# Patient Record
Sex: Male | Born: 1965 | Race: Black or African American | Hispanic: No | Marital: Single | Smoking: Current every day smoker
Health system: Southern US, Community
[De-identification: ages and names within clinical notes are randomized; demographics above are authoritative.]

## PROBLEM LIST (undated history)

## (undated) DIAGNOSIS — I10 Essential (primary) hypertension: Secondary | ICD-10-CM

## (undated) DIAGNOSIS — E7800 Pure hypercholesterolemia, unspecified: Secondary | ICD-10-CM

## (undated) DIAGNOSIS — J020 Streptococcal pharyngitis: Secondary | ICD-10-CM

## (undated) DIAGNOSIS — E119 Type 2 diabetes mellitus without complications: Secondary | ICD-10-CM

## (undated) DIAGNOSIS — T884XXA Failed or difficult intubation, initial encounter: Secondary | ICD-10-CM

## (undated) DIAGNOSIS — H409 Unspecified glaucoma: Secondary | ICD-10-CM

## (undated) DIAGNOSIS — K219 Gastro-esophageal reflux disease without esophagitis: Secondary | ICD-10-CM

## (undated) HISTORY — DX: Streptococcal pharyngitis: J02.0

## (undated) HISTORY — DX: Essential (primary) hypertension: I10

## (undated) HISTORY — PX: HERNIA REPAIR: SHX51

## (undated) HISTORY — DX: Unspecified glaucoma: H40.9

---

## 2015-06-25 ENCOUNTER — Emergency Department
Admission: EM | Admit: 2015-06-25 | Discharge: 2015-06-25 | Discharge status: Against Medical Advice | Payer: Self-pay | Attending: Emergency Medicine | Admitting: Emergency Medicine

## 2015-06-25 ENCOUNTER — Emergency Department: Payer: Self-pay

## 2015-06-25 ENCOUNTER — Encounter: Payer: Self-pay | Admitting: Urgent Care

## 2015-06-25 ENCOUNTER — Other Ambulatory Visit: Payer: Self-pay

## 2015-06-25 DIAGNOSIS — E119 Type 2 diabetes mellitus without complications: Secondary | ICD-10-CM | POA: Insufficient documentation

## 2015-06-25 DIAGNOSIS — R0602 Shortness of breath: Secondary | ICD-10-CM | POA: Insufficient documentation

## 2015-06-25 HISTORY — DX: Pure hypercholesterolemia, unspecified: E78.00

## 2015-06-25 HISTORY — DX: Type 2 diabetes mellitus without complications: E11.9

## 2015-06-25 LAB — BASIC METABOLIC PANEL
ANION GAP: 5 (ref 5–15)
BUN: 15 mg/dL (ref 6–20)
CHLORIDE: 107 mmol/L (ref 101–111)
CO2: 25 mmol/L (ref 22–32)
Calcium: 9.1 mg/dL (ref 8.9–10.3)
Creatinine, Ser: 1 mg/dL (ref 0.61–1.24)
GFR calc Af Amer: >60 mL/min (ref >60)
Glucose, Bld: 266 mg/dL — ABNORMAL HIGH (ref 65–99)
POTASSIUM: 3.8 mmol/L (ref 3.5–5.1)
SODIUM: 137 mmol/L (ref 135–145)

## 2015-06-25 LAB — CBC
HEMATOCRIT: 41.6 % (ref 40.0–52.0)
HEMOGLOBIN: 13.3 g/dL (ref 13.0–18.0)
MCH: 23.9 pg — ABNORMAL LOW (ref 26.0–34.0)
MCHC: 32 g/dL (ref 32.0–36.0)
MCV: 74.5 fL — AB (ref 80.0–100.0)
Platelets: 217 10*3/uL (ref 150–440)
RBC: 5.59 MIL/uL (ref 4.40–5.90)
RDW: 15.5 % — AB (ref 11.5–14.5)
WBC: 8.8 10*3/uL (ref 3.8–10.6)

## 2015-06-25 LAB — TROPONIN I: Troponin I: <0.03 ng/mL (ref <0.031)

## 2015-06-25 NOTE — ED Notes (Addendum)
Patient presents with c/o worsening SOB over the last few days; worse tonight. States, I been having trouble getting it to go away." Patient with NAD noted in triage; no increased WOB.

## 2016-03-19 ENCOUNTER — Encounter: Payer: Self-pay | Admitting: Emergency Medicine

## 2016-03-19 DIAGNOSIS — H6092 Unspecified otitis externa, left ear: Secondary | ICD-10-CM | POA: Insufficient documentation

## 2016-03-19 DIAGNOSIS — E119 Type 2 diabetes mellitus without complications: Secondary | ICD-10-CM | POA: Insufficient documentation

## 2016-03-19 NOTE — ED Triage Notes (Signed)
Patient ambulatory to triage with steady gait, without difficulty or distress noted; pt reports left earache x 2 days

## 2016-03-20 ENCOUNTER — Emergency Department
Admission: EM | Admit: 2016-03-20 | Discharge: 2016-03-20 | Disposition: A | Discharge status: Home / Self Care | Payer: Self-pay | Attending: Emergency Medicine | Admitting: Emergency Medicine

## 2016-03-20 DIAGNOSIS — H9202 Otalgia, left ear: Secondary | ICD-10-CM

## 2016-03-20 DIAGNOSIS — H6092 Unspecified otitis externa, left ear: Secondary | ICD-10-CM

## 2016-03-20 MED ORDER — AZITHROMYCIN 250 MG PO TABS: ORAL_TABLET | ORAL | 0 refills | Status: DC

## 2016-03-20 MED ORDER — NEOMYCIN-COLIST-HC-THONZONIUM 3.3-3-10-0.5 MG/ML OT SUSP: 4.0000 [drp] | Freq: Once | OTIC | Status: DC

## 2016-03-20 MED ORDER — AZITHROMYCIN 500 MG PO TABS
500.0000 mg | ORAL_TABLET | Freq: Once | ORAL | Status: AC
  Administered 2016-03-20: 500 mg via ORAL
  Filled 2016-03-20: qty 1

## 2016-03-20 MED ORDER — NEOMYCIN-POLYMYXIN-HC 3.5-10000-1 OT SUSP
4.0000 [drp] | Freq: Once | OTIC | Status: AC
  Administered 2016-03-20: 4 [drp] via OTIC
  Filled 2016-03-20: qty 10

## 2016-03-20 MED ORDER — NEOMYCIN-POLYMYXIN-HC 1 % OT SOLN: 3.0000 [drp] | OTIC | 0 refills | Status: DC

## 2016-03-20 MED ORDER — IBUPROFEN 800 MG PO TABS
800.0000 mg | ORAL_TABLET | Freq: Once | ORAL | Status: AC
  Administered 2016-03-20: 800 mg via ORAL
  Filled 2016-03-20: qty 1

## 2016-03-20 NOTE — ED Provider Notes (Signed)
[facility] Emergency Department Provider Note        Time seen: ----------------------------------------- 2:14 AM on 03/20/2016 -----------------------------------------    I have reviewed the triage vital signs and the nursing notes.   HISTORY  Chief Complaint Otalgia    HPI Alexander Powers is a 50 y.o. male who presents to ER for left earache for the last 2 days. Patient states he got water in it and he was trying to clean out with a Q-tip. He states pain started after that. He denies recent illness or upper respiratory symptoms. He denies fevers or other complaints.   Past Medical History:  Diagnosis Date  . Diabetes mellitus without complication (HCC)   . Hypercholesteremia     There are no active problems to display for this patient.   History reviewed. No pertinent surgical history.  Allergies Review of patient's allergies indicates no known allergies.  Social History Social History  Substance Use Topics  . Smoking status: Never Smoker  . Smokeless tobacco: Never Used  . Alcohol use No    Review of Systems Constitutional: Negative for fever. Eyes: Negative for visual changes. ENT: Positive for left earache Skin: Negative for rash. Neurological: Negative for headaches, focal weakness or numbness. ____________________________________________   PHYSICAL EXAM:  VITAL SIGNS: ED Triage Vitals  Enc Vitals Group     BP 03/19/16 2354 (!) 111/55     Pulse Rate 03/19/16 2354 73     Resp 03/19/16 2354 18     Temp 03/19/16 2354 98.2 F (36.8 C)     Temp Source 03/19/16 2354 Oral     SpO2 03/19/16 2354 97 %     Weight 03/19/16 2353 220 lb (99.8 kg)     Height 03/19/16 2353 5\' 7"  (1.702 m)     Head Circumference --      Peak Flow --      Pain Score 03/19/16 2352 9     Pain Loc --      Pain Edu? --      Excl. in GC? --     Constitutional: Alert and oriented. Well appearing and in no distress. Eyes: Conjunctivae are normal.  PERRL. Normal extraocular movements. ENT   Head: Normocephalic and atraumatic.      Ears: Significant debris and inflammation of the left ear canal, left eardrum is retracted. Right eardrum and ear canal are normal.   Nose: No congestion/rhinnorhea.   Mouth/Throat: Mucous membranes are moist.   Neck: No stridor. Neurologic:  Normal speech and language. No gross focal neurologic deficits are appreciated.  Skin:  Skin is warm, dry and intact. No rash noted. ____________________________________________  ED COURSE:  Pertinent labs & imaging results that were available during my care of the patient were reviewed by me and considered in my medical decision making (see chart for details). Clinical Course  Patient is in no acute distress, clinically he appears to have otitis externa, possibly also otitis media. We will start Corticosporin drops here as well as oral Motrin.  Procedures ___________________________________________  FINAL ASSESSMENT AND PLAN  Otitis externa, possible otitis media  Plan: Patient with findings as dictated above. He's been started on Corticosporin, Z-Pak and Motrin. He is stable for outpatient follow-up.   Emily FilbertWilliams, Ragan Reale E, MD   Note: This dictation was prepared with Dragon dictation. Any transcriptional errors that result from this process are unintentional    Emily FilbertJonathan E Malu Pellegrini, MD 03/20/16 [phone]

## 2016-03-20 NOTE — ED Notes (Signed)
Pt discharged to home.  Family member driving.  Discharge instructions reviewed.  Verbalized understanding.  No questions or concerns at this time.  Teach back verified.  Pt in NAD.  No items left in ED.   

## 2016-03-23 ENCOUNTER — Emergency Department
Admission: EM | Admit: 2016-03-23 | Discharge: 2016-03-23 | Disposition: A | Discharge status: Home / Self Care | Payer: Self-pay | Attending: Student in an Organized Health Care Education/Training Program | Admitting: Student in an Organized Health Care Education/Training Program

## 2016-03-23 DIAGNOSIS — H66002 Acute suppurative otitis media without spontaneous rupture of ear drum, left ear: Secondary | ICD-10-CM | POA: Insufficient documentation

## 2016-03-23 DIAGNOSIS — E119 Type 2 diabetes mellitus without complications: Secondary | ICD-10-CM | POA: Insufficient documentation

## 2016-03-23 DIAGNOSIS — H6092 Unspecified otitis externa, left ear: Secondary | ICD-10-CM | POA: Insufficient documentation

## 2016-03-23 MED ORDER — CIPROFLOXACIN HCL 0.3 % OP SOLN: 2.0000 [drp] | OPHTHALMIC | 0 refills | Status: DC

## 2016-03-23 MED ORDER — AMOXICILLIN-POT CLAVULANATE 875-125 MG PO TABS: 1.0000 | ORAL_TABLET | Freq: Two times a day (BID) | ORAL | 0 refills | Status: DC

## 2016-03-23 MED ORDER — TRAMADOL HCL 50 MG PO TABS: 50.0000 mg | ORAL_TABLET | Freq: Four times a day (QID) | ORAL | 0 refills | Status: DC | PRN

## 2016-03-23 MED ORDER — CIPROFLOXACIN-DEXAMETHASONE 0.3-0.1 % OT SUSP
4.0000 [drp] | Freq: Once | OTIC | Status: AC
  Administered 2016-03-23: 4 [drp] via OTIC
  Filled 2016-03-23 (×2): qty 7.5

## 2016-03-23 NOTE — ED Provider Notes (Signed)
[facility] Emergency Department Provider Note ____________________________________________  Time seen: Approximately 8:27 PM  I have reviewed the triage vital signs and the nursing notes.   HISTORY  Chief Complaint Otalgia    HPI Su Hiltimothy Aller is a 50 y.o. male who presents to the emergency department for evaluation of left ear pain. He was evaluated hereon 03/20/2016. At that time was Powers azithromycin and antibiotic eardrops. He states that he has taken the medications as Powers, but the pain in the left ear has only worsened. He denies fever. He denies loss of hearing or tinnitus.  Past Medical History:  Diagnosis Date  . Diabetes mellitus without complication (HCC)   . Hypercholesteremia     There are no active problems to display for this patient.   No past surgical history on file.  Prior to Admission medications   Medication Sig Start Date End Date Taking? Authorizing Provider  amoxicillin-clavulanate (AUGMENTIN) 875-125 MG tablet Take 1 tablet by mouth 2 (two) times daily. 03/23/16   Chinita Pesterari B Vere Diantonio, FNP  ciprofloxacin (CILOXAN) 0.3 % ophthalmic solution Place 2 drops into the right ear every 4 (four) hours while awake. 03/23/16   Chinita Pesterari B Bryanah Sidell, FNP    Allergies Review of patient's allergies indicates no known allergies.  No family history on file.  Social History Social History  Substance Use Topics  . Smoking status: Never Smoker  . Smokeless tobacco: Never Used  . Alcohol use No    Review of Systems Constitutional: Negative  for fever/chills Eyes: No visual changes. ENT: Positive  for left  earache. Gastrointestinal: No abdominal pain.  No nausea, no vomiting.  No diarrhea.  No constipation. Musculoskeletal: Negative for pain. Skin: Negative for rash. Neurological: Negative for headaches, focal weakness or numbness. ____________________________________________   PHYSICAL EXAM:  VITAL SIGNS: ED Triage Vitals  [03/23/16 2010]  Enc Vitals Group     BP (!) 152/93     Pulse Rate 70     Resp 18     Temp 98 F (36.7 C)     Temp Source Oral     SpO2 98 %     Weight 212 lb (96.2 kg)     Height 5\' 8"  (1.727 m)     Head Circumference      Peak Flow      Pain Score 9     Pain Loc      Pain Edu?      Excl. in GC?     Constitutional: Alert and oriented. Well appearing and in no acute distress. Eyes: Conjunctivae are normal. PERRL. EOMI. Ears: Significant  pain with movement of left  auricle:; External canal erythematous;  Right TM normal; Left TM erythematous, dull, intact   Head: Atraumatic. Nose: No congestion/rhinnorhea. Mouth/Throat: Mucous membranes are moist.  Oropharynx non-erythematous. Hematological/Lymphatic/Immunilogical: No cervical lymphadenopathy. Cardiovascular: Normal capillary refill. Respiratory: Normal respiratory effort.  No retractions.  Neurologic:  Normal speech and language. No gross focal neurologic deficits are appreciated. Speech is normal. No gait instability. Skin:  Skin is warm, dry and intact. No rash noted. ____________________________________________   LABS (all labs ordered are listed, but only abnormal results are displayed)  Labs Reviewed - No data to display ____________________________________________   RADIOLOGY  Not indicated  ____________________________________________   PROCEDURES  Procedure(s) performed: None  ____________________________________________   INITIAL IMPRESSION / ASSESSMENT AND PLAN / ED COURSE  Pertinent labs & imaging results that were available during my care of the patient were reviewed by me and  considered in my medical decision making (see chart for details).  Prescriptions for Cipro drops and Augmentin as well as tramadol  will be given today.  The patient was advised to follow up with ENT  for symptoms that are not improving over the next 48 hours. He  was advised to return to the emergency department for  symptoms that change or worsen if unable to schedule an appointment.  ____________________________________________   FINAL CLINICAL IMPRESSION(S) / ED DIAGNOSES  Final diagnoses:  Otitis externa, left  Acute suppurative otitis media of left ear without spontaneous rupture of tympanic membrane, recurrence not specified    Note:  This document was prepared using Dragon voice recognition software and may include unintentional dictation errors.     Chinita PesterCari B Shyvonne Chastang, FNP 03/23/16 2049    Willy EddyPatrick Robinson, MD 03/23/16 [phone]

## 2016-03-23 NOTE — ED Triage Notes (Signed)
Patient ambulatory to triage with steady gait, without difficulty or distress noted; pt c/o left earache unrelieved by meds rx here on Friday; denies any accomp c/o

## 2016-03-23 NOTE — ED Notes (Signed)
Pt had to wait to get ear drops from pharmacy

## 2016-03-23 NOTE — ED Notes (Signed)
Discharge instructions reviewed with patient. Questions fielded by this RN. Patient verbalizes understanding of instructions. Patient discharged home in stable condition per Triplett NP. No acute distress noted at time of discharge.   

## 2016-07-31 ENCOUNTER — Ambulatory Visit
Admission: EM | Admit: 2016-07-31 | Discharge: 2016-07-31 | Disposition: A | Discharge status: Home / Self Care | Payer: 59 | Attending: Family Medicine | Admitting: Family Medicine

## 2016-07-31 ENCOUNTER — Encounter: Payer: Self-pay | Admitting: Gynecology

## 2016-07-31 DIAGNOSIS — K529 Noninfective gastroenteritis and colitis, unspecified: Secondary | ICD-10-CM | POA: Diagnosis not present

## 2016-07-31 LAB — COMPREHENSIVE METABOLIC PANEL
ALBUMIN: 3.9 g/dL (ref 3.5–5.0)
ALT: 22 U/L (ref 17–63)
ANION GAP: 6 (ref 5–15)
AST: 25 U/L (ref 15–41)
Alkaline Phosphatase: 50 U/L (ref 38–126)
BUN: 14 mg/dL (ref 6–20)
CALCIUM: 8.9 mg/dL (ref 8.9–10.3)
CHLORIDE: 107 mmol/L (ref 101–111)
CO2: 25 mmol/L (ref 22–32)
Creatinine, Ser: 0.92 mg/dL (ref 0.61–1.24)
GFR calc non Af Amer: >60 mL/min (ref >60)
GLUCOSE: 127 mg/dL — AB (ref 65–99)
POTASSIUM: 3.7 mmol/L (ref 3.5–5.1)
SODIUM: 138 mmol/L (ref 135–145)
Total Bilirubin: 0.7 mg/dL (ref 0.3–1.2)
Total Protein: 7.4 g/dL (ref 6.5–8.1)

## 2016-07-31 LAB — LIPASE, BLOOD: Lipase: 26 U/L (ref 11–51)

## 2016-07-31 MED ORDER — ONDANSETRON 8 MG PO TBDP: 8.0000 mg | ORAL_TABLET | Freq: Three times a day (TID) | ORAL | 0 refills | Status: DC | PRN

## 2016-07-31 NOTE — ED Provider Notes (Signed)
[facility]    CSN: [id number] Arrival date & time: 07/31/16  1922     History   Chief Complaint Chief Complaint  Patient presents with  . Emesis  . Diarrhea    HPI Su Hiltimothy Efferson is a 50 y.o. male.    Emesis  Associated symptoms: diarrhea and myalgias   Associated symptoms: no abdominal pain, no arthralgias, no chills, no fever, no headaches and no URI   Diarrhea  Quality:  Watery Severity:  Moderate Onset quality:  Sudden Duration:  3 days Timing:  Intermittent Progression:  Unchanged Relieved by:  None tried Associated symptoms: myalgias and vomiting   Associated symptoms: no abdominal pain, no arthralgias, no chills, no recent cough, no diaphoresis, no fever, no headaches and no URI   Risk factors: sick contacts   Risk factors: no recent antibiotic use, no suspicious food intake and no travel to endemic areas     Past Medical History:  Diagnosis Date  . Diabetes mellitus without complication (HCC)   . Hypercholesteremia     There are no active problems to display for this patient.   History reviewed. No pertinent surgical history.     Home Medications    Prior to Admission medications   Medication Sig Start Date End Date Taking? Authorizing Provider  lovastatin (MEVACOR) 20 MG tablet Take 20 mg by mouth at bedtime.   Yes Historical Provider, MD  metFORMIN (GLUCOPHAGE) 1000 MG tablet Take 1,000 mg by mouth 2 (two) times daily with a meal.   Yes Historical Provider, MD  amoxicillin-clavulanate (AUGMENTIN) 875-125 MG tablet Take 1 tablet by mouth 2 (two) times daily. 03/23/16   Chinita Pesterari B Triplett, FNP  ciprofloxacin (CILOXAN) 0.3 % ophthalmic solution Place 2 drops into the right ear every 4 (four) hours while awake. 03/23/16   Chinita Pesterari B Triplett, FNP  ondansetron (ZOFRAN ODT) 8 MG disintegrating tablet Take 1 tablet (8 mg total) by mouth every 8 (eight) hours as needed for nausea or vomiting. 07/31/16   Payton Mccallumrlando Monaca Wadas, MD  traMADol (ULTRAM) 50 MG  tablet Take 1 tablet (50 mg total) by mouth every 6 (six) hours as needed. 03/23/16   Chinita Pesterari B Triplett, FNP    Family History No family history on file.  Social History Social History  Substance Use Topics  . Smoking status: Never Smoker  . Smokeless tobacco: Never Used  . Alcohol use No     Allergies   Patient has no known allergies.   Review of Systems Review of Systems  Constitutional: Negative for chills, diaphoresis and fever.  Gastrointestinal: Positive for diarrhea and vomiting. Negative for abdominal pain.  Musculoskeletal: Positive for myalgias. Negative for arthralgias.  Neurological: Negative for headaches.     Physical Exam Triage Vital Signs ED Triage Vitals  Enc Vitals Group     BP 07/31/16 1936 (!) 146/67     Pulse Rate 07/31/16 1936 89     Resp 07/31/16 1936 16     Temp 07/31/16 1936 98.6 F (37 C)     Temp Source 07/31/16 1936 Oral     SpO2 07/31/16 1936 98 %     Weight 07/31/16 1939 213 lb (96.6 kg)     Height 07/31/16 1939 5\' 7"  (1.702 m)     Head Circumference --      Peak Flow --      Pain Score 07/31/16 1942 7     Pain Loc --      Pain Edu? --  Excl. in GC? --    No data found.   Updated Vital Signs BP (!) 146/67   Pulse 89   Temp 98.6 F (37 C) (Oral)   Resp 16   Ht 5\' 7"  (1.702 m)   Wt 213 lb (96.6 kg)   SpO2 98%   BMI 33.36 kg/m   Visual Acuity Right Eye Distance:   Left Eye Distance:   Bilateral Distance:    Right Eye Near:   Left Eye Near:    Bilateral Near:     Physical Exam  Constitutional: He is oriented to person, place, and time. He appears well-developed and well-nourished. No distress.  HENT:  Head: Normocephalic and atraumatic.  Cardiovascular: Normal rate, regular rhythm, normal heart sounds and intact distal pulses.   No murmur heard. Pulmonary/Chest: Effort normal and breath sounds normal. No respiratory distress. He has no wheezes. He has no rales.  Abdominal: Soft. Bowel sounds are normal. He  exhibits no distension and no mass. There is tenderness (mild, difffuse; no rebound or guarding). There is no rebound and no guarding.  Neurological: He is alert and oriented to person, place, and time.  Skin: No rash noted. He is not diaphoretic.  Nursing note and vitals reviewed.    UC Treatments / Results  Labs (all labs ordered are listed, but only abnormal results are displayed) Labs Reviewed  COMPREHENSIVE METABOLIC PANEL - Abnormal; Notable for the following:       Result Value   Glucose, Bld 127 (*)    All other components within normal limits  LIPASE, BLOOD    EKG  EKG Interpretation None       Radiology No results found.  Procedures Procedures (including critical care time)  Medications Ordered in UC Medications - No data to display   Initial Impression / Assessment and Plan / UC Course  I have reviewed the triage vital signs and the nursing notes.  Pertinent labs & imaging results that were available during my care of the patient were reviewed by me and considered in my medical decision making (see chart for details).  Clinical Course       Final Clinical Impressions(s) / UC Diagnoses   Final diagnoses:  Gastroenteritis, acute  (likely viral)  New Prescriptions Discharge Medication List as of 07/31/2016  8:17 PM     1. Labs results and diagnosis reviewed with patient 2. rx as per orders above; reviewed possible side effects, interactions, risks and benefits  3. Recommend supportive treatment with increased fluids/clear liquids, then advance slowly as tolerated; otc imodium AD 4. Follow-up prn if symptoms worsen or don't improve   Payton Mccallumrlando Erianna Jolly, MD 07/31/16 2221

## 2016-07-31 NOTE — Discharge Instructions (Signed)
Rest, fluids (pedialyte, gatorade, water), Imodium AD Follow up as needed if symptoms worsen or don't improve

## 2016-07-31 NOTE — ED Triage Notes (Signed)
Per patient x 3 days was put on metformin when his symptoms started. Per patient after taking his metformin the first day had 3 loose bowel movement.

## 2017-04-19 ENCOUNTER — Ambulatory Visit
Admission: EM | Admit: 2017-04-19 | Discharge: 2017-04-19 | Disposition: A | Discharge status: Home / Self Care | Payer: 59 | Attending: Family Medicine | Admitting: Family Medicine

## 2017-04-19 DIAGNOSIS — M7712 Lateral epicondylitis, left elbow: Secondary | ICD-10-CM

## 2017-04-19 DIAGNOSIS — S161XXA Strain of muscle, fascia and tendon at neck level, initial encounter: Secondary | ICD-10-CM

## 2017-04-19 MED ORDER — CYCLOBENZAPRINE HCL 10 MG PO TABS: 10.0000 mg | ORAL_TABLET | Freq: Every day | ORAL | 0 refills | Status: DC

## 2017-04-19 MED ORDER — IBUPROFEN 800 MG PO TABS: 800.0000 mg | ORAL_TABLET | Freq: Three times a day (TID) | ORAL | 0 refills | Status: DC

## 2017-04-19 MED ORDER — HYDROCODONE-ACETAMINOPHEN 5-325 MG PO TABS: ORAL_TABLET | ORAL | 0 refills | Status: DC

## 2017-04-19 NOTE — ED Triage Notes (Signed)
Patient states that he is having left arm around the elbow pain that started around 3 months ago but worsened recently. Pain with lifting certain ways.

## 2017-07-27 ENCOUNTER — Other Ambulatory Visit: Payer: Self-pay

## 2017-07-27 ENCOUNTER — Ambulatory Visit
Admission: EM | Admit: 2017-07-27 | Discharge: 2017-07-27 | Disposition: A | Discharge status: Home / Self Care | Payer: Self-pay | Attending: Family Medicine | Admitting: Family Medicine

## 2017-07-27 DIAGNOSIS — K429 Umbilical hernia without obstruction or gangrene: Secondary | ICD-10-CM

## 2017-07-27 NOTE — ED Triage Notes (Signed)
Patient complains of a knot located on his belly button that he has been noticing over the last few months. Patient occasionally notices a sharp pain in the area.

## 2017-07-27 NOTE — Discharge Instructions (Signed)
Follow-up with surgery

## 2017-08-27 NOTE — ED Provider Notes (Signed)
[facility]    CSN: [id number] Arrival date & time: 04/19/17  1935     History   Chief Complaint Chief Complaint  Patient presents with  . Arm Pain    HPI Alexander Powers is a 52 y.o. male.   52 yo male with a c/o left arm/elbow pain that started about 3 months ago. Denies any falls or injuries. Worse with gripping. Also c/o upper back, neck pain. Denies any falls or other traumatic injury.    The history is provided by the patient.  Arm Pain     Past Medical History:  Diagnosis Date  . Diabetes mellitus without complication (HCC)   . Hypercholesteremia     There are no active problems to display for this patient.   Past Surgical History:  Procedure Laterality Date  . NO PAST SURGERIES         Home Medications    Prior to Admission medications   Medication Sig Start Date End Date Taking? Authorizing Provider  lovastatin (MEVACOR) 20 MG tablet Take 20 mg by mouth at bedtime.   Yes [provider]  metFORMIN (GLUCOPHAGE) 1000 MG tablet Take 1,000 mg by mouth 2 (two) times daily with a meal.   Yes [provider]  amoxicillin-clavulanate (AUGMENTIN) 875-125 MG tablet Take 1 tablet by mouth 2 (two) times daily. 03/23/16   Triplett, Rulon Eisenmenger B, FNP  ciprofloxacin (CILOXAN) 0.3 % ophthalmic solution Place 2 drops into the right ear every 4 (four) hours while awake. 03/23/16   Triplett, Rulon Eisenmenger B, FNP  cyclobenzaprine (FLEXERIL) 10 MG tablet Take 1 tablet (10 mg total) by mouth at bedtime. 04/19/17   Payton Mccallum, MD  HYDROcodone-acetaminophen (NORCO/VICODIN) 5-325 MG tablet 1-2 tabs po bid prn 04/19/17   Payton Mccallum, MD  ibuprofen (ADVIL,MOTRIN) 800 MG tablet Take 1 tablet (800 mg total) by mouth 3 (three) times daily. 04/19/17   Payton Mccallum, MD  ondansetron (ZOFRAN ODT) 8 MG disintegrating tablet Take 1 tablet (8 mg total) by mouth every 8 (eight) hours as needed for nausea or vomiting. 07/31/16   Payton Mccallum, MD  traMADol (ULTRAM) 50  MG tablet Take 1 tablet (50 mg total) by mouth every 6 (six) hours as needed. 03/23/16   Chinita Pester, FNP    Family History Family History  Problem Relation Age of Onset  . Diabetes Father     Social History Social History   Tobacco Use  . Smoking status: Never Smoker  . Smokeless tobacco: Never Used  Substance Use Topics  . Alcohol use: Yes    Comment: occasionally  . Drug use: No     Allergies   Patient has no known allergies.   Review of Systems Review of Systems   Physical Exam Triage Vital Signs ED Triage Vitals [04/19/17 2015]  Enc Vitals Group     BP (!) 157/90     Pulse Rate 87     Resp 18     Temp 98.4 F (36.9 C)     Temp Source Oral     SpO2 98 %     Weight 215 lb (97.5 kg)     Height 5' 7.5" (1.715 m)     Head Circumference      Peak Flow      Pain Score 9     Pain Loc      Pain Edu?      Excl. in GC?    No data found.  Updated Vital Signs BP (!) 157/90 (  BP Location: Left Arm)   Pulse 87   Temp 98.4 F (36.9 C) (Oral)   Resp 18   Ht 5' 7.5" (1.715 m)   Wt 215 lb (97.5 kg)   SpO2 98%   BMI 33.18 kg/m   Visual Acuity Right Eye Distance:   Left Eye Distance:   Bilateral Distance:    Right Eye Near:   Left Eye Near:    Bilateral Near:     Physical Exam  Constitutional: He appears well-developed and well-nourished. No distress.  Musculoskeletal:       Left forearm: He exhibits tenderness. He exhibits no bony tenderness, no swelling, no edema, no deformity and no laceration.  Over the lateral epicondyle  Skin: He is not diaphoretic.  Nursing note and vitals reviewed.    UC Treatments / Results  Labs (all labs ordered are listed, but only abnormal results are displayed) Labs Reviewed - No data to display  EKG  EKG Interpretation None       Radiology No results found.  Procedures Procedures (including critical care time)  Medications Ordered in UC Medications - No data to display   Initial Impression /  Assessment and Plan / UC Course  I have reviewed the triage vital signs and the nursing notes.  Pertinent labs & imaging results that were available during my care of the patient were reviewed by me and considered in my medical decision making (see chart for details).       Final Clinical Impressions(s) / UC Diagnoses   Final diagnoses:  Lateral epicondylitis of left elbow  Strain of neck muscle, initial encounter    ED Discharge Orders        Ordered    ibuprofen (ADVIL,MOTRIN) 800 MG tablet  3 times daily     04/19/17 2102    cyclobenzaprine (FLEXERIL) 10 MG tablet  Daily at bedtime     04/19/17 2102    HYDROcodone-acetaminophen (NORCO/VICODIN) 5-325 MG tablet     04/19/17 2102     1.diagnosis reviewed with patient 2. rx as per orders above; reviewed possible side effects, interactions, risks and benefits  3. Recommend supportive treatment with rest, ice 4. Follow-up prn if symptoms worsen or don't improve   Controlled Substance Prescriptions [place] Controlled Substance Registry consulted? Not Applicable   Payton Mccallumonty, Makylie Rivere, MD 08/27/17 1015

## 2017-09-13 LAB — HM HEPATITIS C SCREENING LAB: HM Hepatitis Screen: NEGATIVE

## 2017-10-07 ENCOUNTER — Encounter: Payer: Self-pay | Admitting: Surgery

## 2017-10-07 ENCOUNTER — Ambulatory Visit (INDEPENDENT_AMBULATORY_CARE_PROVIDER_SITE_OTHER): Payer: Commercial Managed Care - PPO | Admitting: Surgery

## 2017-10-07 VITALS — BP 167/105 | HR 75 | Temp 98.3°F | Ht 70.0 in | Wt 204.0 lb

## 2017-10-07 DIAGNOSIS — K429 Umbilical hernia without obstruction or gangrene: Secondary | ICD-10-CM | POA: Diagnosis not present

## 2017-10-07 NOTE — Patient Instructions (Signed)
You have requested for your Umbilical Hernia be repaired. This has been scheduled on 10/22/2017 by Dr. Satira MccallumJason Davis at [facility].  Please see your (blue)pre-care sheet for information.  You will need to arrange to be off work for 1-2 weeks but will have to have a lifting restriction of no more than 15 lbs for 6 weeks following your surgery.   Umbilical Hernia, Adult A hernia is a bulge of tissue that pushes through an opening between muscles. An umbilical hernia happens in the abdomen, near the belly button (umbilicus). The hernia may contain tissues from the small intestine, large intestine, or fatty tissue covering the intestines (omentum). Umbilical hernias in adults tend to get worse over time, and they require surgical treatment. There are several types of umbilical hernias. You may have:  A hernia located just above or below the umbilicus (indirect hernia). This is the most common type of umbilical hernia in adults.  A hernia that forms through an opening formed by the umbilicus (direct hernia).  A hernia that comes and goes (reducible hernia). A reducible hernia may be visible only when you strain, lift something heavy, or cough. This type of hernia can be pushed back into the abdomen (reduced).  A hernia that traps abdominal tissue inside the hernia (incarcerated hernia). This type of hernia cannot be reduced.  A hernia that cuts off blood flow to the tissues inside the hernia (strangulated hernia). The tissues can start to die if this happens. This type of hernia requires emergency treatment.  What are the causes? An umbilical hernia happens when tissue inside the abdomen presses on a weak area of the abdominal muscles. What increases the risk? You may have a greater risk of this condition if you:  Are obese.  Have had several pregnancies.  Have a buildup of fluid inside your abdomen (ascites).  Have had surgery that weakens the abdominal muscles.  What are the signs  or symptoms? The main symptom of this condition is a painless bulge at or near the belly button. A reducible hernia may be visible only when you strain, lift something heavy, or cough. Other symptoms may include:  Dull pain.  A feeling of pressure.  Symptoms of a strangulated hernia may include:  Pain that gets increasingly worse.  Nausea and vomiting.  Pain when pressing on the hernia.  Skin over the hernia becoming red or purple.  Constipation.  Blood in the stool.  How is this diagnosed? This condition may be diagnosed based on:  A physical exam. You may be asked to cough or strain while standing. These actions increase the pressure inside your abdomen and force the hernia through the opening in your muscles. Your health care provider may try to reduce the hernia by pressing on it.  Your symptoms and medical history.  How is this treated? Surgery is the only treatment for an umbilical hernia. Surgery for a strangulated hernia is done as soon as possible. If you have a small hernia that is not incarcerated, you may need to lose weight before having surgery. Follow these instructions at home:  Lose weight, if told by your health care provider.  Do not try to push the hernia back in.  Watch your hernia for any changes in color or size. Tell your health care provider if any changes occur.  You may need to avoid activities that increase pressure on your hernia.  Do not lift anything that is heavier than 10 lb (4.5 kg) until your health  care provider says that this is safe.  Take over-the-counter and prescription medicines only as told by your health care provider.  Keep all follow-up visits as told by your health care provider. This is important. Contact a health care provider if:  Your hernia gets larger.  Your hernia becomes painful. Get help right away if:  You develop sudden, severe pain near the area of your hernia.  You have pain as well as nausea or  vomiting.  You have pain and the skin over your hernia changes color.  You develop a fever. This information is not intended to replace advice given to you by your health care provider. Make sure you discuss any questions you have with your health care provider. Document Released: 12/27/2015 Document Revised: 03/29/2016 Document Reviewed: 12/27/2015 Elsevier Interactive Patient Education  [employer].

## 2017-10-07 NOTE — H&P (View-Only) (Signed)
Surgical Clinic History and Physical  Referring provider:  No referring provider defined for this encounter.  HISTORY OF PRESENT ILLNESS (HPI):  52 y.o. male presents for evaluation of his umbilical hernia. Patient reports he first noticed it after feeling "a pop" while working out 6 months ago, since which time he has decreased exercising due to pain/discomfort associated with his umbilical hernia. He describes his umbilical hernia has always since reduced spontaneously when he lays down (such as in bed at night), but he says he would like to have it fixed, particularly so he can play with his grandchildren without pain or fear. Patient otherwise reports his blood glucose has been better controlled since his medications were changed, and he denies abdominal distention, constipation, diarrhea, N/V, straining with urination, frequent cough, CP, or SOB. Of note, patient also says he is able to walk more than 1 - 2 blocks and up/down a flight of steps without needing to stop or experiencing any CP or SOB.  PAST MEDICAL HISTORY (PMH):  Past Medical History:  Diagnosis Date  . Diabetes mellitus without complication (HCC)   . HTN (hypertension)   . Hypercholesteremia      PAST SURGICAL HISTORY (PSH):  Past Surgical History:  Procedure Laterality Date  . NO PAST SURGERIES       MEDICATIONS:  Prior to Admission medications   Medication Sig Start Date End Date Taking? Authorizing Provider  glipiZIDE (GLUCOTROL XL) 10 MG 24 hr tablet Take 10 mg by mouth daily with breakfast.   Yes [provider]  lovastatin (MEVACOR) 20 MG tablet Take 20 mg by mouth at bedtime.   Yes [provider]  metFORMIN (GLUCOPHAGE) 1000 MG tablet Take 1,000 mg by mouth 3 (three) times daily with meals.   Yes [provider]  metoprolol tartrate (LOPRESSOR) 25 MG tablet Take 25 mg by mouth 2 (two) times daily.   Yes [provider]     ALLERGIES:  No Known Allergies   SOCIAL  HISTORY:  Social History   Socioeconomic History  . Marital status: Divorced    Spouse name: Not on file  . Number of children: Not on file  . Years of education: Not on file  . Highest education level: Not on file  Social Needs  . Financial resource strain: Not on file  . Food insecurity - worry: Not on file  . Food insecurity - inability: Not on file  . Transportation needs - medical: Not on file  . Transportation needs - non-medical: Not on file  Occupational History  . Not on file  Tobacco Use  . Smoking status: Never Smoker  . Smokeless tobacco: Never Used  Substance and Sexual Activity  . Alcohol use: Yes    Comment: occasionally  . Drug use: No  . Sexual activity: Not on file  Other Topics Concern  . Not on file  Social History Narrative  . Not on file    The patient currently resides (home / rehab facility / nursing home): Home The patient normally is (ambulatory / bedbound): Ambulatory  FAMILY HISTORY:  Family History  Problem Relation Age of Onset  . Diabetes Father     Otherwise negative/non-contributory.  REVIEW OF SYSTEMS:  Constitutional: denies any other weight loss, fever, chills, or sweats  Eyes: denies any other vision changes, history of eye injury  ENT: denies sore throat, hearing problems  Respiratory: denies shortness of breath, wheezing  Cardiovascular: denies chest pain, palpitations  Gastrointestinal: abdominal pain, N/V, and bowel  function as per HPI Musculoskeletal: denies any other joint pains or cramps  Skin: Denies any other rashes or skin discolorations Neurological: denies any other headache, dizziness, weakness  Psychiatric: Denies any other depression, anxiety   All other review of systems were otherwise negative   VITAL SIGNS:  @VSRANGES @     Height: 5\' 10"  (177.8 cm) Weight: 204 lb (92.5 kg) BMI (Calculated): 29.27   PHYSICAL EXAM:  Constitutional:  -- Average body habitus  -- Awake, alert, and oriented x3  Eyes:  --  Pupils equally round and reactive to light  -- No scleral icterus  Ear, nose, throat:  -- No jugular venous distension -- No nasal drainage, bleeding Pulmonary:  -- No crackles  -- Equal breath sounds bilaterally -- Breathing non-labored at rest Cardiovascular:  -- S1, S2 present  -- No pericardial rubs  Gastrointestinal:  -- Easily reducible moderately tender umbilical hernia -- Abdomen soft, otherwise nontender, and non-distended without guarding/rebound tenderness -- No other abdominal masses appreciated, pulsatile or otherwise  Musculoskeletal and Integumentary:  -- Wounds or skin discoloration: None appreciated -- Extremities: B/L UE and LE FROM, hands and feet warm, no edema  Neurologic:  -- Motor function: Intact and symmetric -- Sensation: Intact and symmetric  Labs:  CBC:  Lab Results  Component Value Date   WBC 8.8 06/25/2015   RBC 5.59 06/25/2015   BMP:  Lab Results  Component Value Date   GLUCOSE 127 (H) 07/31/2016   CO2 25 07/31/2016   BUN 14 07/31/2016   CREATININE 0.92 07/31/2016   CALCIUM 8.9 07/31/2016     Imaging studies: No pertinent imaging studies available for review   Assessment/Plan:  52 y.o. male with a symptomatic reducible umbilical hernia, complicated by co-morbidities including DM, HTN, and hypercholesterolemia.   - reviewed strategies regarding manual self-reduction of umbilical hernia  - signs and symptoms of hernia incarceration and bowel obstruction discussed  - all risks, benefits, and alternatives to open repair of symptomatic reducible umbilical hernia with mesh were discussed with the patient and his wife, all of their questions were answered to their expressed satisfaction, patient expresses he wishes to proceed, and informed consent was obtained.  - will plan for open repair of supraumbilical hernia with mesh on Friday, March 15th per patient request  - anticipate return to clinic 2 weeks after above elective procedure  -  instructed to call if any questions or concerns  All of the above recommendations were discussed with the patient and patient's wife, and all of patient's and family's questions were answered to their expressed satisfaction.  Thank you for the opportunity to participate in this patient's care.  -- Scherrie Gerlach Earlene Plater, MD, RPVI : [facility] General [facility] - Partnering for exceptional care. Office: [phone]

## 2017-10-07 NOTE — Progress Notes (Signed)
Surgical Clinic History and Physical  Referring provider:  No referring provider defined for this encounter.  HISTORY OF PRESENT ILLNESS (HPI):  52 y.o. male presents for evaluation of his umbilical hernia. Patient reports he first noticed it after feeling "a pop" while working out 6 months ago, since which time he has decreased exercising due to pain/discomfort associated with his umbilical hernia. He describes his umbilical hernia has always since reduced spontaneously when he lays down (such as in bed at night), but he says he would like to have it fixed, particularly so he can play with his grandchildren without pain or fear. Patient otherwise reports his blood glucose has been better controlled since his medications were changed, and he denies abdominal distention, constipation, diarrhea, N/V, straining with urination, frequent cough, CP, or SOB. Of note, patient also says he is able to walk more than 1 - 2 blocks and up/down a flight of steps without needing to stop or experiencing any CP or SOB.  PAST MEDICAL HISTORY (PMH):  Past Medical History:  Diagnosis Date  . Diabetes mellitus without complication (HCC)   . HTN (hypertension)   . Hypercholesteremia      PAST SURGICAL HISTORY (PSH):  Past Surgical History:  Procedure Laterality Date  . NO PAST SURGERIES       MEDICATIONS:  Prior to Admission medications   Medication Sig Start Date End Date Taking? Authorizing Provider  glipiZIDE (GLUCOTROL XL) 10 MG 24 hr tablet Take 10 mg by mouth daily with breakfast.   Yes [provider]  lovastatin (MEVACOR) 20 MG tablet Take 20 mg by mouth at bedtime.   Yes [provider]  metFORMIN (GLUCOPHAGE) 1000 MG tablet Take 1,000 mg by mouth 3 (three) times daily with meals.   Yes [provider]  metoprolol tartrate (LOPRESSOR) 25 MG tablet Take 25 mg by mouth 2 (two) times daily.   Yes [provider]     ALLERGIES:  No Known Allergies   SOCIAL  HISTORY:  Social History   Socioeconomic History  . Marital status: Divorced    Spouse name: Not on file  . Number of children: Not on file  . Years of education: Not on file  . Highest education level: Not on file  Social Needs  . Financial resource strain: Not on file  . Food insecurity - worry: Not on file  . Food insecurity - inability: Not on file  . Transportation needs - medical: Not on file  . Transportation needs - non-medical: Not on file  Occupational History  . Not on file  Tobacco Use  . Smoking status: Never Smoker  . Smokeless tobacco: Never Used  Substance and Sexual Activity  . Alcohol use: Yes    Comment: occasionally  . Drug use: No  . Sexual activity: Not on file  Other Topics Concern  . Not on file  Social History Narrative  . Not on file    The patient currently resides (home / rehab facility / nursing home): Home The patient normally is (ambulatory / bedbound): Ambulatory  FAMILY HISTORY:  Family History  Problem Relation Age of Onset  . Diabetes Father     Otherwise negative/non-contributory.  REVIEW OF SYSTEMS:  Constitutional: denies any other weight loss, fever, chills, or sweats  Eyes: denies any other vision changes, history of eye injury  ENT: denies sore throat, hearing problems  Respiratory: denies shortness of breath, wheezing  Cardiovascular: denies chest pain, palpitations  Gastrointestinal: abdominal pain, N/V, and bowel  function as per HPI Musculoskeletal: denies any other joint pains or cramps  Skin: Denies any other rashes or skin discolorations Neurological: denies any other headache, dizziness, weakness  Psychiatric: Denies any other depression, anxiety   All other review of systems were otherwise negative   VITAL SIGNS:  @VSRANGES @     Height: 5\' 10"  (177.8 cm) Weight: 204 lb (92.5 kg) BMI (Calculated): 29.27   PHYSICAL EXAM:  Constitutional:  -- Average body habitus  -- Awake, alert, and oriented x3  Eyes:  --  Pupils equally round and reactive to light  -- No scleral icterus  Ear, nose, throat:  -- No jugular venous distension -- No nasal drainage, bleeding Pulmonary:  -- No crackles  -- Equal breath sounds bilaterally -- Breathing non-labored at rest Cardiovascular:  -- S1, S2 present  -- No pericardial rubs  Gastrointestinal:  -- Easily reducible moderately tender umbilical hernia -- Abdomen soft, otherwise nontender, and non-distended without guarding/rebound tenderness -- No other abdominal masses appreciated, pulsatile or otherwise  Musculoskeletal and Integumentary:  -- Wounds or skin discoloration: None appreciated -- Extremities: B/L UE and LE FROM, hands and feet warm, no edema  Neurologic:  -- Motor function: Intact and symmetric -- Sensation: Intact and symmetric  Labs:  CBC:  Lab Results  Component Value Date   WBC 8.8 06/25/2015   RBC 5.59 06/25/2015   BMP:  Lab Results  Component Value Date   GLUCOSE 127 (H) 07/31/2016   CO2 25 07/31/2016   BUN 14 07/31/2016   CREATININE 0.92 07/31/2016   CALCIUM 8.9 07/31/2016     Imaging studies: No pertinent imaging studies available for review   Assessment/Plan:  52 y.o. male with a symptomatic reducible umbilical hernia, complicated by co-morbidities including DM, HTN, and hypercholesterolemia.   - reviewed strategies regarding manual self-reduction of umbilical hernia  - signs and symptoms of hernia incarceration and bowel obstruction discussed  - all risks, benefits, and alternatives to open repair of symptomatic reducible umbilical hernia with mesh were discussed with the patient and his wife, all of their questions were answered to their expressed satisfaction, patient expresses he wishes to proceed, and informed consent was obtained.  - will plan for open repair of supraumbilical hernia with mesh on Friday, March 15th per patient request  - anticipate return to clinic 2 weeks after above elective procedure  -  instructed to call if any questions or concerns  All of the above recommendations were discussed with the patient and patient's wife, and all of patient's and family's questions were answered to their expressed satisfaction.  Thank you for the opportunity to participate in this patient's care.  -- Scherrie Gerlach Earlene Plater, MD, RPVI [place]: [facility] General Surgery - Partnering for exceptional care. Office: [phone]

## 2017-10-08 ENCOUNTER — Encounter: Payer: Self-pay | Admitting: Surgery

## 2017-10-12 ENCOUNTER — Telehealth: Payer: Self-pay | Admitting: Surgery

## 2017-10-12 NOTE — Telephone Encounter (Signed)
I have called patient and significant other to discuss surgery date. Both phone lines were busy. I was not able to leave a message.   pre op date/time and sx date. Sx: 10/22/17 with Dr Manfred Shirtsavis--open umbilical hernia repair with mesh.  Pre op: 10/15/17 between 9-1:00-phone interview.   Patient made aware to call [phone], between 1-3:00pm the day before surgery, to find out what time to arrive.

## 2017-10-14 ENCOUNTER — Ambulatory Visit
Admission: EM | Admit: 2017-10-14 | Discharge: 2017-10-14 | Disposition: A | Discharge status: Home / Self Care | Payer: Commercial Managed Care - PPO | Attending: Family Medicine | Admitting: Family Medicine

## 2017-10-14 DIAGNOSIS — K429 Umbilical hernia without obstruction or gangrene: Secondary | ICD-10-CM | POA: Diagnosis not present

## 2017-10-14 MED ORDER — ONDANSETRON 8 MG PO TBDP: 8.0000 mg | ORAL_TABLET | Freq: Two times a day (BID) | ORAL | 0 refills | Status: DC

## 2017-10-14 NOTE — ED Triage Notes (Signed)
Reports umbilical hernia for "6 months".  When eats makes him sick to stomach.  Family reports noticing it a "little more poked out".   Has surgical apppointment 3/15.

## 2017-10-14 NOTE — ED Provider Notes (Signed)
[facility]    CSN: [id number] Arrival date & time: 10/14/17  1825     History   Chief Complaint Chief Complaint  Patient presents with  . Abdominal Pain    HPI Alexander Powers is a 52 y.o. male.   HPI  52 year old male presents for local hernia that has had for 6 months.  Been evaluated and scheduled for surgery by Dr. Earlene Plateravis general surgeon for 10/22/2017.  Recently he states whenever he eats makes him sick to his stomach.  Family reports that his herniation appears to be poked out more.  Not specifically complain of pain.  No diarrhea or change in his bowel movements.  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  Had no fever or chills.  He is afebrile today.      Past Medical History:  Diagnosis Date  . Diabetes mellitus without complication (HCC)   . HTN (hypertension)   . Hypercholesteremia     There are no active problems to display for this patient.   Past Surgical History:  Procedure Laterality Date  . NO PAST SURGERIES         Home Medications    Prior to Admission medications   Medication Sig Start Date End Date Taking? Authorizing Provider  glipiZIDE (GLUCOTROL XL) 10 MG 24 hr tablet Take 10 mg by mouth daily with breakfast.   Yes [provider]  lovastatin (MEVACOR) 20 MG tablet Take 20 mg by mouth at bedtime.   Yes [provider]  metFORMIN (GLUCOPHAGE) 1000 MG tablet Take 1,000 mg by mouth 3 (three) times daily with meals.   Yes [provider]  ondansetron (ZOFRAN ODT) 8 MG disintegrating tablet Take 1 tablet (8 mg total) by mouth 2 (two) times daily. 10/14/17   Lutricia Feiloemer, Amellia Panik P, PA-C    Family History Family History  Problem Relation Age of Onset  . Diabetes Father     Social History Social History   Tobacco Use  . Smoking status: Never Smoker  . Smokeless tobacco: Never Used  Substance Use Topics  . Alcohol use: Yes    Comment: occasionally  . Drug use: No     Allergies   Patient has no known allergies.   Review of Systems Review of Systems  Constitutional: Positive for activity change. Negative for appetite change, chills, fatigue and fever.  Gastrointestinal: Positive for abdominal pain, nausea and vomiting. Negative for abdominal distention, constipation and diarrhea.  All other systems reviewed and are negative.    Physical Exam Triage Vital Signs ED Triage Vitals  Enc Vitals Group     BP 10/14/17 1857 (!) 150/89     Pulse Rate 10/14/17 1857 85     Resp 10/14/17 1857 16     Temp 10/14/17 1857 98.3 F (36.8 C)     Temp Source 10/14/17 1857 Oral     SpO2 10/14/17 1857 98 %     Weight --      Height --      Head Circumference --      Peak Flow --      Pain Score 10/14/17 1859 8     Pain Loc --      Pain Edu? --      Excl. in GC? --    No data found.  Updated Vital Signs BP (!) 150/89   Pulse 85   Temp 98.3 F (36.8 C) (  Oral)   Resp 16   SpO2 98%   Visual Acuity Right Eye Distance:   Left Eye Distance:   Bilateral Distance:    Right Eye Near:   Left Eye Near:    Bilateral Near:     Physical Exam  Constitutional: He is oriented to person, place, and time. He appears well-developed and well-nourished.  Non-toxic appearance. He does not appear ill. No distress.  HENT:  Head: Normocephalic.  Eyes: Pupils are equal, round, and reactive to light.  Abdominal: Soft. Normal appearance and bowel sounds are normal. There is tenderness in the periumbilical area. There is no rigidity, no rebound, no guarding and negative Murphy's sign. A hernia is present.  Neurological: He is alert and oriented to person, place, and time.  Skin: Skin is warm and dry.  Psychiatric: He has a normal mood and affect. His behavior is normal.  Nursing note and vitals reviewed.    UC Treatments / Results  Labs (all labs ordered are listed, but only abnormal results are displayed) Labs Reviewed  - No data to display  EKG  EKG Interpretation None       Radiology No results found.  Procedures Procedures (including critical care time)  Medications Ordered in UC Medications - No data to display   Initial Impression / Assessment and Plan / UC Course  I have reviewed the triage vital signs and the nursing notes.  Pertinent labs & imaging results that were available during my care of the patient were reviewed by me and considered in my medical decision making (see chart for details).     Plan: 1. Test/x-ray results and diagnosis reviewed with patient 2. rx as per orders; risks, benefits, potential side effects reviewed with patient 3. Recommend supportive treatment with Zofran for nausea as needed.  Notify your surgeon tomorrow of your symptoms.  Have continuing and unrelenting belly pain go emergency room immediately. 4. F/u prn if symptoms worsen or don't improve   Final Clinical Impressions(s) / UC Diagnoses   Final diagnoses:  Umbilical hernia without obstruction and without gangrene    ED Discharge Orders        Ordered    ondansetron (ZOFRAN ODT) 8 MG disintegrating tablet  2 times daily     10/14/17 1951       Controlled Substance Prescriptions [place] Controlled Substance Registry consulted? Not Applicable   Lutricia Feil, PA-C 10/14/17 2013

## 2017-10-14 NOTE — ED Notes (Signed)
Pt in treatment room.  In NAD.  Needs address.  Pt/Fam updated on POC.   

## 2017-10-14 NOTE — Discharge Instructions (Signed)
Contact your surgeon tomorrow notify him of your symptoms.

## 2017-10-15 ENCOUNTER — Encounter
Admission: RE | Admit: 2017-10-15 | Discharge: 2017-10-15 | Disposition: A | Discharge status: Home / Self Care | Payer: Commercial Managed Care - PPO | Source: Ambulatory Visit | Attending: Surgery | Admitting: Surgery

## 2017-10-15 ENCOUNTER — Other Ambulatory Visit: Payer: Self-pay

## 2017-10-15 HISTORY — DX: Gastro-esophageal reflux disease without esophagitis: K21.9

## 2017-10-15 NOTE — Patient Instructions (Signed)
Your procedure is scheduled on: 10-22-17 FRIDAY Report to Same Day Surgery 2nd floor medical mall [facility](Medical Mall Entrance-take elevator on left to 2nd floor.  Check in with surgery information desk.) To find out your arrival time please call [phone] between 1PM - 3PM on 10-21-17 THURSDAY  Remember: Instructions that are not followed completely may result in serious medical risk, up to and including death, or upon the discretion of your surgeon and anesthesiologist your surgery may need to be rescheduled.    _x___ 1. Do not eat food after midnight the night before your procedure. You may drink clear liquids up to 2 hours before you are scheduled to arrive at the hospital for your procedure.  Do not drink clear liquids within 2 hours of your scheduled arrival to the hospital.  Clear liquids include  --Water or Apple juice without pulp  --Clear carbohydrate beverage such as ClearFast or Gatorade  --Black Coffee or Clear Tea (No milk, no creamers, do not add anything to the coffee or Tea Type 1 and type 2 diabetics should only drink water.  No gum chewing or hard candies.     __x__ 2. No Alcohol for 24 hours before or after surgery.   __x__3. No Smoking or e-cigarettes for 24 prior to surgery.  Do not use any chewable tobacco products for at least 6 hour prior to surgery   ____  4. Bring all medications with you on the day of surgery if instructed.    __x__ 5. Notify your doctor if there is any change in your medical condition     (cold, fever, infections).    x___6. On the morning of surgery brush your teeth with toothpaste and water.  You may rinse your mouth with mouth wash if you wish.  Do not swallow any toothpaste or mouthwash.   Do not wear jewelry, make-up, hairpins, clips or nail polish.  Do not wear lotions, powders, or perfumes. You may wear deodorant.  Do not shave 48 hours prior to surgery. Men may shave face and neck.  Do not bring valuables to the hospital.    [facility] is not responsible for any belongings or valuables.               Contacts, dentures or bridgework may not be worn into surgery.  Leave your suitcase in the car. After surgery it may be brought to your room.  For patients admitted to the hospital, discharge time is determined by your treatment team.  _  Patients discharged the day of surgery will not be allowed to drive home.  You will need someone to drive you home and stay with you the night of your procedure.    Please read over the following fact sheets that you were given:   [facility] Preparing for [facility] and or MRSA Information   _x___ TAKE THE FOLLOWING MEDICATION THE MORNING OF SURGERY WITH A SMALL SIP OF WATER. These include:  1. NONE  2.  3.  4.  5.  6.  ____Fleets enema or Magnesium Citrate as directed.   _x___ Use CHG Soap or sage wipes as directed on instruction sheet   ____ Use inhalers on the day of surgery and bring to hospital day of surgery  _X___ Stop Metformin 2 days prior to surgery-LAST DOSE ON Tuesday, MARCH 12TH    ____ Take 1/2 of usual insulin dose the night before surgery and none on the morning surgery.   ____ Follow recommendations from Cardiologist,  Pulmonologist or PCP regarding  stopping Aspirin, Coumadin, Plavix ,Eliquis, Effient, or Pradaxa, and Pletal.  X____Stop Anti-inflammatories such as Advil, Aleve, Ibuprofen, Motrin, Naproxen, Naprosyn, Goodies powders or aspirin products NOW-OK to take Tylenol    ____ Stop supplements until after surgery   ____ Bring C-Pap to the hospital.

## 2017-10-15 NOTE — Telephone Encounter (Signed)
Patient has called back and all information has been given. Patient understands all information.   A new update phone number has entered and I have advised patient to call Pre Admission testing today.

## 2017-10-19 ENCOUNTER — Encounter
Admission: RE | Admit: 2017-10-19 | Discharge: 2017-10-19 | Disposition: A | Discharge status: Home / Self Care | Payer: Commercial Managed Care - PPO | Source: Ambulatory Visit | Attending: Surgery | Admitting: Surgery

## 2017-10-19 ENCOUNTER — Ambulatory Visit
Admission: RE | Admit: 2017-10-19 | Discharge: 2017-10-19 | Disposition: A | Discharge status: Home / Self Care | Payer: Commercial Managed Care - PPO | Source: Ambulatory Visit | Attending: Surgery | Admitting: Surgery

## 2017-10-19 DIAGNOSIS — Z01810 Encounter for preprocedural cardiovascular examination: Secondary | ICD-10-CM | POA: Insufficient documentation

## 2017-10-19 DIAGNOSIS — J9811 Atelectasis: Secondary | ICD-10-CM | POA: Diagnosis not present

## 2017-10-19 DIAGNOSIS — Q791 Other congenital malformations of diaphragm: Secondary | ICD-10-CM | POA: Insufficient documentation

## 2017-10-19 DIAGNOSIS — Z01811 Encounter for preprocedural respiratory examination: Secondary | ICD-10-CM

## 2017-10-19 DIAGNOSIS — Z01812 Encounter for preprocedural laboratory examination: Secondary | ICD-10-CM | POA: Insufficient documentation

## 2017-10-19 DIAGNOSIS — K429 Umbilical hernia without obstruction or gangrene: Secondary | ICD-10-CM | POA: Diagnosis not present

## 2017-10-19 DIAGNOSIS — Z01818 Encounter for other preprocedural examination: Secondary | ICD-10-CM | POA: Diagnosis not present

## 2017-10-19 LAB — CBC WITH DIFFERENTIAL/PLATELET
Basophils Absolute: 0 10*3/uL (ref 0–0.1)
Basophils Relative: 0 %
EOS PCT: 2 %
Eosinophils Absolute: 0.1 10*3/uL (ref 0–0.7)
HCT: 43.2 % (ref 40.0–52.0)
HEMOGLOBIN: 14 g/dL (ref 13.0–18.0)
LYMPHS PCT: 24 %
Lymphs Abs: 1.7 10*3/uL (ref 1.0–3.6)
MCH: 24.8 pg — AB (ref 26.0–34.0)
MCHC: 32.4 g/dL (ref 32.0–36.0)
MCV: 76.6 fL — AB (ref 80.0–100.0)
MONOS PCT: 6 %
Monocytes Absolute: 0.4 10*3/uL (ref 0.2–1.0)
Neutro Abs: 4.9 10*3/uL (ref 1.4–6.5)
Neutrophils Relative %: 68 %
PLATELETS: 273 10*3/uL (ref 150–440)
RBC: 5.63 MIL/uL (ref 4.40–5.90)
RDW: 14.5 % (ref 11.5–14.5)
WBC: 7.2 10*3/uL (ref 3.8–10.6)

## 2017-10-19 LAB — BASIC METABOLIC PANEL
Anion gap: 6 (ref 5–15)
BUN: 11 mg/dL (ref 6–20)
CHLORIDE: 104 mmol/L (ref 101–111)
CO2: 28 mmol/L (ref 22–32)
CREATININE: 0.86 mg/dL (ref 0.61–1.24)
Calcium: 9.1 mg/dL (ref 8.9–10.3)
GFR calc Af Amer: >60 mL/min (ref >60)
GFR calc non Af Amer: >60 mL/min (ref >60)
Glucose, Bld: 142 mg/dL — ABNORMAL HIGH (ref 65–99)
POTASSIUM: 3.6 mmol/L (ref 3.5–5.1)
SODIUM: 138 mmol/L (ref 135–145)

## 2017-10-19 LAB — HEMOGLOBIN A1C
Hgb A1c MFr Bld: 11.8 % — ABNORMAL HIGH (ref 4.8–5.6)
Mean Plasma Glucose: 291.96 mg/dL

## 2017-10-21 MED ORDER — CEFAZOLIN SODIUM-DEXTROSE 2-4 GM/100ML-% IV SOLN: 2.0000 g | INTRAVENOUS | Status: DC

## 2017-10-21 NOTE — Pre-Procedure Instructions (Signed)
CALLED DR Priscella MannPENWARDEN REGARDING CXR THAT SHOWED LEFT HEMIDIAGHRAGM ELEVATION WITH LEFT MILD BASILAR ATELECTASIS-OK TO PROCEED PER DR Priscella MannPENWARDEN

## 2017-10-22 ENCOUNTER — Encounter: Payer: Self-pay | Admitting: *Deleted

## 2017-10-22 ENCOUNTER — Ambulatory Visit: Payer: Commercial Managed Care - PPO | Admitting: Anesthesiology

## 2017-10-22 ENCOUNTER — Other Ambulatory Visit: Payer: Self-pay

## 2017-10-22 ENCOUNTER — Encounter
Admission: RE | Disposition: A | Discharge status: Home / Self Care | Payer: Self-pay | Source: Ambulatory Visit | Attending: Surgery

## 2017-10-22 ENCOUNTER — Ambulatory Visit
Admission: RE | Admit: 2017-10-22 | Discharge: 2017-10-22 | Disposition: A | Discharge status: Home / Self Care | Payer: Commercial Managed Care - PPO | Source: Ambulatory Visit | Attending: Surgery | Admitting: Surgery

## 2017-10-22 DIAGNOSIS — Z7984 Long term (current) use of oral hypoglycemic drugs: Secondary | ICD-10-CM | POA: Insufficient documentation

## 2017-10-22 DIAGNOSIS — E7800 Pure hypercholesterolemia, unspecified: Secondary | ICD-10-CM | POA: Diagnosis not present

## 2017-10-22 DIAGNOSIS — Z87891 Personal history of nicotine dependence: Secondary | ICD-10-CM | POA: Diagnosis not present

## 2017-10-22 DIAGNOSIS — Z6831 Body mass index (BMI) 31.0-31.9, adult: Secondary | ICD-10-CM | POA: Insufficient documentation

## 2017-10-22 DIAGNOSIS — I10 Essential (primary) hypertension: Secondary | ICD-10-CM | POA: Diagnosis not present

## 2017-10-22 DIAGNOSIS — E119 Type 2 diabetes mellitus without complications: Secondary | ICD-10-CM | POA: Diagnosis not present

## 2017-10-22 DIAGNOSIS — E669 Obesity, unspecified: Secondary | ICD-10-CM | POA: Diagnosis not present

## 2017-10-22 DIAGNOSIS — K219 Gastro-esophageal reflux disease without esophagitis: Secondary | ICD-10-CM | POA: Insufficient documentation

## 2017-10-22 DIAGNOSIS — Z79899 Other long term (current) drug therapy: Secondary | ICD-10-CM | POA: Diagnosis not present

## 2017-10-22 DIAGNOSIS — K429 Umbilical hernia without obstruction or gangrene: Secondary | ICD-10-CM

## 2017-10-22 HISTORY — PX: INSERTION OF MESH: SHX5868

## 2017-10-22 HISTORY — PX: UMBILICAL HERNIA REPAIR: SHX196

## 2017-10-22 HISTORY — DX: Failed or difficult intubation, initial encounter: T88.4XXA

## 2017-10-22 LAB — GLUCOSE, CAPILLARY
Glucose-Capillary: 161 mg/dL — ABNORMAL HIGH (ref 65–99)
Glucose-Capillary: 205 mg/dL — ABNORMAL HIGH (ref 65–99)

## 2017-10-22 SURGERY — REPAIR, HERNIA, UMBILICAL, ADULT
Anesthesia: General | Site: Abdomen | Wound class: Clean

## 2017-10-22 MED ORDER — CHLORHEXIDINE GLUCONATE CLOTH 2 % EX PADS: 6.0000 | MEDICATED_PAD | Freq: Once | CUTANEOUS | Status: DC

## 2017-10-22 MED ORDER — ROCURONIUM BROMIDE 100 MG/10ML IV SOLN
INTRAVENOUS | Status: DC | PRN
  Administered 2017-10-22: 20 mg via INTRAVENOUS
  Administered 2017-10-22: 50 mg via INTRAVENOUS

## 2017-10-22 MED ORDER — OXYCODONE HCL 5 MG PO TABS
5.0000 mg | ORAL_TABLET | Freq: Once | ORAL | Status: AC | PRN
  Administered 2017-10-22: 5 mg via ORAL

## 2017-10-22 MED ORDER — CEFAZOLIN SODIUM-DEXTROSE 2-4 GM/100ML-% IV SOLN
INTRAVENOUS | Status: AC
  Filled 2017-10-22: qty 100

## 2017-10-22 MED ORDER — ROCURONIUM BROMIDE 50 MG/5ML IV SOLN
INTRAVENOUS | Status: AC
  Filled 2017-10-22: qty 1

## 2017-10-22 MED ORDER — ACETAMINOPHEN 500 MG PO TABS
ORAL_TABLET | ORAL | Status: AC
  Administered 2017-10-22: 1000 mg via ORAL
  Filled 2017-10-22: qty 2

## 2017-10-22 MED ORDER — FAMOTIDINE 20 MG PO TABS
20.0000 mg | ORAL_TABLET | Freq: Once | ORAL | Status: AC
  Administered 2017-10-22: 20 mg via ORAL

## 2017-10-22 MED ORDER — GABAPENTIN 300 MG PO CAPS
ORAL_CAPSULE | ORAL | Status: AC
Start: 2017-10-22 — End: 2017-10-22
  Administered 2017-10-22: 300 mg via ORAL
  Filled 2017-10-22: qty 1

## 2017-10-22 MED ORDER — FENTANYL CITRATE (PF) 100 MCG/2ML IJ SOLN
INTRAMUSCULAR | Status: AC
  Filled 2017-10-22: qty 2

## 2017-10-22 MED ORDER — MIDAZOLAM HCL 2 MG/2ML IJ SOLN
INTRAMUSCULAR | Status: AC
  Filled 2017-10-22: qty 2

## 2017-10-22 MED ORDER — GABAPENTIN 300 MG PO CAPS
300.0000 mg | ORAL_CAPSULE | ORAL | Status: AC
  Administered 2017-10-22: 300 mg via ORAL

## 2017-10-22 MED ORDER — SUGAMMADEX SODIUM 200 MG/2ML IV SOLN
INTRAVENOUS | Status: AC
  Filled 2017-10-22: qty 2

## 2017-10-22 MED ORDER — BUPIVACAINE HCL (PF) 0.5 % IJ SOLN
INTRAMUSCULAR | Status: AC
  Filled 2017-10-22: qty 30

## 2017-10-22 MED ORDER — PROPOFOL 10 MG/ML IV BOLUS
INTRAVENOUS | Status: AC
  Filled 2017-10-22: qty 20

## 2017-10-22 MED ORDER — PHENYLEPHRINE HCL 10 MG/ML IJ SOLN
INTRAMUSCULAR | Status: AC
  Filled 2017-10-22: qty 1

## 2017-10-22 MED ORDER — SUGAMMADEX SODIUM 200 MG/2ML IV SOLN
INTRAVENOUS | Status: DC | PRN
  Administered 2017-10-22: 200 mg via INTRAVENOUS

## 2017-10-22 MED ORDER — ONDANSETRON HCL 4 MG/2ML IJ SOLN
INTRAMUSCULAR | Status: DC | PRN
  Administered 2017-10-22: 4 mg via INTRAVENOUS

## 2017-10-22 MED ORDER — ACETAMINOPHEN 500 MG PO TABS
1000.0000 mg | ORAL_TABLET | ORAL | Status: AC
  Administered 2017-10-22: 1000 mg via ORAL

## 2017-10-22 MED ORDER — LIDOCAINE HCL 1 % IJ SOLN
INTRAMUSCULAR | Status: DC | PRN
  Administered 2017-10-22: 10 mL

## 2017-10-22 MED ORDER — FENTANYL CITRATE (PF) 100 MCG/2ML IJ SOLN
25.0000 ug | INTRAMUSCULAR | Status: DC | PRN
  Administered 2017-10-22 (×3): 25 ug via INTRAVENOUS

## 2017-10-22 MED ORDER — EPHEDRINE SULFATE 50 MG/ML IJ SOLN
INTRAMUSCULAR | Status: DC | PRN
  Administered 2017-10-22: 10 mg via INTRAVENOUS

## 2017-10-22 MED ORDER — PROPOFOL 10 MG/ML IV BOLUS
INTRAVENOUS | Status: DC | PRN
  Administered 2017-10-22: 200 mg via INTRAVENOUS

## 2017-10-22 MED ORDER — OXYCODONE HCL 5 MG/5ML PO SOLN: 5.0000 mg | Freq: Once | ORAL | Status: AC | PRN

## 2017-10-22 MED ORDER — PHENYLEPHRINE HCL 10 MG/ML IJ SOLN
INTRAMUSCULAR | Status: DC | PRN
  Administered 2017-10-22: 100 ug via INTRAVENOUS

## 2017-10-22 MED ORDER — OXYCODONE HCL 5 MG PO TABS
ORAL_TABLET | ORAL | Status: AC
  Filled 2017-10-22: qty 1

## 2017-10-22 MED ORDER — FENTANYL CITRATE (PF) 100 MCG/2ML IJ SOLN
INTRAMUSCULAR | Status: DC | PRN
  Administered 2017-10-22: 25 ug via INTRAVENOUS
  Administered 2017-10-22: 100 ug via INTRAVENOUS
  Administered 2017-10-22: 50 ug via INTRAVENOUS
  Administered 2017-10-22: 25 ug via INTRAVENOUS
  Administered 2017-10-22: 50 ug via INTRAVENOUS

## 2017-10-22 MED ORDER — LIDOCAINE HCL (CARDIAC) 20 MG/ML IV SOLN
INTRAVENOUS | Status: DC | PRN
  Administered 2017-10-22: 40 mg via INTRAVENOUS

## 2017-10-22 MED ORDER — FENTANYL CITRATE (PF) 100 MCG/2ML IJ SOLN
INTRAMUSCULAR | Status: AC
  Administered 2017-10-22: 25 ug via INTRAVENOUS
  Filled 2017-10-22: qty 2

## 2017-10-22 MED ORDER — LIDOCAINE HCL (PF) 1 % IJ SOLN
INTRAMUSCULAR | Status: AC
  Filled 2017-10-22: qty 30

## 2017-10-22 MED ORDER — SODIUM CHLORIDE 0.9 % IV SOLN: INTRAVENOUS | Status: DC

## 2017-10-22 MED ORDER — MIDAZOLAM HCL 2 MG/2ML IJ SOLN
INTRAMUSCULAR | Status: DC | PRN
  Administered 2017-10-22: 2 mg via INTRAVENOUS

## 2017-10-22 MED ORDER — FAMOTIDINE 20 MG PO TABS
ORAL_TABLET | ORAL | Status: AC
  Administered 2017-10-22: 20 mg via ORAL
  Filled 2017-10-22: qty 1

## 2017-10-22 MED ORDER — OXYCODONE-ACETAMINOPHEN 5-325 MG PO TABS: 1.0000 | ORAL_TABLET | ORAL | 0 refills | Status: DC | PRN

## 2017-10-22 MED ORDER — LACTATED RINGERS IV SOLN
INTRAVENOUS | Status: DC | PRN
  Administered 2017-10-22: 09:00:00 via INTRAVENOUS

## 2017-10-22 SURGICAL SUPPLY — 29 items
BLADE SURG 15 STRL LF DISP TIS (BLADE) ×1 IMPLANT
BLADE SURG 15 STRL SS (BLADE) ×1
CHLORAPREP W/TINT 26ML (MISCELLANEOUS) ×2 IMPLANT
DECANTER SPIKE VIAL GLASS SM (MISCELLANEOUS) ×4 IMPLANT
DERMABOND ADVANCED (GAUZE/BANDAGES/DRESSINGS) ×1
DERMABOND ADVANCED .7 DNX12 (GAUZE/BANDAGES/DRESSINGS) ×1 IMPLANT
DRAPE LAPAROTOMY 77X122 PED (DRAPES) ×2 IMPLANT
ELECT REM PT RETURN 9FT ADLT (ELECTROSURGICAL) ×2
ELECTRODE REM PT RTRN 9FT ADLT (ELECTROSURGICAL) ×1 IMPLANT
GLOVE BIO SURGEON STRL SZ7 (GLOVE) ×2 IMPLANT
GLOVE BIOGEL PI IND STRL 7.5 (GLOVE) ×1 IMPLANT
GLOVE BIOGEL PI INDICATOR 7.5 (GLOVE) ×1
KIT TURNOVER KIT A (KITS) ×2 IMPLANT
MESH VENTRALEX ST 2.5 CRC MED (Mesh General) ×2 IMPLANT
NEEDLE HYPO 25X1 1.5 SAFETY (NEEDLE) ×2 IMPLANT
NS IRRIG 1000ML POUR BTL (IV SOLUTION) ×2 IMPLANT
PACK BASIN MINOR ARMC (MISCELLANEOUS) ×2 IMPLANT
SUT ETHIBOND CT1 BRD 2-0 30IN (SUTURE) IMPLANT
SUT ETHIBOND NAB MO 7 #0 18IN (SUTURE) ×2 IMPLANT
SUT MNCRL AB 4-0 PS2 18 (SUTURE) ×2 IMPLANT
SUT VIC AB 2-0 CT2 27 (SUTURE) IMPLANT
SUT VIC AB 2-0 SH 27 (SUTURE) ×1
SUT VIC AB 2-0 SH 27XBRD (SUTURE) ×1 IMPLANT
SUT VIC AB 3-0 54X BRD REEL (SUTURE) IMPLANT
SUT VIC AB 3-0 BRD 54 (SUTURE)
SUT VIC AB 3-0 SH 27 (SUTURE)
SUT VIC AB 3-0 SH 27X BRD (SUTURE) IMPLANT
SYR 10ML LL (SYRINGE) ×2 IMPLANT
SYR BULB IRRIG 60ML STRL (SYRINGE) IMPLANT

## 2017-10-22 NOTE — Interval H&P Note (Signed)
History and Physical Interval Note:  10/22/2017 8:21 AM  Alexander Powers  has presented today for surgery, with the diagnosis of UMBILICAL HERNIA  The various methods of treatment have been discussed with the patient and family. After consideration of risks, benefits and other options for treatment, the patient has consented to  Procedure(s): HERNIA REPAIR UMBILICAL ADULT WITH MESH (N/A) INSERTION OF MESH (N/A) as a surgical intervention .  The patient's history has been reviewed, patient examined, no change in status, stable for surgery.  I have reviewed the patient's chart and labs.  Questions were answered to the patient's satisfaction.     Ancil LinseyJason Evan Ludy Messamore

## 2017-10-22 NOTE — Anesthesia Preprocedure Evaluation (Signed)
Anesthesia Evaluation  Patient identified by MRN, date of birth, ID band Patient awake    Reviewed: Allergy & Precautions, H&P , NPO status , Patient's Chart, lab work & pertinent test results  History of Anesthesia Complications Negative for: history of anesthetic complications  Airway Mallampati: I  TM Distance: >3 FB Neck ROM: full    Dental  (+) Chipped, Poor Dentition   Pulmonary neg shortness of breath, former smoker,           Cardiovascular Exercise Tolerance: Good (-) angina(-) Past MI and (-) DOE negative cardio ROS       Neuro/Psych negative neurological ROS  negative psych ROS   GI/Hepatic Neg liver ROS, GERD  Medicated and Controlled,  Endo/Other  diabetes, Type 2  Renal/GU      Musculoskeletal   Abdominal   Peds  Hematology negative hematology ROS (+)   Anesthesia Other Findings Past Medical History: No date: Diabetes mellitus without complication (HCC) No date: GERD (gastroesophageal reflux disease)     Comment:  OCC- TUMS No date: Hypercholesteremia  Past Surgical History: No date: NO PAST SURGERIES  BMI    Body Mass Index:  31.32 kg/m      Reproductive/Obstetrics negative OB ROS                             Anesthesia Physical Anesthesia Plan  ASA: III  Anesthesia Plan: General ETT   Post-op Pain Management:    Induction: Intravenous  PONV Risk Score and Plan: Ondansetron, Dexamethasone and Midazolam  Airway Management Planned: Oral ETT  Additional Equipment:   Intra-op Plan:   Post-operative Plan: Extubation in OR  Informed Consent: I have reviewed the patients History and Physical, chart, labs and discussed the procedure including the risks, benefits and alternatives for the proposed anesthesia with the patient or authorized representative who has indicated his/her understanding and acceptance.   Dental Advisory Given  Plan Discussed with:  Anesthesiologist, CRNA and Surgeon  Anesthesia Plan Comments: (Patient consented for risks of anesthesia including but not limited to:  - adverse reactions to medications - damage to teeth, lips or other oral mucosa - sore throat or hoarseness - Damage to heart, brain, lungs or loss of life  Patient voiced understanding.)        Anesthesia Quick Evaluation

## 2017-10-22 NOTE — Anesthesia Procedure Notes (Signed)
Performed by: Khalia Gong, CRNA       

## 2017-10-22 NOTE — Anesthesia Procedure Notes (Signed)
Procedure Name: Intubation Date/Time: 10/22/2017 9:15 AM Performed by: Allean Found, CRNA Pre-anesthesia Checklist: Patient identified, Emergency Drugs available, Suction available, Patient being monitored and Timeout performed Patient Re-evaluated:Patient Re-evaluated prior to induction Oxygen Delivery Method: Circle system utilized Preoxygenation: Pre-oxygenation with 100% oxygen Induction Type: IV induction Ventilation: Mask ventilation with difficulty Laryngoscope Size: Mac, McGraph and 4 Grade View: Grade II Tube type: Oral Tube size: 7.5 mm Number of attempts: 2 Airway Equipment and Method: Stylet Placement Confirmation: ETT inserted through vocal cords under direct vision,  positive ETCO2 and breath sounds checked- equal and bilateral Secured at: 24 cm Tube secured with: Tape Dental Injury: Teeth and Oropharynx as per pre-operative assessment  Difficulty Due To: Difficulty was anticipated Comments: Difficult mask due to facial hair.  Caused mask leak, although I could ventilate.  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

## 2017-10-22 NOTE — Transfer of Care (Signed)
Immediate Anesthesia Transfer of Care Note  Patient: Alexander Powers  Procedure(s) Performed: HERNIA REPAIR UMBILICAL ADULT WITH MESH (N/A Abdomen) INSERTION OF MESH (N/A Abdomen)  Patient Location: PACU  Anesthesia Type:General  Level of Consciousness: awake  Airway & Oxygen Therapy: Patient Spontanous Breathing and Patient connected to face mask oxygen  Post-op Assessment: Report given to RN and Post -op Vital signs reviewed and stable  Post vital signs: Reviewed and stable  Last Vitals:  Vitals:   10/22/17 0811 10/22/17 0821  BP: (!) 159/104 (!) 156/50  Pulse: 73 76  Resp: 14   Temp: 36.7 C   SpO2: 100%     Last Pain:  Vitals:   10/22/17 0811  TempSrc: Oral  PainSc: 0-No pain         Complications: No apparent anesthesia complications

## 2017-10-22 NOTE — Op Note (Addendum)
SURGICAL OPERATIVE REPORT  DATE OF PROCEDURE: 10/22/2017  ATTENDING Surgeon(s): Ancil Linseyavis, Diamond Jentz Evan, MD  ASSISTANT: Ross Ludwigestiny Walters, PA-S  ANESTHESIA: GETA  PRE-OPERATIVE DIAGNOSIS: Symptomatic (painful) reducible umbilical hernia (icd-10: K42.9)  POST-OPERATIVE DIAGNOSIS: Symptomatic (painful) reducible umbilical hernia (icd-10: K42.9)  PROCEDURE(S):  1.) Open repair of symptomatic (painful) umbilical hernia with mesh (cpt: [id number])  INTRAOPERATIVE FINDINGS: 2 cm umbilical hernia with adherent omentum and hernia sac  INTRAVENOUS FLUIDS: 850 mL crystalloid   ESTIMATED BLOOD LOSS: Minimal (<20 mL)   URINE OUTPUT: No Foley catheter  SPECIMENS: None  IMPLANTS: 6.4 cm Bard Ventralex ST ventral hernia repair mesh  DRAINS: None  COMPLICATIONS: None apparent  CONDITION AT END OF PROCEDURE: Hemodynamically stable and extubated  DISPOSITION OF PATIENT: PACU  INDICATIONS FOR PROCEDURE:  Patient is a 52 y.o. obese only recently controlled diabetic male who presented upon referral from his PMD for evaluation of patient's umbilical hernia. He denies abdominal distention, change in bowel function, or N/V and likewise deniesconstipation, straining with urination, or coughing and expressed that he'd like to have it repaired. All risks, benefits, and alternatives to above elective procedure were discussed with the patient, all of patient's questions were answered to his expressed satisfaction, and informed consent was obtained and documented accordingly.  DETAILS OF PROCEDURE: Patient was brought to the operating suite and appropriately identified. General anesthesia was administered along with appropriate pre-operative antibiotics, and endotracheal intubation was performed by anesthetist. In supine position, operative site was prepped and draped in the usual sterile fashion, and following a brief time out, local anesthetic was injected inferior to the umbilicus, and a 3 cm transverse  curvilinear incision was made using a #15 blade scalpel and extended deep through subcutaneous tissues using blunt dissection and electrocautery. The hernia sac was then dissected from the undersurface of the umbilical skin and stalk, and the fascial defect was cleared of all omental adhesions. A 6.4 cm Bard Ventralex ST mesh patch was selected, inserted, confirmed to approximate well to fascial edges without intervening viscera or omentum, and patch was secured without tension using Ethibond braided non-absorbable suture. The umbilicus was then invaginated using 2-0 Vicryl suture from the dermis to fascia, and the wound was irrigated using sterile saline. Overlying tissues were re-approximated in layers using buried interrupted 3-0 Vicryl suture for dermis and running subcuticular 4-0 Monocryl suture to re-approximate skin, which was cleaned and dried and sterile Dermabond skin glue was applied and allowed to dry.  Patient was then safely able to be extubated, awakened, and transferred to PACU for post-operative monitoring and care.  I was present for all aspects of the above procedure, and there were no complications apparent.

## 2017-10-22 NOTE — Anesthesia Postprocedure Evaluation (Signed)
Anesthesia Post Note  Patient: Alexander Powers  Procedure(s) Performed: HERNIA REPAIR UMBILICAL ADULT WITH MESH (N/A Abdomen) INSERTION OF MESH (N/A Abdomen)  Patient location during evaluation: PACU Anesthesia Type: General Level of consciousness: awake and alert Pain management: pain level controlled Vital Signs Assessment: post-procedure vital signs reviewed and stable Respiratory status: spontaneous breathing, nonlabored ventilation, respiratory function stable and patient connected to nasal cannula oxygen Cardiovascular status: blood pressure returned to baseline and stable Postop Assessment: no apparent nausea or vomiting Anesthetic complications: no     Last Vitals:  Vitals:   10/22/17 1215 10/22/17 1230  BP: 134/78   Pulse: 74 (P) 73  Resp: 12 (P) 16  Temp:  (P) 36.6 C  SpO2: 92% (P) 98%    Last Pain:  Vitals:   10/22/17 1230  TempSrc: (P) Oral  PainSc:                  Cleda MccreedyJoseph K Bernita Beckstrom

## 2017-10-22 NOTE — Discharge Instructions (Addendum)
In addition to included general post-operative instructions for Open Repair of Umbilical Hernia Repair with Mesh,  Diet: Resume home heart healthy diet.   Activity: No heavy lifting >15 pounds (children, pets, laundry, garbage) or strenuous activity until follow-up (no heavy lifting >40 lbs for total of 4 - 6 weeks), but light activity and walking are encouraged. Do not drive or drink alcohol if taking narcotic pain medications.  Wound care: 2 days after surgery (Sunday, 3/17), may shower/get incision wet with soapy water and pat dry (do not rub incisions), but no baths or submerging incision underwater until follow-up.   Medications: Resume all home medications. For mild to moderate pain: acetaminophen (Tylenol) or ibuprofen/naproxen (if no kidney disease). Combining Tylenol with alcohol can substantially increase your risk of causing liver disease. Narcotic pain medications, if prescribed, can be used for severe pain, though may cause nausea, constipation, and drowsiness. Do not combine Tylenol and Percocet (or similar) within a 6 hour period as Percocet (and similar) contain(s) Tylenol. If you do not need the narcotic pain medication, you do not need to fill the prescription.  AMBULATORY SURGERY  DISCHARGE INSTRUCTIONS   1) The drugs that you were given will stay in your system until tomorrow so for the next 24 hours you should not:  A) Drive an automobile B) Make any legal decisions C) Drink any alcoholic beverage   2) You may resume regular meals tomorrow.  Today it is better to start with liquids and gradually work up to solid foods.  You may eat anything you prefer, but it is better to start with liquids, then soup and crackers, and gradually work up to solid foods.   3) Please notify your doctor immediately if you have any unusual bleeding, trouble breathing, redness and pain at the surgery site, drainage, fever, or pain not relieved by medication.    4) Additional  Instructions:  Please contact your physician with any problems or Same Day Surgery at [phone], Monday through Friday 6 am to 4 pm, or [place] at [facility] Main number at [phone].  Call office [phone]) at any time if any questions, worsening pain, fevers/chills, bleeding, drainage from incision site, or other concerns.

## 2017-10-22 NOTE — Progress Notes (Signed)
Dr. Randa NgoPiscitello notified of pt CBG: 205. Dr. Randa NgoPiscitello stated not to give insulin as pt is not insulin dependent at home and that pt stated will be following up with endocrinology. Riaz Onorato E 11:39 AM 10/22/2017

## 2017-10-22 NOTE — Anesthesia Post-op Follow-up Note (Signed)
Anesthesia QCDR form completed.        

## 2017-10-31 DIAGNOSIS — K429 Umbilical hernia without obstruction or gangrene: Secondary | ICD-10-CM

## 2017-11-02 DIAGNOSIS — E1165 Type 2 diabetes mellitus with hyperglycemia: Secondary | ICD-10-CM | POA: Diagnosis not present

## 2017-11-04 ENCOUNTER — Encounter: Payer: Self-pay | Admitting: Surgery

## 2017-11-04 ENCOUNTER — Ambulatory Visit (INDEPENDENT_AMBULATORY_CARE_PROVIDER_SITE_OTHER): Payer: Commercial Managed Care - PPO | Admitting: Surgery

## 2017-11-04 VITALS — BP 142/83 | HR 92 | Temp 98.3°F

## 2017-11-04 DIAGNOSIS — K4090 Unilateral inguinal hernia, without obstruction or gangrene, not specified as recurrent: Secondary | ICD-10-CM | POA: Diagnosis not present

## 2017-11-04 NOTE — Patient Instructions (Signed)

## 2017-11-04 NOTE — Progress Notes (Signed)
Outpatient Surgical Follow Up  11/04/2017  Alexander Powers is an 52 y.o. male.   Chief Complaint  Patient presents with  . Routine Post Op    HERNIA REPAIR UMBILICAL ADULT WITH MESH N/A General    HPI: s/p uh repair by Dr. Earlene Plateravis 3/15 . Doing well from that perspective, taking po. Comes for a new right inguinal pain and a right bulge.  Pain is worsening with Valsalva and its mild to moderate in intensity and intermittent in nature.  No evidence of obstruction.  Past Medical History:  Diagnosis Date  . Diabetes mellitus without complication (HCC)   . Difficult intubation   . GERD (gastroesophageal reflux disease)    OCC- TUMS  . Hypercholesteremia     Past Surgical History:  Procedure Laterality Date  . INSERTION OF MESH N/A 10/22/2017   Procedure: INSERTION OF MESH;  Surgeon: Ancil Linseyavis, Jason Evan, MD;  Location: [facility] ORS;  Service: General;  Laterality: N/A;  . NO PAST SURGERIES    . UMBILICAL HERNIA REPAIR N/A 10/22/2017   Procedure: HERNIA REPAIR UMBILICAL ADULT WITH MESH;  Surgeon: Ancil Linseyavis, Jason Evan, MD;  Location: [facility] ORS;  Service: General;  Laterality: N/A;    Family History  Problem Relation Age of Onset  . Diabetes Father     Social History:  reports that he quit smoking about 15 years ago. His smoking use included cigarettes. He has a 13.50 pack-year smoking history. He has never used smokeless tobacco. He reports that he drinks alcohol. He reports that he does not use drugs.  Allergies: No Known Allergies  Medications reviewed.    ROS Full ROS performed and is otherwise negative other than what is stated in HPI   BP (!) 142/83   Pulse 92   Temp 98.3 F (36.8 C) (Oral)   Physical Exam  Constitutional: He is oriented to person, place, and time and well-developed, well-nourished, and in no distress. No distress.  Neck: Normal range of motion. No JVD present. No tracheal deviation present. No thyromegaly present.  Pulmonary/Chest: Effort normal. No  respiratory distress.  Abdominal: Soft. He exhibits no distension. There is no tenderness. There is no rebound and no guarding.  Umbilical incision healing well, no infection. He does have a Right IH, no incarceration, tender to palpation reducible  Musculoskeletal: Normal range of motion.  Neurological: He is alert and oriented to person, place, and time. Gait normal. GCS score is 15.  Skin: Skin is warm and dry. He is not diaphoretic.  Psychiatric: Memory, affect and judgment normal.  Nursing note and vitals reviewed.   Assessment/Plan: Umbilical hernia status post repair doing very well now he does have a new diagnosis of a right inguinal hernia that is symptomatic.  Discussed with the patient in detail and I do recommend repair at some point in time.  Patient wishes to wait at least a couple months before embarking into another surgery.  Discussed with the patient in detail about his new diagnosis and the possible risk of incarceration or strangulation.  He understands.  [name], MD [facility] General Surgeon

## 2017-11-05 ENCOUNTER — Encounter: Payer: Self-pay | Admitting: Surgery

## 2017-12-07 DIAGNOSIS — E119 Type 2 diabetes mellitus without complications: Secondary | ICD-10-CM | POA: Diagnosis not present

## 2018-01-10 ENCOUNTER — Ambulatory Visit (INDEPENDENT_AMBULATORY_CARE_PROVIDER_SITE_OTHER): Payer: Commercial Managed Care - PPO | Admitting: Surgery

## 2018-01-10 ENCOUNTER — Encounter: Payer: Self-pay | Admitting: Surgery

## 2018-01-10 VITALS — BP 144/90 | HR 80 | Temp 80.0°F | Wt 204.0 lb

## 2018-01-10 DIAGNOSIS — Z09 Encounter for follow-up examination after completed treatment for conditions other than malignant neoplasm: Secondary | ICD-10-CM

## 2018-01-10 NOTE — Patient Instructions (Signed)
Please give us a call in case you have any questions or concerns.  

## 2018-01-10 NOTE — Progress Notes (Signed)
S/p UH repair, newly dx RIH He is doing well No complaints at this time  PE NAD Abd: soft, umbilical scar, no evidence of recurrence. Small reducible RIH  A/P D/W the pt about newly dx RIH, he does not wish any surgical intervention at this time HE knows about the potential complications of incarceration  HE will call us back if this becomes symptomatic RTC prn

## 2018-01-21 ENCOUNTER — Ambulatory Visit
Admission: EM | Admit: 2018-01-21 | Discharge: 2018-01-21 | Disposition: A | Discharge status: Home / Self Care | Payer: Commercial Managed Care - PPO | Attending: Registered Nurse | Admitting: Registered Nurse

## 2018-01-21 ENCOUNTER — Other Ambulatory Visit: Payer: Self-pay

## 2018-01-21 ENCOUNTER — Encounter: Payer: Self-pay | Admitting: Emergency Medicine

## 2018-01-21 DIAGNOSIS — N5089 Other specified disorders of the male genital organs: Secondary | ICD-10-CM | POA: Diagnosis not present

## 2018-01-21 NOTE — ED Provider Notes (Signed)
[facility]    CSN: [id number] Arrival date & time: 01/21/18  0808     History   Chief Complaint Chief Complaint  Patient presents with  . Testicle Pain    left    HPI Alexander Powers is a 52 y.o. male.   52y/o african [place] male with heavy lifting at job accompanied by significant other male in exam room.  Here for evaluation of intermittent swelling of left testicle.  Has applied ice prn noted swelling prior to ice and it resolves afterwards.  Denied pain/rash/dysuria/penile discharge/history of trauma or previous testicular infections/trauma.  Last blood sugar check one week ago 120s.  Mar 2019 had umbilical hernia repair noted RIH.  Patient denied bulge any longer stated it went away after icing but wanted to have it checked out.  Wears compression thigh length briefs for underwear.       Past Medical History:  Diagnosis Date  . Diabetes mellitus without complication (HCC)   . Difficult intubation   . GERD (gastroesophageal reflux disease)    OCC- TUMS  . Hypercholesteremia     Patient Active Problem List   Diagnosis Date Noted  . Umbilical hernia without obstruction or gangrene     Past Surgical History:  Procedure Laterality Date  . HERNIA REPAIR    . INSERTION OF MESH N/A 10/22/2017   Procedure: INSERTION OF MESH;  Surgeon: Ancil Linsey, MD;  Location: [facility] OR[facility];  Service: General;  Laterality: N/A;  . NO PAST SURGERIES    . UMBILICAL HERNIA REPAIR N/A 10/22/2017   Procedure: HERNIA REPAIR UMBILICAL ADULT WITH MESH;  Surgeon: Ancil Linsey, MD;  Location: [facility] OR[facility];  Service: General;  Laterality: N/A;       Home Medications    Prior to Admission medications   Medication Sig Start Date End Date Taking? Authorizing Provider  atorvastatin (LIPITOR) 20 MG tablet Take 20 mg by mouth 1 day or 1 dose.   Yes [provider]  glipiZIDE (GLUCOTROL XL) 10 MG 24 hr tablet Take 10 mg by mouth daily with breakfast.   Yes [provider]  lovastatin (MEVACOR) 20 MG tablet Take 20 mg by mouth at bedtime.   Yes [provider]  metFORMIN (GLUCOPHAGE) 1000 MG tablet Take 1,000 mg by mouth 3 (three) times daily with meals.   Yes [provider]  Semaglutide 0.25 or 0.5 MG/DOSE SOPN Inject 0.25 mg into the skin every 7 (seven) days. 11/03/17  Yes [provider]    Family History Family History  Problem Relation Age of Onset  . Diabetes Father     Social History Social History   Tobacco Use  . Smoking status: Former Smoker    Packs/day: 0.50    Years: 27.00    Pack years: 13.50    Types: Cigarettes    Last attempt to quit: 10/16/2002    Years since quitting: 15.2  . Smokeless tobacco: Never Used  Substance Use Topics  . Alcohol use: Yes    Comment: occasionally  . Drug use: No     Allergies   Patient has no known allergies.   Review of Systems Review of Systems  Constitutional: Negative for chills, diaphoresis, fatigue and fever.  HENT: Negative for trouble swallowing and voice change.   Eyes: Negative for photophobia and visual disturbance.  Respiratory: Negative for cough, shortness of breath, wheezing and stridor.   Cardiovascular: Negative for chest pain and leg swelling.  Gastrointestinal: Negative for abdominal pain, blood  in stool, constipation, diarrhea, nausea and vomiting.  Endocrine: Negative for cold intolerance and heat intolerance.  Genitourinary: Positive for scrotal swelling. Negative for decreased urine volume, difficulty urinating, discharge, dysuria, enuresis, flank pain, frequency, genital sores, hematuria, penile pain, penile swelling, testicular pain and urgency.  Musculoskeletal: Negative for arthralgias, back pain, gait problem, myalgias, neck pain and neck stiffness.  Skin: Negative for color change, rash and wound.  Allergic/Immunologic: Positive for immunocompromised state. Negative for food allergies.  Neurological: Negative for dizziness,  tremors, syncope, weakness, light-headedness, numbness and headaches.  Hematological: Negative for adenopathy. Does not bruise/bleed easily.  Psychiatric/Behavioral: Negative for agitation, confusion and sleep disturbance. The patient is not nervous/anxious.      Physical Exam Triage Vital Signs ED Triage Vitals  Enc Vitals Group     BP 01/21/18 0819 138/72     Pulse Rate 01/21/18 0819 74     Resp 01/21/18 0819 16     Temp 01/21/18 0819 98.1 F (36.7 C)     Temp Source 01/21/18 0819 Oral     SpO2 01/21/18 0819 98 %     Weight 01/21/18 0816 204 lb (92.5 kg)     Height 01/21/18 0816 5' 7.5" (1.715 m)     Head Circumference --      Peak Flow --      Pain Score 01/21/18 0816 0     Pain Loc --      Pain Edu? --      Excl. in GC? --    No data found.  Updated Vital Signs BP 138/72 (BP Location: Left Arm)   Pulse 74   Temp 98.1 F (36.7 C) (Oral)   Resp 16   Ht 5' 7.5" (1.715 m)   Wt 204 lb (92.5 kg)   SpO2 98%   BMI 31.48 kg/m   Visual Acuity Right Eye Distance:   Left Eye Distance:   Bilateral Distance:    Right Eye Near:   Left Eye Near:    Bilateral Near:     Physical Exam  Constitutional: He is oriented to person, place, and time. Vital signs are normal. He appears well-developed and well-nourished. He is active and cooperative.  Non-toxic appearance. He does not have a sickly appearance. He does not appear ill. No distress.  HENT:  Head: Normocephalic and atraumatic.  Right Ear: Hearing and external ear normal.  Left Ear: Hearing and external ear normal.  Nose: Nose normal.  Mouth/Throat: Uvula is midline, oropharynx is clear and moist and mucous membranes are normal. No oropharyngeal exudate.  Eyes: Pupils are equal, round, and reactive to light. Conjunctivae, EOM and lids are normal. Right eye exhibits no discharge. Left eye exhibits no discharge. No scleral icterus.  Neck: Trachea normal, normal range of motion and phonation normal. Neck supple. No tracheal  deviation present.  Cardiovascular: Normal rate, regular rhythm, normal heart sounds and intact distal pulses.  Pulmonary/Chest: Effort normal and breath sounds normal. No stridor. No respiratory distress. He has no decreased breath sounds. He has no wheezes. He has no rhonchi. He has no rales.  No cough observed in exam room; spoke full sentences without difficulty  Abdominal: Soft. Normal appearance and bowel sounds are normal. He exhibits no distension, no fluid wave, no ascites and no mass. There is no tenderness. There is no rigidity and no guarding. No hernia. Hernia confirmed negative in the right inguinal area and confirmed negative in the left inguinal area.  Genitourinary: Testes normal and penis normal. Cremasteric reflex  is present. Right testis shows no mass, no swelling and no tenderness. Right testis is descended. Cremasteric reflex is not absent on the right side. Left testis shows no mass, no swelling and no tenderness. Left testis is descended. Cremasteric reflex is not absent on the left side. Circumcised. No phimosis, paraphimosis, hypospadias, penile erythema or penile tenderness. No discharge found.     Genitourinary Comments: Patient could not feel swelling sitting when standing he points he can feel fluid lateral left scrotum; bilateral scrotum equal size/shape on exam chaperoned by  RN Creig Hines  Musculoskeletal: Normal range of motion. He exhibits no edema, tenderness or deformity.       Right shoulder: Normal.       Left shoulder: Normal.       Right elbow: Normal.      Left elbow: Normal.       Right hip: Normal.       Left hip: Normal.       Right knee: Normal.       Left knee: Normal.       Right ankle: Normal.       Left ankle: Normal.       Cervical back: Normal.       Thoracic back: Normal.       Lumbar back: Normal.       Right hand: Normal.       Left hand: Normal.  Lymphadenopathy:    He has no cervical adenopathy.       Right cervical: No superficial  cervical adenopathy present.      Left cervical: No superficial cervical adenopathy present. No inguinal adenopathy noted on the right or left side.       Right: No inguinal adenopathy present.       Left: No inguinal adenopathy present.  Neurological: He is alert and oriented to person, place, and time. He has normal strength. He is not disoriented. He displays no atrophy and no tremor. No cranial nerve deficit or sensory deficit. He exhibits normal muscle tone. He displays no seizure activity. Coordination and gait normal. GCS eye subscore is 4. GCS verbal subscore is 5. GCS motor subscore is 6.  In/out of chair; on/off exam table without difficulty; gait sure and steady in hallway  Skin: Skin is warm, dry and intact. Capillary refill takes less than 2 seconds. No rash noted. He is not diaphoretic. No erythema. No pallor.  Psychiatric: He has a normal mood and affect. His speech is normal and behavior is normal. Judgment and thought content normal. Cognition and memory are normal.  Nursing note and vitals reviewed.    UC Treatments / Results  Labs (all labs ordered are listed, but only abnormal results are displayed) Labs Reviewed - No data to display  EKG None  Radiology No results found.  Procedures Procedures (including critical care time)  Medications Ordered in UC Medications - No data to display  Initial Impression / Assessment and Plan / UC Course  I have reviewed the triage vital signs and the nursing notes.  Pertinent labs & imaging results that were available during my care of the patient were reviewed by me and considered in my medical decision making (see chart for details).   Case discussed with Dr Domenick Gong no acute pain or appreciable swelling testicles bilaterally symmetrical.  [place] not available at [facility] today.  Daily Compression underwear/jock strap, ice 15 minutes QID prn swelling; keep watch for signs of infection e.g. Rash/hot to touch/redness; foul odor  to urine or penile discharge, worsening swelling or pian.  Spouse with patient in room.  RIH resolved spontaneously per patient when he went for check up with surgeon.  Needs work note for today given 24 hour excuse.  Discussed signs/symptoms hydrocele/varicocele/inguinal hernia/epididymitis/testicular torsion/infection with patient. Exitcare handout on inguinal hernia/hydrocele and varicocele printed and given to patient. Follow up if any of these symptoms occur/worsen for re-evaluation consider scrotal US.  Patient and spouse verbalized understanding information/instructions, agreed with plan of care and had no further questions at this time.  Final Clinical Impressions(s) / UC Diagnoses   Final diagnoses:  Scrotal swelling   Discharge Instructions   None    ED Prescriptions    None     Controlled Substance Prescriptions [place] Controlled Substance Registry consulted? Not Applicable   Barbaraann Barthel, NP 01/21/18 [phone]

## 2018-01-21 NOTE — ED Triage Notes (Signed)
Patient c/o swelling pain in his left testicle that started on Monday.

## 2018-01-24 ENCOUNTER — Ambulatory Visit
Admission: EM | Admit: 2018-01-24 | Discharge: 2018-01-24 | Disposition: A | Discharge status: Home / Self Care | Payer: Commercial Managed Care - PPO | Attending: Family Medicine | Admitting: Family Medicine

## 2018-01-24 ENCOUNTER — Other Ambulatory Visit: Payer: Self-pay

## 2018-01-24 DIAGNOSIS — J020 Streptococcal pharyngitis: Secondary | ICD-10-CM

## 2018-01-24 DIAGNOSIS — R509 Fever, unspecified: Secondary | ICD-10-CM | POA: Diagnosis not present

## 2018-01-24 DIAGNOSIS — J029 Acute pharyngitis, unspecified: Secondary | ICD-10-CM

## 2018-01-24 LAB — RAPID STREP SCREEN (MED CTR MEBANE ONLY): STREPTOCOCCUS, GROUP A SCREEN (DIRECT): POSITIVE — AB

## 2018-01-24 MED ORDER — PENICILLIN G BENZATHINE 1200000 UNIT/2ML IM SUSP
1.2000 10*6.[IU] | Freq: Once | INTRAMUSCULAR | Status: AC
  Administered 2018-01-24: 1.2 10*6.[IU] via INTRAMUSCULAR

## 2018-01-24 MED ORDER — PREDNISONE 50 MG PO TABS: ORAL_TABLET | ORAL | 0 refills | Status: DC

## 2018-01-24 NOTE — ED Triage Notes (Signed)
Patient complaining of severe sore throat that started 2 days. Hurts to swallow and talk.

## 2018-01-24 NOTE — ED Provider Notes (Signed)
[facility]  CSN: [id number] Arrival date & time: 01/24/18  1619  History   Chief Complaint Chief Complaint  Patient presents with  . Sore Throat   HPI  52 year old male presents with sore throat.  Patient reports a 2-day history of severe sore throat.  Worsening.  Difficulty swallowing.  Associated ear pain.  He has been experiencing fever as well.  Exacerbated by swallowing.  No relieving factors.  Patient reports that he has been spitting up secretions as well.  No other reported symptoms.  No other complaints.  Past Medical History:  Diagnosis Date  . Diabetes mellitus without complication (HCC)   . Difficult intubation   . GERD (gastroesophageal reflux disease)    OCC- TUMS  . Hypercholesteremia     Patient Active Problem List   Diagnosis Date Noted  . Umbilical hernia without obstruction or gangrene     Past Surgical History:  Procedure Laterality Date  . HERNIA REPAIR    . INSERTION OF MESH N/A 10/22/2017   Procedure: INSERTION OF MESH;  Surgeon: Ancil Linseyavis, Jason Evan, MD;  Location: [facility];  Service: General;  Laterality: N/A;  . NO PAST SURGERIES    . UMBILICAL HERNIA REPAIR N/A 10/22/2017   Procedure: HERNIA REPAIR UMBILICAL ADULT WITH MESH;  Surgeon: Ancil Linseyavis, Jason Evan, MD;  Location: [facility] OR[facility];  Service: General;  Laterality: N/A;       Home Medications    Prior to Admission medications   Medication Sig Start Date End Date Taking? Authorizing Provider  atorvastatin (LIPITOR) 20 MG tablet Take 20 mg by mouth 1 day or 1 dose.   Yes [provider]  glipiZIDE (GLUCOTROL XL) 10 MG 24 hr tablet Take 10 mg by mouth daily with breakfast.   Yes [provider]  lovastatin (MEVACOR) 20 MG tablet Take 20 mg by mouth at bedtime.   Yes [provider]  metFORMIN (GLUCOPHAGE) 1000 MG tablet Take 1,000 mg by mouth 3 (three) times daily with meals.   Yes [provider]  Semaglutide 0.25 or 0.5 MG/DOSE SOPN Inject 0.25 mg into  the skin every 7 (seven) days. 11/03/17  Yes [provider]  predniSONE (DELTASONE) 50 MG tablet 1 tablet daily x 5 days. 01/24/18   Tommie Samsook, Mahamadou Weltz G, DO    Family History Family History  Problem Relation Age of Onset  . Diabetes Father     Social History Social History   Tobacco Use  . Smoking status: Former Smoker    Packs/day: 0.50    Years: 27.00    Pack years: 13.50    Types: Cigarettes    Last attempt to quit: 10/16/2002    Years since quitting: 15.2  . Smokeless tobacco: Never Used  Substance Use Topics  . Alcohol use: Yes    Comment: occasionally  . Drug use: No     Allergies   Patient has no known allergies.   Review of Systems Review of Systems  Constitutional: Positive for fever.  HENT: Positive for sore throat.    Physical Exam Triage Vital Signs ED Triage Vitals  Enc Vitals Group     BP 01/24/18 1658 (!) 146/88     Pulse Rate 01/24/18 1658 (!) 116     Resp 01/24/18 1658 18     Temp 01/24/18 1658 (!) 100.4 F (38 C)     Temp Source 01/24/18 1658 Oral     SpO2 01/24/18 1658 98 %     Weight 01/24/18 1657 204 lb (92.5  kg)     Height 01/24/18 1657 5' 7.5" (1.715 m)     Head Circumference --      Peak Flow --      Pain Score 01/24/18 1657 10     Pain Loc --      Pain Edu? --      Excl. in GC? --    Updated Vital Signs BP (!) 146/88 (BP Location: Left Arm)   Pulse (!) 116   Temp (!) 100.4 F (38 C) (Oral)   Resp 18   Ht 5' 7.5" (1.715 m)   Wt 204 lb (92.5 kg)   SpO2 98%   BMI 31.48 kg/m     Physical Exam  Constitutional: He is oriented to person, place, and time. He appears well-developed. No distress.  HENT:  Head: Normocephalic and atraumatic.  Mouth/Throat: Uvula swelling present. Posterior oropharyngeal edema and posterior oropharyngeal erythema present. Tonsils are 2+ on the right. Tonsils are 2+ on the left. Tonsillar exudate.  No apparent peritonsillar abscess.  Patient does have a slight hot potato voice.  Cardiovascular:  Normal rate and regular rhythm.  Pulmonary/Chest: Effort normal and breath sounds normal. He has no wheezes. He has no rales.  Neurological: He is alert and oriented to person, place, and time.  Psychiatric: He has a normal mood and affect. His behavior is normal.  Nursing note and vitals reviewed.  UC Treatments / Results  Labs (all labs ordered are listed, but only abnormal results are displayed) Labs Reviewed  RAPID STREP SCREEN ([facility] ONLY) - Abnormal; Notable for the following components:      Result Value   Streptococcus, Group A Screen (Direct) POSITIVE (*)    All other components within normal limits    EKG None  Radiology No results found.  Procedures Procedures (including critical care time)  Medications Ordered in UC Medications  penicillin g benzathine (BICILLIN LA) 1200000 UNIT/2ML injection 1.2 Million Units (1.2 Million Units Intramuscular Given 01/24/18 1733)    Initial Impression / Assessment and Plan / UC Course  I have reviewed the triage vital signs and the nursing notes.  Pertinent labs & imaging results that were available during my care of the patient were reviewed by me and considered in my medical decision making (see chart for details).    52 year old male presents with severe sore throat.  Strep was positive.  Penicillin given.  Placing on a brief burst of steroids given severity of his pharyngitis.  Final Clinical Impressions(s) / UC Diagnoses   Final diagnoses:  Strep pharyngitis     Discharge Instructions     Medication as prescribed.  If you fail to improve or worsen, go to the hospital.  Take care  Dr. Adriana Simas    ED Prescriptions    Medication Sig Dispense Auth. Provider   predniSONE (DELTASONE) 50 MG tablet 1 tablet daily x 5 days. 5 tablet Tommie Sams, DO     Controlled Substance Prescriptions [place] Controlled Substance Registry consulted? Not Applicable   Tommie Sams, DO 01/24/18 2100

## 2018-01-24 NOTE — Discharge Instructions (Addendum)
Medication as prescribed.  If you fail to improve or worsen, go to the hospital.  Take care  Dr. Kiyla Ringler  

## 2018-03-02 DIAGNOSIS — H401131 Primary open-angle glaucoma, bilateral, mild stage: Secondary | ICD-10-CM | POA: Diagnosis not present

## 2018-04-13 DIAGNOSIS — H401131 Primary open-angle glaucoma, bilateral, mild stage: Secondary | ICD-10-CM | POA: Diagnosis not present

## 2018-04-21 ENCOUNTER — Encounter: Payer: Self-pay | Admitting: Internal Medicine

## 2018-04-21 ENCOUNTER — Ambulatory Visit (INDEPENDENT_AMBULATORY_CARE_PROVIDER_SITE_OTHER): Payer: Commercial Managed Care - PPO | Admitting: Internal Medicine

## 2018-04-21 VITALS — BP 166/100 | HR 70 | Temp 98.6°F | Ht 67.5 in | Wt 207.6 lb

## 2018-04-21 DIAGNOSIS — I10 Essential (primary) hypertension: Secondary | ICD-10-CM

## 2018-04-21 DIAGNOSIS — E785 Hyperlipidemia, unspecified: Secondary | ICD-10-CM

## 2018-04-21 DIAGNOSIS — E119 Type 2 diabetes mellitus without complications: Secondary | ICD-10-CM | POA: Insufficient documentation

## 2018-04-21 DIAGNOSIS — E1165 Type 2 diabetes mellitus with hyperglycemia: Secondary | ICD-10-CM | POA: Diagnosis not present

## 2018-04-21 DIAGNOSIS — H409 Unspecified glaucoma: Secondary | ICD-10-CM | POA: Diagnosis not present

## 2018-04-21 MED ORDER — LOSARTAN POTASSIUM-HCTZ 50-12.5 MG PO TABS: 1.0000 | ORAL_TABLET | Freq: Every day | ORAL | 0 refills | Status: DC

## 2018-04-21 NOTE — Progress Notes (Signed)
Pre visit review using our clinic review tool, if applicable. No additional management support is needed unless otherwise documented below in the visit note. 

## 2018-04-21 NOTE — Patient Instructions (Addendum)
Take Losartan-HCTZ 1/2 pill x 3 days then 1 pill in the am always  F/u in 2 weeks    Diabetes Mellitus and Nutrition When you have diabetes (diabetes mellitus), it is very important to have healthy eating habits because your blood sugar (glucose) levels are greatly affected by what you eat and drink. Eating healthy foods in the appropriate amounts, at about the same times every day, can help you:  Control your blood glucose.  Lower your risk of heart disease.  Improve your blood pressure.  Reach or maintain a healthy weight.  Every person with diabetes is different, and each person has different needs for a meal plan. Your health care provider may recommend that you work with a diet and nutrition specialist (dietitian) to make a meal plan that is best for you. Your meal plan may vary depending on factors such as:  The calories you need.  The medicines you take.  Your weight.  Your blood glucose, blood pressure, and cholesterol levels.  Your activity level.  Other health conditions you have, such as heart or kidney disease.  How do carbohydrates affect me? Carbohydrates affect your blood glucose level more than any other type of food. Eating carbohydrates naturally increases the amount of glucose in your blood. Carbohydrate counting is a method for keeping track of how many carbohydrates you eat. Counting carbohydrates is important to keep your blood glucose at a healthy level, especially if you use insulin or take certain oral diabetes medicines. It is important to know how many carbohydrates you can safely have in each meal. This is different for every person. Your dietitian can help you calculate how many carbohydrates you should have at each meal and for snack. Foods that contain carbohydrates include:  Bread, cereal, rice, pasta, and crackers.  Potatoes and corn.  Peas, beans, and lentils.  Milk and yogurt.  Fruit and juice.  Desserts, such as cakes, cookies, ice  cream, and candy.  How does alcohol affect me? Alcohol can cause a sudden decrease in blood glucose (hypoglycemia), especially if you use insulin or take certain oral diabetes medicines. Hypoglycemia can be a life-threatening condition. Symptoms of hypoglycemia (sleepiness, dizziness, and confusion) are similar to symptoms of having too much alcohol. If your health care provider says that alcohol is safe for you, follow these guidelines:  Limit alcohol intake to no more than 1 drink per day for nonpregnant women and 2 drinks per day for men. One drink equals 12 oz of beer, 5 oz of wine, or 1 oz of hard liquor.  Do not drink on an empty stomach.  Keep yourself hydrated with water, diet soda, or unsweetened iced tea.  Keep in mind that regular soda, juice, and other mixers may contain a lot of sugar and must be counted as carbohydrates.  What are tips for following this plan? Reading food labels  Start by checking the serving size on the label. The amount of calories, carbohydrates, fats, and other nutrients listed on the label are based on one serving of the food. Many foods contain more than one serving per package.  Check the total grams (g) of carbohydrates in one serving. You can calculate the number of servings of carbohydrates in one serving by dividing the total carbohydrates by 15. For example, if a food has 30 g of total carbohydrates, it would be equal to 2 servings of carbohydrates.  Check the number of grams (g) of saturated and trans fats in one serving. Choose foods that  have low or no amount of these fats.  Check the number of milligrams (mg) of sodium in one serving. Most people should limit total sodium intake to less than 2,300 mg per day.  Always check the nutrition information of foods labeled as "low-fat" or "nonfat". These foods may be higher in added sugar or refined carbohydrates and should be avoided.  Talk to your dietitian to identify your daily goals for  nutrients listed on the label. Shopping  Avoid buying canned, premade, or processed foods. These foods tend to be high in fat, sodium, and added sugar.  Shop around the outside edge of the grocery store. This includes fresh fruits and vegetables, bulk grains, fresh meats, and fresh dairy. Cooking  Use low-heat cooking methods, such as baking, instead of high-heat cooking methods like deep frying.  Cook using healthy oils, such as olive, canola, or sunflower oil.  Avoid cooking with butter, cream, or high-fat meats. Meal planning  Eat meals and snacks regularly, preferably at the same times every day. Avoid going long periods of time without eating.  Eat foods high in fiber, such as fresh fruits, vegetables, beans, and whole grains. Talk to your dietitian about how many servings of carbohydrates you can eat at each meal.  Eat 4-6 ounces of lean protein each day, such as lean meat, chicken, fish, eggs, or tofu. 1 ounce is equal to 1 ounce of meat, chicken, or fish, 1 egg, or 1/4 cup of tofu.  Eat some foods each day that contain healthy fats, such as avocado, nuts, seeds, and fish. Lifestyle   Check your blood glucose regularly.  Exercise at least 30 minutes 5 or more days each week, or as told by your health care provider.  Take medicines as told by your health care provider.  Do not use any products that contain nicotine or tobacco, such as cigarettes and e-cigarettes. If you need help quitting, ask your health care provider.  Work with a Veterinary surgeon or diabetes educator to identify strategies to manage stress and any emotional and social challenges. What are some questions to ask my health care provider?  Do I need to meet with a diabetes educator?  Do I need to meet with a dietitian?  What number can I call if I have questions?  When are the best times to check my blood glucose? Where to find more information:  American Diabetes Association:  diabetes.org/food-and-fitness/food  Academy of Nutrition and Dietetics: https://www.vargas.com/  [employer] of Diabetes and Digestive and Kidney Diseases (NIH): FindJewelers.cz Summary  A healthy meal plan will help you control your blood glucose and maintain a healthy lifestyle.  Working with a diet and nutrition specialist (dietitian) can help you make a meal plan that is best for you.  Keep in mind that carbohydrates and alcohol have immediate effects on your blood glucose levels. It is important to count carbohydrates and to use alcohol carefully. This information is not intended to replace advice given to you by your health care provider. Make sure you discuss any questions you have with your health care provider. Document Released: 04/23/2005 Document Revised: 08/31/2016 Document Reviewed: 08/31/2016 Elsevier Interactive Patient Education  2018 [employer].  DASH Eating Plan DASH stands for "Dietary Approaches to Stop Hypertension." The DASH eating plan is a healthy eating plan that has been shown to reduce high blood pressure (hypertension). It may also reduce your risk for type 2 diabetes, heart disease, and stroke. The DASH eating plan may also help with weight loss.  What are tips for following this plan? General guidelines  Avoid eating more than 2,300 mg (milligrams) of salt (sodium) a day. If you have hypertension, you may need to reduce your sodium intake to 1,500 mg a day.  Limit alcohol intake to no more than 1 drink a day for nonpregnant women and 2 drinks a day for men. One drink equals 12 oz of beer, 5 oz of wine, or 1 oz of hard liquor.  Work with your health care provider to maintain a healthy body weight or to lose weight. Ask what an ideal weight is for you.  Get at least 30 minutes of exercise that causes your heart to beat faster (aerobic  exercise) most days of the week. Activities may include walking, swimming, or biking.  Work with your health care provider or diet and nutrition specialist (dietitian) to adjust your eating plan to your individual calorie needs. Reading food labels  Check food labels for the amount of sodium per serving. Choose foods with less than 5 percent of the Daily Value of sodium. Generally, foods with less than 300 mg of sodium per serving fit into this eating plan.  To find whole grains, look for the word "whole" as the first word in the ingredient list. Shopping  Buy products labeled as "low-sodium" or "no salt added."  Buy fresh foods. Avoid canned foods and premade or frozen meals. Cooking  Avoid adding salt when cooking. Use salt-free seasonings or herbs instead of table salt or sea salt. Check with your health care provider or pharmacist before using salt substitutes.  Do not fry foods. Cook foods using healthy methods such as baking, boiling, grilling, and broiling instead.  Cook with heart-healthy oils, such as olive, canola, soybean, or sunflower oil. Meal planning   Eat a balanced diet that includes: ? 5 or more servings of fruits and vegetables each day. At each meal, try to fill half of your plate with fruits and vegetables. ? Up to 6-8 servings of whole grains each day. ? Less than 6 oz of lean meat, poultry, or fish each day. A 3-oz serving of meat is about the same size as a deck of cards. One egg equals 1 oz. ? 2 servings of low-fat dairy each day. ? A serving of nuts, seeds, or beans 5 times each week. ? Heart-healthy fats. Healthy fats called Omega-3 fatty acids are found in foods such as flaxseeds and coldwater fish, like sardines, salmon, and mackerel.  Limit how much you eat of the following: ? Canned or prepackaged foods. ? Food that is high in trans fat, such as fried foods. ? Food that is high in saturated fat, such as fatty meat. ? Sweets, desserts, sugary drinks,  and other foods with added sugar. ? Full-fat dairy products.  Do not salt foods before eating.  Try to eat at least 2 vegetarian meals each week.  Eat more home-cooked food and less restaurant, buffet, and fast food.  When eating at a restaurant, ask that your food be prepared with less salt or no salt, if possible. What foods are recommended? The items listed may not be a complete list. Talk with your dietitian about what dietary choices are best for you. Grains Whole-grain or whole-wheat bread. Whole-grain or whole-wheat pasta. Brown rice. Orpah Cobbatmeal. Quinoa. Bulgur. Whole-grain and low-sodium cereals. Pita bread. Low-fat, low-sodium crackers. Whole-wheat flour tortillas. Vegetables Fresh or frozen vegetables (raw, steamed, roasted, or grilled). Low-sodium or reduced-sodium tomato and vegetable juice. Low-sodium or reduced-sodium tomato  sauce and tomato paste. Low-sodium or reduced-sodium canned vegetables. Fruits All fresh, dried, or frozen fruit. Canned fruit in natural juice (without added sugar). Meat and other protein foods Skinless chicken or [place]. Ground chicken or [place]. Pork with fat trimmed off. Fish and seafood. Egg whites. Dried beans, peas, or lentils. Unsalted nuts, nut butters, and seeds. Unsalted canned beans. Lean cuts of beef with fat trimmed off. Low-sodium, lean deli meat. Dairy Low-fat (1%) or fat-free (skim) milk. Fat-free, low-fat, or reduced-fat cheeses. Nonfat, low-sodium ricotta or cottage cheese. Low-fat or nonfat yogurt. Low-fat, low-sodium cheese. Fats and oils Soft margarine without trans fats. Vegetable oil. Low-fat, reduced-fat, or light mayonnaise and salad dressings (reduced-sodium). Canola, safflower, olive, soybean, and sunflower oils. Avocado. Seasoning and other foods Herbs. Spices. Seasoning mixes without salt. Unsalted popcorn and pretzels. Fat-free sweets. What foods are not recommended? The items listed may not be a complete list. Talk with your  dietitian about what dietary choices are best for you. Grains Baked goods made with fat, such as croissants, muffins, or some breads. Dry pasta or rice meal packs. Vegetables Creamed or fried vegetables. Vegetables in a cheese sauce. Regular canned vegetables (not low-sodium or reduced-sodium). Regular canned tomato sauce and paste (not low-sodium or reduced-sodium). Regular tomato and vegetable juice (not low-sodium or reduced-sodium). Rosita Fire. Olives. Fruits Canned fruit in a light or heavy syrup. Fried fruit. Fruit in cream or butter sauce. Meat and other protein foods Fatty cuts of meat. Ribs. Fried meat. Tomasa Blase. Sausage. Bologna and other processed lunch meats. Salami. Fatback. Hotdogs. Bratwurst. Salted nuts and seeds. Canned beans with added salt. Canned or smoked fish. Whole eggs or egg yolks. Chicken or [place] with skin. Dairy Whole or 2% milk, cream, and half-and-half. Whole or full-fat cream cheese. Whole-fat or sweetened yogurt. Full-fat cheese. Nondairy creamers. Whipped toppings. Processed cheese and cheese spreads. Fats and oils Butter. Stick margarine. Lard. Shortening. Ghee. Bacon fat. Tropical oils, such as coconut, palm kernel, or palm oil. Seasoning and other foods Salted popcorn and pretzels. Onion salt, garlic salt, seasoned salt, table salt, and sea salt. Worcestershire sauce. Tartar sauce. Barbecue sauce. Teriyaki sauce. Soy sauce, including reduced-sodium. Steak sauce. Canned and packaged gravies. Fish sauce. Oyster sauce. Cocktail sauce. Horseradish that you find on the shelf. Ketchup. Mustard. Meat flavorings and tenderizers. Bouillon cubes. Hot sauce and Tabasco sauce. Premade or packaged marinades. Premade or packaged taco seasonings. Relishes. Regular salad dressings. Where to find more information:  National Heart, Lung, and Blood Institute: PopSteam.is  American Heart Association: www.heart.org Summary  The DASH eating plan is a healthy eating plan that has  been shown to reduce high blood pressure (hypertension). It may also reduce your risk for type 2 diabetes, heart disease, and stroke.  With the DASH eating plan, you should limit salt (sodium) intake to 2,300 mg a day. If you have hypertension, you may need to reduce your sodium intake to 1,500 mg a day.  When on the DASH eating plan, aim to eat more fresh fruits and vegetables, whole grains, lean proteins, low-fat dairy, and heart-healthy fats.  Work with your health care provider or diet and nutrition specialist (dietitian) to adjust your eating plan to your individual calorie needs. This information is not intended to replace advice given to you by your health care provider. Make sure you discuss any questions you have with your health care provider. Document Released: 07/16/2011 Document Revised: 07/20/2016 Document Reviewed: 07/20/2016 Elsevier Interactive Patient Education  2018 [employer].  Hypertension Hypertension, commonly called  high blood pressure, is when the force of blood pumping through the arteries is too strong. The arteries are the blood vessels that carry blood from the heart throughout the body. Hypertension forces the heart to work harder to pump blood and may cause arteries to become narrow or stiff. Having untreated or uncontrolled hypertension can cause heart attacks, strokes, kidney disease, and other problems. A blood pressure reading consists of a higher number over a lower number. Ideally, your blood pressure should be below 120/80. The first ("top") number is called the systolic pressure. It is a measure of the pressure in your arteries as your heart beats. The second ("bottom") number is called the diastolic pressure. It is a measure of the pressure in your arteries as the heart relaxes. What are the causes? The cause of this condition is not known. What increases the risk? Some risk factors for high blood pressure are under your control. Others are not. Factors  you can change  Smoking.  Having type 2 diabetes mellitus, high cholesterol, or both.  Not getting enough exercise or physical activity.  Being overweight.  Having too much fat, sugar, calories, or salt (sodium) in your diet.  Drinking too much alcohol. Factors that are difficult or impossible to change  Having chronic kidney disease.  Having a family history of high blood pressure.  Age. Risk increases with age.  Race. You may be at higher risk if you are African-American.  Gender. Men are at higher risk than women before age 76. After age 68, women are at higher risk than men.  Having obstructive sleep apnea.  Stress. What are the signs or symptoms? Extremely high blood pressure (hypertensive crisis) may cause:  Headache.  Anxiety.  Shortness of breath.  Nosebleed.  Nausea and vomiting.  Severe chest pain.  Jerky movements you cannot control (seizures).  How is this diagnosed? This condition is diagnosed by measuring your blood pressure while you are seated, with your arm resting on a surface. The cuff of the blood pressure monitor will be placed directly against the skin of your upper arm at the level of your heart. It should be measured at least twice using the same arm. Certain conditions can cause a difference in blood pressure between your right and left arms. Certain factors can cause blood pressure readings to be lower or higher than normal (elevated) for a short period of time:  When your blood pressure is higher when you are in a health care provider's office than when you are at home, this is called white coat hypertension. Most people with this condition do not need medicines.  When your blood pressure is higher at home than when you are in a health care provider's office, this is called masked hypertension. Most people with this condition may need medicines to control blood pressure.  If you have a high blood pressure reading during one visit or you  have normal blood pressure with other risk factors:  You may be asked to return on a different day to have your blood pressure checked again.  You may be asked to monitor your blood pressure at home for 1 week or longer.  If you are diagnosed with hypertension, you may have other blood or imaging tests to help your health care provider understand your overall risk for other conditions. How is this treated? This condition is treated by making healthy lifestyle changes, such as eating healthy foods, exercising more, and reducing your alcohol intake. Your health care provider  may prescribe medicine if lifestyle changes are not enough to get your blood pressure under control, and if:  Your systolic blood pressure is above 130.  Your diastolic blood pressure is above 80.  Your personal target blood pressure may vary depending on your medical conditions, your age, and other factors. Follow these instructions at home: Eating and drinking  Eat a diet that is high in fiber and potassium, and low in sodium, added sugar, and fat. An example eating plan is called the DASH (Dietary Approaches to Stop Hypertension) diet. To eat this way: ? Eat plenty of fresh fruits and vegetables. Try to fill half of your plate at each meal with fruits and vegetables. ? Eat whole grains, such as whole wheat pasta, brown rice, or whole grain bread. Fill about one quarter of your plate with whole grains. ? Eat or drink low-fat dairy products, such as skim milk or low-fat yogurt. ? Avoid fatty cuts of meat, processed or cured meats, and poultry with skin. Fill about one quarter of your plate with lean proteins, such as fish, chicken without skin, beans, eggs, and tofu. ? Avoid premade and processed foods. These tend to be higher in sodium, added sugar, and fat.  Reduce your daily sodium intake. Most people with hypertension should eat less than 1,500 mg of sodium a day.  Limit alcohol intake to no more than 1 drink a day  for nonpregnant women and 2 drinks a day for men. One drink equals 12 oz of beer, 5 oz of wine, or 1 oz of hard liquor. Lifestyle  Work with your health care provider to maintain a healthy body weight or to lose weight. Ask what an ideal weight is for you.  Get at least 30 minutes of exercise that causes your heart to beat faster (aerobic exercise) most days of the week. Activities may include walking, swimming, or biking.  Include exercise to strengthen your muscles (resistance exercise), such as pilates or lifting weights, as part of your weekly exercise routine. Try to do these types of exercises for 30 minutes at least 3 days a week.  Do not use any products that contain nicotine or tobacco, such as cigarettes and e-cigarettes. If you need help quitting, ask your health care provider.  Monitor your blood pressure at home as told by your health care provider.  Keep all follow-up visits as told by your health care provider. This is important. Medicines  Take over-the-counter and prescription medicines only as told by your health care provider. Follow directions carefully. Blood pressure medicines must be taken as prescribed.  Do not skip doses of blood pressure medicine. Doing this puts you at risk for problems and can make the medicine less effective.  Ask your health care provider about side effects or reactions to medicines that you should watch for. Contact a health care provider if:  You think you are having a reaction to a medicine you are taking.  You have headaches that keep coming back (recurring).  You feel dizzy.  You have swelling in your ankles.  You have trouble with your vision. Get help right away if:  You develop a severe headache or confusion.  You have unusual weakness or numbness.  You feel faint.  You have severe pain in your chest or abdomen.  You vomit repeatedly.  You have trouble breathing. Summary  Hypertension is when the force of blood  pumping through your arteries is too strong. If this condition is not controlled, it may  put you at risk for serious complications.  Your personal target blood pressure may vary depending on your medical conditions, your age, and other factors. For most people, a normal blood pressure is less than 120/80.  Hypertension is treated with lifestyle changes, medicines, or a combination of both. Lifestyle changes include weight loss, eating a healthy, low-sodium diet, exercising more, and limiting alcohol. This information is not intended to replace advice given to you by your health care provider. Make sure you discuss any questions you have with your health care provider. Document Released: 07/27/2005 Document Revised: 06/24/2016 Document Reviewed: 06/24/2016 Elsevier Interactive Patient Education  [employer].

## 2018-04-21 NOTE — Progress Notes (Signed)
Chief Complaint  Patient presents with  . Establish Care   NP with fiance Bonita today  1. HTN uncontrolled w/o sx's repeat BP 166/100 BP elevated since 2016 not on any medications  2. DM 2 A1C down to 6.6 per pt was >15 and 10.9 11/02/17 need to get copy of labs former PCP on ozempic  0.25 weekly, Metformin 850 bid and tid, Glipizide ER 10 mg qd he stopped taking oral medications but is taking lipitor 40 mg -f/u endocrine [facility] 06/28/18   3. Strep throat had 01/24/18 sore throat resolved.  4. glajcoma noted f/u [facility] 06/28/18 using eye drops at night   5. Alcohol heavy use on weekends with friends he reports he at one time was in [place] ? reason  Review of Systems  Constitutional: Negative for weight loss.  HENT: Negative for hearing loss and sore throat.   Eyes: Negative for blurred vision.  Respiratory: Negative for shortness of breath.   Cardiovascular: Negative for chest pain.  Gastrointestinal: Negative for abdominal pain.  Skin: Negative for rash.  Neurological: Negative for headaches.  Psychiatric/Behavioral: Negative for depression.   Past Medical History:  Diagnosis Date  . Diabetes mellitus without complication ([place])   . Difficult intubation   . GERD (gastroesophageal reflux disease)    OCC- TUMS  . Glaucoma   . Hypercholesteremia   . Hypertension   . Strep throat    01/24/18    Past Surgical History:  Procedure Laterality Date  . HERNIA REPAIR    . INSERTION OF MESH N/A 10/22/2017   Procedure: INSERTION OF MESH;  Surgeon: Vickie Epley, MD;  Location: [facility] ORS;  Service: General;  Laterality: N/A;  . UMBILICAL HERNIA REPAIR N/A 10/22/2017   Procedure: HERNIA REPAIR UMBILICAL ADULT WITH MESH;  Surgeon: Vickie Epley, MD;  Location: [facility] ORS;  Service: General;  Laterality: N/A;   Family History  Problem Relation Age of Onset  . Diabetes Father   . Hyperlipidemia Father   . Hypertension Father   . Kidney disease Father   . Diabetes Paternal Uncle     Social History   Socioeconomic History  . Marital status: Divorced    Spouse name: Not on file  . Number of children: Not on file  . Years of education: Not on file  . Highest education level: Not on file  Occupational History  . Not on file  Social Needs  . Financial resource strain: Not on file  . Food insecurity:    Worry: Not on file    Inability: Not on file  . Transportation needs:    Medical: Not on file    Non-medical: Not on file  Tobacco Use  . Smoking status: Former Smoker    Packs/day: 0.50    Years: 27.00    Pack years: 13.50    Types: Cigarettes    Last attempt to quit: 10/16/2002    Years since quitting: 15.5  . Smokeless tobacco: Never Used  . Tobacco comment: age [age] 1/2 ppd quit age 6   Substance and Sexual Activity  . Alcohol use: Yes    Comment: occasionally  . Drug use: No  . Sexual activity: Not on file  Lifestyle  . Physical activity:    Days per week: Not on file    Minutes per session: Not on file  . Stress: Not on file  Relationships  . Social connections:    Talks on phone: Not on file    Gets together:  Not on file    Attends religious service: Not on file    Active member of club or organization: Not on file    Attends meetings of clubs or organizations: Not on file    Relationship status: Not on file  . Intimate partner violence:    Fear of current or ex partner: Not on file    Emotionally abused: Not on file    Physically abused: Not on file    Forced sexual activity: Not on file  Other Topics Concern  . 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 atorvastatin (LIPITOR) 40 MG tablet Take 40 mg by mouth daily at 6 PM.  . latanoprost (XALATAN) 0.005 % ophthalmic solution INSTILL 1 DROP INTO EACH EYE NIGHTLY  . Semaglutide 0.25 or 0.5 MG/DOSE SOPN Inject 0.25 mg into the skin every 7 (seven)  days.   No Known Allergies Recent Results (from the past 2160 hour(s))  Rapid Strep Screen ([facility] ONLY)     Status: Abnormal   Collection Time: 01/24/18  5:00 PM  Result Value Ref Range   Streptococcus, Group A Screen (Direct) POSITIVE (A) NEGATIVE    Comment: Performed at [facility], [address], [place], [place] [address]   Objective  Body mass index is 32.03 kg/m. Wt Readings from Last 3 Encounters:  04/21/18 207 lb 9.6 oz (94.2 kg)  01/24/18 204 lb (92.5 kg)  01/21/18 204 lb (92.5 kg)   Temp Readings from Last 3 Encounters:  04/21/18 98.6 F (37 C) (Oral)  01/24/18 (!) 100.4 F (38 C) (Oral)  01/21/18 98.1 F (36.7 C) (Oral)   BP Readings from Last 3 Encounters:  04/21/18 (!) 166/100  01/24/18 (!) 146/88  01/21/18 138/72   Pulse Readings from Last 3 Encounters:  04/21/18 70  01/24/18 (!) 116  01/21/18 74    Physical Exam  Constitutional: He is oriented to person, place, and time. Vital signs are normal. He appears well-developed and well-nourished. He is cooperative.  HENT:  Head: Normocephalic and atraumatic.  Mouth/Throat: Oropharynx is clear and moist and mucous membranes are normal.  Eyes: Pupils are equal, round, and reactive to light. Conjunctivae are normal.  Cardiovascular: Normal rate, regular rhythm and normal heart sounds.  Pulmonary/Chest: Effort normal and breath sounds normal.  Neurological: He is alert and oriented to person, place, and time. Gait normal.  Skin: Skin is warm, dry and intact.  Psychiatric: He has a normal mood and affect. His speech is normal and behavior is normal. Judgment and thought content normal. Cognition and memory are normal.  Nursing note and vitals reviewed.   Assessment   1. DM 2  2. HTN/HLD 3. Glaucoma  4. C/w alcohol overuse  5. HM Plan   1. Stopped metformin and glipizide on his own  On ozempic 0.25 mg weekly  Get labs per pt had done this week former PCP  F/[facility] eye [facility] 06/28/18  Do  foot exam in future  Consider pna 23 vaccine in future  On statin  Added ARB today see below   2. Add losartan-hctz 50-12.5 take 1/2 pill x 3 days then increase to 1 pill qam  F/u in 2 weeks  3. Cont drops encouraged compliance  4. rec reduce etoh intake  5.  Declines flu shot Tdap per pt had at [place] and get records  Consider shingrix vaccine in future  Consider check MMR, hep B/C in future if has not been done   Check records for PSA will need DRE in future  Never had colonoscopy disc'ed today will try to get pt to consider in the future  Former smoker  1/2 ppd quit age 53 smoked age [age]   Provider: Dr. Olivia Mackie McLean-Scocuzza-Internal Medicine

## 2018-04-22 ENCOUNTER — Telehealth: Payer: Self-pay | Admitting: Internal Medicine

## 2018-04-22 MED ORDER — LOVASTATIN 20 MG PO TABS: 20.0000 mg | ORAL_TABLET | Freq: Every day | ORAL | Status: DC

## 2018-04-22 MED ORDER — LOSARTAN POTASSIUM-HCTZ 100-25 MG PO TABS: 0.5000 | ORAL_TABLET | Freq: Every day | ORAL | 0 refills | Status: DC

## 2018-04-22 NOTE — Addendum Note (Signed)
Addended by: Quentin OreMCLEAN-SCOCUZZA, Masiel Gentzler on: 04/22/2018 08:11 AM   Modules accepted: Orders

## 2018-04-22 NOTE — Telephone Encounter (Signed)
Left message for patient to return call back. [facility] may give information.  

## 2018-04-22 NOTE — Telephone Encounter (Signed)
Call pt do not have losartan hctz 50-12.5 to cut in 1/3 x 3 days then take 1 pill  Sent 100 mg-25 cut in 1/2 and take daily they do not have lower dose available at pharmacy  TMS

## 2018-04-26 ENCOUNTER — Telehealth: Payer: Self-pay | Admitting: Internal Medicine

## 2018-04-26 NOTE — Telephone Encounter (Signed)
[facility] forwarded 22 pages to Dr Mclean-Scocuzza French Anaracy

## 2018-05-03 ENCOUNTER — Ambulatory Visit (INDEPENDENT_AMBULATORY_CARE_PROVIDER_SITE_OTHER): Payer: Commercial Managed Care - PPO | Admitting: Internal Medicine

## 2018-05-03 ENCOUNTER — Encounter: Payer: Self-pay | Admitting: Internal Medicine

## 2018-05-03 VITALS — BP 142/86 | HR 75 | Temp 98.5°F | Ht 67.5 in | Wt 206.0 lb

## 2018-05-03 DIAGNOSIS — Z125 Encounter for screening for malignant neoplasm of prostate: Secondary | ICD-10-CM

## 2018-05-03 DIAGNOSIS — I10 Essential (primary) hypertension: Secondary | ICD-10-CM

## 2018-05-03 DIAGNOSIS — Z0184 Encounter for antibody response examination: Secondary | ICD-10-CM

## 2018-05-03 DIAGNOSIS — Z113 Encounter for screening for infections with a predominantly sexual mode of transmission: Secondary | ICD-10-CM

## 2018-05-03 DIAGNOSIS — E119 Type 2 diabetes mellitus without complications: Secondary | ICD-10-CM

## 2018-05-03 DIAGNOSIS — E559 Vitamin D deficiency, unspecified: Secondary | ICD-10-CM

## 2018-05-03 DIAGNOSIS — Z1329 Encounter for screening for other suspected endocrine disorder: Secondary | ICD-10-CM | POA: Diagnosis not present

## 2018-05-03 DIAGNOSIS — Z1159 Encounter for screening for other viral diseases: Secondary | ICD-10-CM

## 2018-05-03 MED ORDER — LOSARTAN POTASSIUM-HCTZ 100-25 MG PO TABS: 1.0000 | ORAL_TABLET | Freq: Every day | ORAL | 3 refills | Status: DC

## 2018-05-03 NOTE — Progress Notes (Signed)
Pre visit review using our clinic review tool, if applicable. No additional management support is needed unless otherwise documented below in the visit note. 

## 2018-05-03 NOTE — Progress Notes (Addendum)
Chief Complaint  Patient presents with  . Follow-up   F/u  1. HTN improved on losartan 100-htcz 25 he does not eat pork or beef  2. DM 2 improved on ozempic weekly f/u endocrine and eye [facility] 06/28/18  3. H/o +RPR will check furhter labs to w/u   Review of Systems  Constitutional: Negative for weight loss.  HENT: Negative for hearing loss.   Eyes: Negative for blurred vision.  Respiratory: Negative for shortness of breath.   Cardiovascular: Negative for chest pain.  Gastrointestinal: Negative for abdominal pain.  Skin: Negative for rash.  Neurological: Negative for headaches.  Psychiatric/Behavioral: Negative for depression.   Past Medical History:  Diagnosis Date  . Diabetes mellitus without complication ([place])   . Difficult intubation   . GERD (gastroesophageal reflux disease)    OCC- TUMS  . Glaucoma   . Hypercholesteremia   . Hypertension   . Strep throat    01/24/18    Past Surgical History:  Procedure Laterality Date  . HERNIA REPAIR    . INSERTION OF MESH N/A 10/22/2017   Procedure: INSERTION OF MESH;  Surgeon: Vickie Epley, MD;  Location: [facility] ORS;  Service: General;  Laterality: N/A;  . UMBILICAL HERNIA REPAIR N/A 10/22/2017   Procedure: HERNIA REPAIR UMBILICAL ADULT WITH MESH;  Surgeon: Vickie Epley, MD;  Location: [facility] ORS;  Service: General;  Laterality: N/A;   Family History  Problem Relation Age of Onset  . Diabetes Father   . Hyperlipidemia Father   . Hypertension Father   . Kidney disease Father   . Diabetes Paternal Uncle    Social History   Socioeconomic History  . Marital status: Divorced    Spouse name: Not on file  . Number of children: Not on file  . Years of education: Not on file  . Highest education level: Not on file  Occupational History  . Not on file  Social Needs  . Financial resource strain: Not on file  . Food insecurity:    Worry: Not on file    Inability: Not on file  . Transportation needs:    Medical: Not on file     Non-medical: Not on file  Tobacco Use  . Smoking status: Former Smoker    Packs/day: 0.50    Years: 27.00    Pack years: 13.50    Types: Cigarettes    Last attempt to quit: 10/16/2002    Years since quitting: 15.5  . Smokeless tobacco: Never Used  . Tobacco comment: age [age] 1/2 ppd quit age 59   Substance and Sexual Activity  . Alcohol use: Yes    Comment: occasionally  . Drug use: No  . Sexual activity: Not on file  Lifestyle  . Physical activity:    Days per week: Not on file    Minutes per session: Not on file  . Stress: Not on file  Relationships  . Social connections:    Talks on phone: Not on file    Gets together: Not on file    Attends religious service: Not on file    Active member of club or organization: Not on file    Attends meetings of clubs or organizations: Not on file    Relationship status: Not on file  . Intimate partner violence:    Fear of current or ex partner: Not on file    Emotionally abused: Not on file    Physically abused: Not on file    Forced sexual activity:  Not on file  Other Topics Concern  . 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 atorvastatin (LIPITOR) 40 MG tablet Take 40 mg by mouth daily at 6 PM.  . latanoprost (XALATAN) 0.005 % ophthalmic solution INSTILL 1 DROP INTO EACH EYE NIGHTLY  . losartan-hydrochlorothiazide (HYZAAR) 100-25 MG tablet Take 0.5 tablets by mouth daily.  Marland Kitchen lovastatin (MEVACOR) 20 MG tablet Take 1 tablet (20 mg total) by mouth at bedtime.  . Semaglutide 0.25 or 0.5 MG/DOSE SOPN Inject 0.25 mg into the skin every 7 (seven) days.   No Known Allergies No results found for this or any previous visit (from the past 2160 hour(s)). Objective  Body mass index is 31.79 kg/m. Wt Readings from Last 3 Encounters:  05/03/18 206 lb (93.4 kg)  04/21/18 207 lb 9.6 oz (94.2 kg)   01/24/18 204 lb (92.5 kg)   Temp Readings from Last 3 Encounters:  05/03/18 98.5 F (36.9 C) (Oral)  04/21/18 98.6 F (37 C) (Oral)  01/24/18 (!) 100.4 F (38 C) (Oral)   BP Readings from Last 3 Encounters:  05/03/18 (!) 142/86  04/21/18 (!) 166/100  01/24/18 (!) 146/88   Pulse Readings from Last 3 Encounters:  05/03/18 75  04/21/18 70  01/24/18 (!) 116    Physical Exam  Constitutional: He is oriented to person, place, and time. Vital signs are normal. He appears well-developed and well-nourished. He is cooperative.  HENT:  Head: Normocephalic and atraumatic.  Mouth/Throat: Oropharynx is clear and moist and mucous membranes are normal.  Eyes: Pupils are equal, round, and reactive to light. Conjunctivae are normal.  Cardiovascular: Normal rate, regular rhythm and normal heart sounds.  Pulmonary/Chest: Effort normal and breath sounds normal.  Neurological: He is alert and oriented to person, place, and time. Gait normal.  Skin: Skin is warm, dry and intact.  Psychiatric: He has a normal mood and affect. His speech is normal and behavior is normal. Judgment and thought content normal. Cognition and memory are normal.  Nursing note and vitals reviewed.   Assessment   1. HTN  2. DM 2  3. +RPR 4. HM Plan   1. Cont meds  2. [facility]  sch fasting labs  On statin  On ozempic weekly  3. Check RPR and HIV  4.  Declines flu shot Tdap per pt had at [place] and get records  Consider shingrix vaccine in future Consider pna 23 in future   Consider check MMR, hep B/C in future if has not been done   Check records for PSA will need DRE in future  Never had colonoscopy disc'ed today will try to get pt to consider in the future  Former smoker  1/2 ppd quit age 71 smoked age [age]  hep C neg 09/13/17   Reviewed records [place]  fobt 09/13/17 negative fecal BMET 09/13/17 Cr 0.92 lipid TC 269, TGs 104, HDL 56, LDL 192  Hep A Ab  igm Neg, HBsAg neg, hep B coreAb IgM neg  HCV neg 09/13/17  urien protein had 09/13/17 negative  CT/G/T negative  A1C 09/13/17 15.1  RPR T pallidum Ab + immunoblot indeterminate  -went to HD 12/20/17 further w/u done per note   A1C 7.7 02/22/17   Provider: Dr.  Olivia Mackie McLean-Scocuzza-Internal Medicine

## 2018-05-03 NOTE — Patient Instructions (Addendum)
Blood pressure is better Dr. French Anaracy is happy keep taking the medications   Hypertension Hypertension, commonly called high blood pressure, is when the force of blood pumping through the arteries is too strong. The arteries are the blood vessels that carry blood from the heart throughout the body. Hypertension forces the heart to work harder to pump blood and may cause arteries to become narrow or stiff. Having untreated or uncontrolled hypertension can cause heart attacks, strokes, kidney disease, and other problems. A blood pressure reading consists of a higher number over a lower number. Ideally, your blood pressure should be below 120/80. The first ("top") number is called the systolic pressure. It is a measure of the pressure in your arteries as your heart beats. The second ("bottom") number is called the diastolic pressure. It is a measure of the pressure in your arteries as the heart relaxes. What are the causes? The cause of this condition is not known. What increases the risk? Some risk factors for high blood pressure are under your control. Others are not. Factors you can change  Smoking.  Having type 2 diabetes mellitus, high cholesterol, or both.  Not getting enough exercise or physical activity.  Being overweight.  Having too much fat, sugar, calories, or salt (sodium) in your diet.  Drinking too much alcohol. Factors that are difficult or impossible to change  Having chronic kidney disease.  Having a family history of high blood pressure.  Age. Risk increases with age.  Race. You may be at higher risk if you are African-American.  Gender. Men are at higher risk than women before age 54. After age 54, women are at higher risk than men.  Having obstructive sleep apnea.  Stress. What are the signs or symptoms? Extremely high blood pressure (hypertensive crisis) may cause:  Headache.  Anxiety.  Shortness of breath.  Nosebleed.  Nausea and vomiting.  Severe  chest pain.  Jerky movements you cannot control (seizures).  How is this diagnosed? This condition is diagnosed by measuring your blood pressure while you are seated, with your arm resting on a surface. The cuff of the blood pressure monitor will be placed directly against the skin of your upper arm at the level of your heart. It should be measured at least twice using the same arm. Certain conditions can cause a difference in blood pressure between your right and left arms. Certain factors can cause blood pressure readings to be lower or higher than normal (elevated) for a short period of time:  When your blood pressure is higher when you are in a health care provider's office than when you are at home, this is called white coat hypertension. Most people with this condition do not need medicines.  When your blood pressure is higher at home than when you are in a health care provider's office, this is called masked hypertension. Most people with this condition may need medicines to control blood pressure.  If you have a high blood pressure reading during one visit or you have normal blood pressure with other risk factors:  You may be asked to return on a different day to have your blood pressure checked again.  You may be asked to monitor your blood pressure at home for 1 week or longer.  If you are diagnosed with hypertension, you may have other blood or imaging tests to help your health care provider understand your overall risk for other conditions. How is this treated? This condition is treated by making healthy lifestyle  changes, such as eating healthy foods, exercising more, and reducing your alcohol intake. Your health care provider may prescribe medicine if lifestyle changes are not enough to get your blood pressure under control, and if:  Your systolic blood pressure is above 130.  Your diastolic blood pressure is above 80.  Your personal target blood pressure may vary depending on  your medical conditions, your age, and other factors. Follow these instructions at home: Eating and drinking  Eat a diet that is high in fiber and potassium, and low in sodium, added sugar, and fat. An example eating plan is called the DASH (Dietary Approaches to Stop Hypertension) diet. To eat this way: ? Eat plenty of fresh fruits and vegetables. Try to fill half of your plate at each meal with fruits and vegetables. ? Eat whole grains, such as whole wheat pasta, brown rice, or whole grain bread. Fill about one quarter of your plate with whole grains. ? Eat or drink low-fat dairy products, such as skim milk or low-fat yogurt. ? Avoid fatty cuts of meat, processed or cured meats, and poultry with skin. Fill about one quarter of your plate with lean proteins, such as fish, chicken without skin, beans, eggs, and tofu. ? Avoid premade and processed foods. These tend to be higher in sodium, added sugar, and fat.  Reduce your daily sodium intake. Most people with hypertension should eat less than 1,500 mg of sodium a day.  Limit alcohol intake to no more than 1 drink a day for nonpregnant women and 2 drinks a day for men. One drink equals 12 oz of beer, 5 oz of wine, or 1 oz of hard liquor. Lifestyle  Work with your health care provider to maintain a healthy body weight or to lose weight. Ask what an ideal weight is for you.  Get at least 30 minutes of exercise that causes your heart to beat faster (aerobic exercise) most days of the week. Activities may include walking, swimming, or biking.  Include exercise to strengthen your muscles (resistance exercise), such as pilates or lifting weights, as part of your weekly exercise routine. Try to do these types of exercises for 30 minutes at least 3 days a week.  Do not use any products that contain nicotine or tobacco, such as cigarettes and e-cigarettes. If you need help quitting, ask your health care provider.  Monitor your blood pressure at home  as told by your health care provider.  Keep all follow-up visits as told by your health care provider. This is important. Medicines  Take over-the-counter and prescription medicines only as told by your health care provider. Follow directions carefully. Blood pressure medicines must be taken as prescribed.  Do not skip doses of blood pressure medicine. Doing this puts you at risk for problems and can make the medicine less effective.  Ask your health care provider about side effects or reactions to medicines that you should watch for. Contact a health care provider if:  You think you are having a reaction to a medicine you are taking.  You have headaches that keep coming back (recurring).  You feel dizzy.  You have swelling in your ankles.  You have trouble with your vision. Get help right away if:  You develop a severe headache or confusion.  You have unusual weakness or numbness.  You feel faint.  You have severe pain in your chest or abdomen.  You vomit repeatedly.  You have trouble breathing. Summary  Hypertension is when the force  of blood pumping through your arteries is too strong. If this condition is not controlled, it may put you at risk for serious complications.  Your personal target blood pressure may vary depending on your medical conditions, your age, and other factors. For most people, a normal blood pressure is less than 120/80.  Hypertension is treated with lifestyle changes, medicines, or a combination of both. Lifestyle changes include weight loss, eating a healthy, low-sodium diet, exercising more, and limiting alcohol. This information is not intended to replace advice given to you by your health care provider. Make sure you discuss any questions you have with your health care provider. Document Released: 07/27/2005 Document Revised: 06/24/2016 Document Reviewed: 06/24/2016 Elsevier Interactive Patient Education  [employer].

## 2018-05-04 ENCOUNTER — Other Ambulatory Visit (INDEPENDENT_AMBULATORY_CARE_PROVIDER_SITE_OTHER): Payer: Commercial Managed Care - PPO

## 2018-05-04 DIAGNOSIS — Z0184 Encounter for antibody response examination: Secondary | ICD-10-CM

## 2018-05-04 DIAGNOSIS — Z1329 Encounter for screening for other suspected endocrine disorder: Secondary | ICD-10-CM

## 2018-05-04 DIAGNOSIS — Z125 Encounter for screening for malignant neoplasm of prostate: Secondary | ICD-10-CM

## 2018-05-04 DIAGNOSIS — Z1159 Encounter for screening for other viral diseases: Secondary | ICD-10-CM

## 2018-05-04 DIAGNOSIS — E559 Vitamin D deficiency, unspecified: Secondary | ICD-10-CM

## 2018-05-04 DIAGNOSIS — I10 Essential (primary) hypertension: Secondary | ICD-10-CM

## 2018-05-04 DIAGNOSIS — E119 Type 2 diabetes mellitus without complications: Secondary | ICD-10-CM

## 2018-05-04 DIAGNOSIS — Z113 Encounter for screening for infections with a predominantly sexual mode of transmission: Secondary | ICD-10-CM

## 2018-05-04 NOTE — Addendum Note (Signed)
Addended by: WIGGINS, Darnelle Derrick N on: 05/04/2018 03:19 PM   Modules accepted: Orders  

## 2018-05-04 NOTE — Addendum Note (Signed)
Addended by: Penne Lash on: 05/04/2018 03:19 PM   Modules accepted: Orders

## 2018-05-05 LAB — URINALYSIS, ROUTINE W REFLEX MICROSCOPIC
BILIRUBIN UA: NEGATIVE
Glucose, UA: NEGATIVE
Ketones, UA: NEGATIVE
Leukocytes, UA: NEGATIVE
Nitrite, UA: NEGATIVE
Protein, UA: NEGATIVE
RBC UA: NEGATIVE
Specific Gravity, UA: 1.018 (ref 1.005–1.030)
UUROB: 0.2 mg/dL (ref 0.2–1.0)
pH, UA: 5 (ref 5.0–7.5)

## 2018-05-05 LAB — PROTEIN / CREATININE RATIO, URINE
Creatinine, Urine: 154 mg/dL
PROTEIN UR: 10.1 mg/dL
Protein/Creat Ratio: 66 mg/g creat (ref 0–200)

## 2018-05-06 ENCOUNTER — Other Ambulatory Visit: Payer: Self-pay | Admitting: Internal Medicine

## 2018-05-06 DIAGNOSIS — E785 Hyperlipidemia, unspecified: Secondary | ICD-10-CM

## 2018-05-06 MED ORDER — ATORVASTATIN CALCIUM 40 MG PO TABS: 40.0000 mg | ORAL_TABLET | Freq: Every day | ORAL | 3 refills | Status: DC

## 2018-05-07 LAB — CBC WITH DIFFERENTIAL/PLATELET
Basophils Absolute: 0 10*3/uL (ref 0.0–0.2)
Basos: 0 %
EOS (ABSOLUTE): 0.1 10*3/uL (ref 0.0–0.4)
Eos: 1 %
Hematocrit: 47.6 % (ref 37.5–51.0)
Hemoglobin: 15.8 g/dL (ref 13.0–17.7)
Immature Grans (Abs): 0 10*3/uL (ref 0.0–0.1)
Immature Granulocytes: 0 %
Lymphocytes Absolute: 2.5 10*3/uL (ref 0.7–3.1)
Lymphs: 33 %
MCH: 25.5 pg — ABNORMAL LOW (ref 26.6–33.0)
MCHC: 33.2 g/dL (ref 31.5–35.7)
MCV: 77 fL — ABNORMAL LOW (ref 79–97)
Monocytes Absolute: 0.5 10*3/uL (ref 0.1–0.9)
Monocytes: 6 %
Neutrophils Absolute: 4.6 10*3/uL (ref 1.4–7.0)
Neutrophils: 60 %
Platelets: 280 10*3/uL (ref 150–450)
RBC: 6.19 x10E6/uL — ABNORMAL HIGH (ref 4.14–5.80)
RDW: 15.4 % (ref 12.3–15.4)
WBC: 7.7 10*3/uL (ref 3.4–10.8)

## 2018-05-07 LAB — LIPID PANEL
Chol/HDL Ratio: 4.2 ratio (ref 0.0–5.0)
Cholesterol, Total: 257 mg/dL — ABNORMAL HIGH (ref 100–199)
HDL: 61 mg/dL
LDL Calculated: 159 mg/dL — ABNORMAL HIGH (ref 0–99)
Triglycerides: 183 mg/dL — ABNORMAL HIGH (ref 0–149)
VLDL Cholesterol Cal: 37 mg/dL (ref 5–40)

## 2018-05-07 LAB — COMPREHENSIVE METABOLIC PANEL WITH GFR
ALT: 29 [IU]/L (ref 0–44)
AST: 24 [IU]/L (ref 0–40)
Albumin/Globulin Ratio: 1.4 (ref 1.2–2.2)
Albumin: 4.3 g/dL (ref 3.5–5.5)
Alkaline Phosphatase: 60 [IU]/L (ref 39–117)
BUN/Creatinine Ratio: 14 (ref 9–20)
BUN: 15 mg/dL (ref 6–24)
Bilirubin Total: 0.4 mg/dL (ref 0.0–1.2)
CO2: 22 mmol/L (ref 20–29)
Calcium: 10.5 mg/dL — ABNORMAL HIGH (ref 8.7–10.2)
Chloride: 101 mmol/L (ref 96–106)
Creatinine, Ser: 1.09 mg/dL (ref 0.76–1.27)
GFR calc Af Amer: 88 mL/min/{1.73_m2}
GFR calc non Af Amer: 77 mL/min/{1.73_m2}
Globulin, Total: 3.1 g/dL (ref 1.5–4.5)
Glucose: 109 mg/dL — ABNORMAL HIGH (ref 65–99)
Potassium: 4.1 mmol/L (ref 3.5–5.2)
Sodium: 141 mmol/L (ref 134–144)
Total Protein: 7.4 g/dL (ref 6.0–8.5)

## 2018-05-07 LAB — HEPATITIS B SURFACE ANTIBODY, QUANTITATIVE: Hepatitis B Surf Ab Quant: 3.1 m[IU]/mL — ABNORMAL LOW (ref >9.9)

## 2018-05-07 LAB — HIV ANTIBODY (ROUTINE TESTING W REFLEX): HIV Screen 4th Generation wRfx: NONREACTIVE

## 2018-05-07 LAB — MEASLES/MUMPS/RUBELLA IMMUNITY
MUMPS ABS, IGG: 118 AU/mL (ref >10.9)
Rubella Antibodies, IGG: 4.47 index (ref >0.99)

## 2018-05-07 LAB — HEMOGLOBIN A1C
Est. average glucose Bld gHb Est-mCnc: 146 mg/dL
Hgb A1c MFr Bld: 6.7 % — ABNORMAL HIGH (ref 4.8–5.6)

## 2018-05-07 LAB — TSH: TSH: 1.97 u[IU]/mL (ref 0.450–4.500)

## 2018-05-07 LAB — PSA: Prostate Specific Ag, Serum: 0.6 ng/mL (ref 0.0–4.0)

## 2018-05-07 LAB — VITAMIN D 25 HYDROXY (VIT D DEFICIENCY, FRACTURES): Vit D, 25-Hydroxy: 24 ng/mL — ABNORMAL LOW (ref 30.0–100.0)

## 2018-05-07 LAB — RPR: RPR: NONREACTIVE

## 2018-06-28 DIAGNOSIS — E119 Type 2 diabetes mellitus without complications: Secondary | ICD-10-CM | POA: Diagnosis not present

## 2018-06-28 DIAGNOSIS — H401131 Primary open-angle glaucoma, bilateral, mild stage: Secondary | ICD-10-CM | POA: Diagnosis not present

## 2018-06-29 ENCOUNTER — Other Ambulatory Visit: Payer: Self-pay | Admitting: Internal Medicine

## 2018-06-29 ENCOUNTER — Telehealth: Payer: Self-pay | Admitting: Internal Medicine

## 2018-06-29 DIAGNOSIS — I10 Essential (primary) hypertension: Secondary | ICD-10-CM

## 2018-06-29 MED ORDER — HYDROCHLOROTHIAZIDE 25 MG PO TABS: 25.0000 mg | ORAL_TABLET | Freq: Every day | ORAL | 3 refills | Status: DC

## 2018-06-29 MED ORDER — LOSARTAN POTASSIUM 100 MG PO TABS: 100.0000 mg | ORAL_TABLET | Freq: Every day | ORAL | 3 refills | Status: DC

## 2018-06-29 NOTE — Telephone Encounter (Signed)
Ok to call or send in separately?

## 2018-06-29 NOTE — Telephone Encounter (Signed)
Copied from CRM [id number]. Topic: Quick Communication - See Telephone Encounter >> Jun 29, 2018  2:19 PM Herby AbrahamJohnson, Shiquita C wrote: CRM for notification. See Telephone encounter for: 06/29/18.  Pharmacy called in to be advised. Pt's losartan-hydrochlorothiazide (HYZAAR) 100-25 MG tablet combination  Rx is on back order, they would like to know if it is okay to fill separately?   Please advise.

## 2018-07-06 DIAGNOSIS — H401131 Primary open-angle glaucoma, bilateral, mild stage: Secondary | ICD-10-CM | POA: Diagnosis not present

## 2018-08-02 ENCOUNTER — Ambulatory Visit: Payer: Commercial Managed Care - PPO | Admitting: Internal Medicine

## 2018-08-02 DIAGNOSIS — Z0289 Encounter for other administrative examinations: Secondary | ICD-10-CM

## 2018-08-31 ENCOUNTER — Ambulatory Visit: Payer: Commercial Managed Care - PPO | Admitting: Internal Medicine

## 2018-11-29 ENCOUNTER — Ambulatory Visit (INDEPENDENT_AMBULATORY_CARE_PROVIDER_SITE_OTHER): Payer: Commercial Managed Care - PPO

## 2018-11-29 ENCOUNTER — Ambulatory Visit
Admission: EM | Admit: 2018-11-29 | Discharge: 2018-11-29 | Disposition: A | Discharge status: Home / Self Care | Payer: Commercial Managed Care - PPO | Attending: Family Medicine | Admitting: Family Medicine

## 2018-11-29 ENCOUNTER — Other Ambulatory Visit: Payer: Self-pay

## 2018-11-29 DIAGNOSIS — M19022 Primary osteoarthritis, left elbow: Secondary | ICD-10-CM | POA: Diagnosis not present

## 2018-11-29 DIAGNOSIS — M25522 Pain in left elbow: Secondary | ICD-10-CM

## 2018-11-29 MED ORDER — MELOXICAM 15 MG PO TABS: 15.0000 mg | ORAL_TABLET | Freq: Every day | ORAL | 1 refills | Status: DC | PRN

## 2018-11-29 NOTE — ED Triage Notes (Addendum)
Patient complains of left elbow pain. Patient states that pain has been constant x 5 months after working out, states that it had improved and has worsened again. Pain is worse when trying to straighten arm.

## 2018-11-29 NOTE — Discharge Instructions (Signed)
Medications as prescribed.  If persists, see [facility] or [facility].  Take care  Dr. Adriana Simas

## 2018-11-29 NOTE — ED Provider Notes (Signed)
[facility]    CSN: [id number] Arrival date & time: 11/29/18  1222   History   Chief Complaint Chief Complaint  Patient presents with   Elbow Pain    left   HPI  53 year old male presents with left elbow pain.  Patient reports that he has had ongoing left elbow pain for the past 6 months.  He states that it had improved after he stopped working out due to gym closure.  He states that it has returned and has been worse for the past week.  He reports pain in the center of his elbow and on the medial aspect.  Patient states that he has decreased range of motion.  He is unable to fully extend his elbow.  Exacerbated by certain activities.  Current pain is 7-8/10 in severity.  He has taken ibuprofen without relief.  No other associated symptoms.  No other complaints.  Of note, patient denies injury.  PMH, Surgical Hx, Family Hx, Social History reviewed and updated as below.  Past Medical History:  Diagnosis Date   Diabetes mellitus without complication (HCC)    Difficult intubation    GERD (gastroesophageal reflux disease)    OCC- TUMS   Glaucoma    Hypercholesteremia    Hypertension    Strep throat    01/24/18     Patient Active Problem List   Diagnosis Date Noted   DM2 (diabetes mellitus, type 2) (HCC) 04/21/2018   HTN (hypertension) 04/21/2018   HLD (hyperlipidemia) 04/21/2018   Glaucoma 04/21/2018   Umbilical hernia without obstruction or gangrene     Past Surgical History:  Procedure Laterality Date   HERNIA REPAIR     INSERTION OF MESH N/A 10/22/2017   Procedure: INSERTION OF MESH;  Surgeon: Ancil Linsey, MD;  Location: [facility] OR[facility];  Service: General;  Laterality: N/A;   UMBILICAL HERNIA REPAIR N/A 10/22/2017   Procedure: HERNIA REPAIR UMBILICAL ADULT WITH MESH;  Surgeon: Ancil Linsey, MD;  Location: [facility];  Service: General;  Laterality: N/A;       Home Medications    Prior to Admission medications   Medication Sig Start  Date End Date Taking? Authorizing Provider  atorvastatin (LIPITOR) 40 MG tablet Take 1 tablet (40 mg total) by mouth daily. 05/06/18  Yes McLean-Scocuzza, Pasty Spillers, MD  latanoprost (XALATAN) 0.005 % ophthalmic solution INSTILL 1 DROP INTO EACH EYE NIGHTLY 03/14/18  Yes [provider]  losartan-hydrochlorothiazide (HYZAAR) 100-25 MG tablet Take 1 tablet by mouth daily. 05/03/18  Yes McLean-Scocuzza, Pasty Spillers, MD  Semaglutide 0.25 or 0.5 MG/DOSE SOPN Inject 0.25 mg into the skin every 7 (seven) days. 11/03/17  Yes [provider]  meloxicam (MOBIC) 15 MG tablet Take 1 tablet (15 mg total) by mouth daily as needed for pain. 11/29/18   Tommie Sams, DO    Family History Family History  Problem Relation Age of Onset   Diabetes Father    Hyperlipidemia Father    Hypertension Father    Kidney disease Father    Diabetes Paternal Uncle     Social History Social History   Tobacco Use   Smoking status: Current Every Day Smoker    Packs/day: 0.50    Years: 27.00    Pack years: 13.50    Types: Cigarettes   Smokeless tobacco: Never Used   Tobacco comment: age [age] 1/2 ppd quit age 76   Substance Use Topics   Alcohol use: Yes    Comment: occasionally   Drug  use: No     Allergies   Patient has no known allergies.   Review of Systems Review of Systems  Constitutional: Negative.   Musculoskeletal:       Left elbow pain.   Physical Exam Triage Vital Signs ED Triage Vitals  Enc Vitals Group     BP 11/29/18 1244 (!) 155/82     Pulse Rate 11/29/18 1244 75     Resp 11/29/18 1244 16     Temp 11/29/18 1244 98.5 F (36.9 C)     Temp Source 11/29/18 1244 Oral     SpO2 11/29/18 1244 99 %     Weight 11/29/18 1242 180 lb (81.6 kg)     Height 11/29/18 1242 5' 7.5" (1.715 m)     Head Circumference --      Peak Flow --      Pain Score 11/29/18 1241 8     Pain Loc --      Pain Edu? --      Excl. in GC? --    No data found.  Updated Vital Signs BP (!) 155/82  (BP Location: Right Arm)    Pulse 75    Temp 98.5 F (36.9 C) (Oral)    Resp 16    Ht 5' 7.5" (1.715 m)    Wt 81.6 kg    SpO2 99%    BMI 27.78 kg/m   Visual Acuity Right Eye Distance:   Left Eye Distance:   Bilateral Distance:    Right Eye Near:   Left Eye Near:    Bilateral Near:     Physical Exam Vitals signs and nursing note reviewed.  Constitutional:      General: He is not in acute distress.    Appearance: Normal appearance. He is normal weight.  HENT:     Head: Normocephalic and atraumatic.  Eyes:     General:        Right eye: No discharge.        Left eye: No discharge.     Conjunctiva/sclera: Conjunctivae normal.  Pulmonary:     Effort: Pulmonary effort is normal. No respiratory distress.  Musculoskeletal:     Comments: Left elbow -decreased range of motion with extension.  Mild tenderness on the medial aspect/medial epicondyle.  Skin:    General: Skin is warm.     Findings: No rash.  Neurological:     Mental Status: He is alert.  Psychiatric:        Mood and Affect: Mood normal.        Behavior: Behavior normal.    UC Treatments / Results  Labs (all labs ordered are listed, but only abnormal results are displayed) Labs Reviewed - No data to display  EKG None  Radiology Dg Elbow Complete Left  Result Date: 11/29/2018 CLINICAL DATA:  Left elbow pain for 6 months.  No known injury. EXAM: LEFT ELBOW - COMPLETE 3+ VIEW COMPARISON:  None. FINDINGS: No acute bony or joint abnormality is identified. The patient has advanced osteoarthritis about the joint with joint space narrowing and osteophytosis present. Osteophytosis is worse about the articulation of the ulna and trochlea. A prominent osteophyte is also seen off the posterior aspect of the distal humerus projecting toward the olecranon process. A loose body along the lateral margin of the neck of the radius measures 0.4 cm. There is no joint effusion. Mild calcification in the distal triceps tendon is noted.  IMPRESSION: No acute abnormality. Advanced osteoarthritis about the elbow. Osteophytosis about  the joint, particularly off the coronoid process of the ulna and posterior aspect of the distal humerus could limit range of motion. Small loose body along the neck of the radius is noted. Electronically Signed   By: Drusilla Kannerhomas  Dalessio M.D.   On: 11/29/2018 13:27    Procedures Procedures (including critical care time)  Medications Ordered in UC Medications - No data to display  Initial Impression / Assessment and Plan / UC Course  I have reviewed the triage vital signs and the nursing notes.  Pertinent labs & imaging results that were available during my care of the patient were reviewed by me and considered in my medical decision making (see chart for details).    53 year old male presents with left elbow pain.  X-ray reveals advanced osteoarthritis.  Placed on meloxicam.  Advised to see orthopedics if fails to improves or worsens.  Final Clinical Impressions(s) / UC Diagnoses   Final diagnoses:  Primary osteoarthritis of left elbow     Discharge Instructions     Medications as prescribed.  If persists, see [facility] or [facility] ortho.  Take care  Dr. Adriana Simasook    ED Prescriptions    Medication Sig Dispense Auth. Provider   meloxicam (MOBIC) 15 MG tablet Take 1 tablet (15 mg total) by mouth daily as needed for pain. 30 tablet Tommie Samsook, Iolani Twilley G, DO     Controlled Substance Prescriptions [place] Controlled Substance Registry consulted? Not Applicable   Tommie SamsCook, Marelly Wehrman G, DO 11/29/18 1350

## 2018-12-14 ENCOUNTER — Encounter: Payer: Self-pay | Admitting: Internal Medicine

## 2018-12-14 ENCOUNTER — Ambulatory Visit: Payer: Commercial Managed Care - PPO | Admitting: Internal Medicine

## 2018-12-14 ENCOUNTER — Ambulatory Visit (INDEPENDENT_AMBULATORY_CARE_PROVIDER_SITE_OTHER): Payer: Commercial Managed Care - PPO | Admitting: Internal Medicine

## 2018-12-14 ENCOUNTER — Other Ambulatory Visit: Payer: Self-pay

## 2018-12-14 DIAGNOSIS — M19022 Primary osteoarthritis, left elbow: Secondary | ICD-10-CM | POA: Diagnosis not present

## 2018-12-14 DIAGNOSIS — I10 Essential (primary) hypertension: Secondary | ICD-10-CM | POA: Diagnosis not present

## 2018-12-14 DIAGNOSIS — H409 Unspecified glaucoma: Secondary | ICD-10-CM

## 2018-12-14 DIAGNOSIS — E1165 Type 2 diabetes mellitus with hyperglycemia: Secondary | ICD-10-CM

## 2018-12-14 DIAGNOSIS — E785 Hyperlipidemia, unspecified: Secondary | ICD-10-CM

## 2018-12-14 DIAGNOSIS — Z9114 Patient's other noncompliance with medication regimen: Secondary | ICD-10-CM

## 2018-12-14 DIAGNOSIS — E559 Vitamin D deficiency, unspecified: Secondary | ICD-10-CM

## 2018-12-14 MED ORDER — SEMAGLUTIDE(0.25 OR 0.5MG/DOS) 2 MG/1.5ML ~~LOC~~ SOPN: 0.5000 mg | PEN_INJECTOR | SUBCUTANEOUS | 12 refills | Status: DC

## 2018-12-14 MED ORDER — DICLOFENAC SODIUM 1 % TD GEL: 2.0000 g | Freq: Four times a day (QID) | TRANSDERMAL | 12 refills | Status: DC

## 2018-12-14 NOTE — Progress Notes (Signed)
Virtual Visit via Video Note  I connected with@  on 12/14/18 at  4:00 PM EDT by a video enabled telemedicine application and verified that I am speaking with the correct person using two identifiers.  Location patient: home Location provider:work or home office Persons participating in the virtual visit: patient, provider  I discussed the limitations of evaluation and management by telemedicine and the availability of in person appointments. The patient expressed understanding and agreed to proceed.   HPI: 1. HTN he is not checking her BP was Rx losartan 100/25 unclear if he is taking this BP 11/29/2018 was 155/82 in urgent care not sure if he had medications   2. Left elbow pain   Xray 11/29/2018  FINDINGS: No acute bony or joint abnormality is identified. The patient has advanced osteoarthritis about the joint with joint space narrowing and osteophytosis present. Osteophytosis is worse about the articulation of the ulna and trochlea. A prominent osteophyte is also seen off the posterior aspect of the distal humerus projecting toward the olecranon process. A loose body along the lateral margin of the neck of the radius measures 0.4 cm. There is no joint effusion. Mild calcification in the distal triceps tendon is noted.  IMPRESSION: No acute abnormality.  Advanced osteoarthritis about the elbow. Osteophytosis about the joint, particularly off the coronoid process of the ulna and posterior aspect of the distal humerus could limit range of motion. Small loose body along the neck of the radius is noted.  3. DM 2 A1C 6.7 05/04/18 and 06/28/18 7.2 on Ozempic  4. Glaucoma saw eye [facility] 07/2018 and per pt he is using his eye drops    ROS: See pertinent positives and negatives per HPI.  Past Medical History:  Diagnosis Date  . Diabetes mellitus without complication ([place])   . Difficult intubation   . GERD (gastroesophageal reflux disease)    OCC- TUMS  . Glaucoma   .  Hypercholesteremia   . Hypertension   . Strep throat    01/24/18     Past Surgical History:  Procedure Laterality Date  . HERNIA REPAIR    . INSERTION OF MESH N/A 10/22/2017   Procedure: INSERTION OF MESH;  Surgeon: Vickie Epley, MD;  Location: [facility] OR[facility];  Service: General;  Laterality: N/A;  . UMBILICAL HERNIA REPAIR N/A 10/22/2017   Procedure: HERNIA REPAIR UMBILICAL ADULT WITH MESH;  Surgeon: Vickie Epley, MD;  Location: [facility];  Service: General;  Laterality: N/A;    Family History  Problem Relation Age of Onset  . Diabetes Father   . Hyperlipidemia Father   . Hypertension Father   . Kidney disease Father   . Diabetes Paternal Uncle     SOCIAL HX: Glass blower/designer    Current Outpatient Medications:  .  atorvastatin (LIPITOR) 40 MG tablet, Take 1 tablet (40 mg total) by mouth daily., Disp: 90 tablet, Rfl: 3 .  diclofenac sodium (VOLTAREN) 1 % GEL, Apply 2 g topically 4 (four) times daily., Disp: 100 g, Rfl: 12 .  latanoprost (XALATAN) 0.005 % ophthalmic solution, INSTILL 1 DROP INTO EACH EYE NIGHTLY, Disp: , Rfl: 12 .  losartan-hydrochlorothiazide (HYZAAR) 100-25 MG tablet, Take 1 tablet by mouth daily., Disp: 90 tablet, Rfl: 3 .  meloxicam (MOBIC) 15 MG tablet, Take 1 tablet (15 mg total) by mouth daily as needed for pain., Disp: 30 tablet, Rfl: 1 .  Semaglutide 0.25 or 0.5 MG/DOSE SOPN, Inject 0.25 mg into the skin every 7 (seven) days., Disp: ,  Rfl:   EXAM:  VITALS per patient if applicable:  GENERAL: alert, oriented, appears well and in no acute distress  HEENT: atraumatic, conjunttiva clear, no obvious abnormalities on inspection of external nose and ears  NECK: normal movements of the head and neck  LUNGS: on inspection no signs of respiratory distress, breathing rate appears normal, no obvious gross SOB, gasping or wheezing  CV: no obvious cyanosis  MS: moves all visible extremities without noticeable abnormality  PSYCH/NEURO: pleasant and  cooperative, no obvious depression or anxiety, speech and thought processing grossly intact  ASSESSMENT AND PLAN:  Discussed the following assessment and plan:  Osteoarthritis of left elbow, unspecified osteoarthritis type see Xray 11/29/2018 - Plan: diclofenac sodium (VOLTAREN) 1 % GEL, Ambulatory referral to Orthopedic Surgery rec tylenol prn  Essential hypertension/HLD -out of BP meds #90 day supply and last picked up 06/29/2018   Type 2 diabetes mellitus with hyperglycemia, without long-term current use of insulin (HCC) -off Ozempic 0.5 since 09/2018 per pharmacy  -eye MD [facility] 07/2018 see below   Hyperlipidemia, unspecified hyperlipidemia type-on liptior 40 mg qhs   Glaucoma-not using drops per pharmacy pick up history though should be on xalatan  HM Declines flu shot Tdap per pt had at [place] and get records  Consider shingrix vaccine in future Consider pna 23 in future   MMR immune Consider hep B vaccine   Check records for PSA will need DRE in future  Never had colonoscopy disc'ed today will try to get pt to consider in the future  Former smoker 1/2 ppd quit age 24 smoked age [age]  hep C neg 09/13/17    I discussed the assessment and treatment plan with the patient. The patient was provided an opportunity to ask questions and all were answered. The patient agreed with the plan and demonstrated an understanding of the instructions.   The patient was advised to call back or seek an in-person evaluation if the symptoms worsen or if the condition fails to improve as anticipated.  Time spent 25 minutes  Delorise Jackson, MD

## 2018-12-15 ENCOUNTER — Telehealth: Payer: Self-pay

## 2018-12-15 ENCOUNTER — Other Ambulatory Visit: Payer: Self-pay

## 2018-12-15 ENCOUNTER — Other Ambulatory Visit (INDEPENDENT_AMBULATORY_CARE_PROVIDER_SITE_OTHER): Payer: Commercial Managed Care - PPO

## 2018-12-15 ENCOUNTER — Ambulatory Visit: Payer: Commercial Managed Care - PPO

## 2018-12-15 ENCOUNTER — Other Ambulatory Visit: Payer: Self-pay | Admitting: Internal Medicine

## 2018-12-15 ENCOUNTER — Other Ambulatory Visit: Payer: Commercial Managed Care - PPO

## 2018-12-15 ENCOUNTER — Encounter: Payer: Self-pay | Admitting: Internal Medicine

## 2018-12-15 VITALS — BP 150/80 | HR 75

## 2018-12-15 DIAGNOSIS — I10 Essential (primary) hypertension: Secondary | ICD-10-CM

## 2018-12-15 DIAGNOSIS — E559 Vitamin D deficiency, unspecified: Secondary | ICD-10-CM

## 2018-12-15 DIAGNOSIS — E1165 Type 2 diabetes mellitus with hyperglycemia: Secondary | ICD-10-CM

## 2018-12-15 LAB — CBC WITH DIFFERENTIAL/PLATELET
Basophils Absolute: 0 10*3/uL (ref 0.0–0.1)
Basophils Relative: 0.5 % (ref 0.0–3.0)
Eosinophils Absolute: 0.1 10*3/uL (ref 0.0–0.7)
Eosinophils Relative: 1.2 % (ref 0.0–5.0)
HCT: 48.2 % (ref 39.0–52.0)
Hemoglobin: 15.9 g/dL (ref 13.0–17.0)
Lymphocytes Relative: 25 % (ref 12.0–46.0)
Lymphs Abs: 2.4 10*3/uL (ref 0.7–4.0)
MCHC: 32.9 g/dL (ref 30.0–36.0)
MCV: 78.9 fl (ref 78.0–100.0)
Monocytes Absolute: 0.6 10*3/uL (ref 0.1–1.0)
Monocytes Relative: 5.8 % (ref 3.0–12.0)
Neutro Abs: 6.5 10*3/uL (ref 1.4–7.7)
Neutrophils Relative %: 67.5 % (ref 43.0–77.0)
Platelets: 289 10*3/uL (ref 150.0–400.0)
RBC: 6.11 Mil/uL — ABNORMAL HIGH (ref 4.22–5.81)
RDW: 16.1 % — ABNORMAL HIGH (ref 11.5–15.5)
WBC: 9.6 10*3/uL (ref 4.0–10.5)

## 2018-12-15 LAB — COMPREHENSIVE METABOLIC PANEL
ALT: 16 U/L (ref 0–53)
AST: 17 U/L (ref 0–37)
Albumin: 3.8 g/dL (ref 3.5–5.2)
Alkaline Phosphatase: 61 U/L (ref 39–117)
BUN: 15 mg/dL (ref 6–23)
CO2: 27 mEq/L (ref 19–32)
Calcium: 9.1 mg/dL (ref 8.4–10.5)
Chloride: 103 mEq/L (ref 96–112)
Creatinine, Ser: 1.13 mg/dL (ref 0.40–1.50)
GFR: 81.45 mL/min (ref >60.00)
Glucose, Bld: 132 mg/dL — ABNORMAL HIGH (ref 70–99)
Potassium: 3.7 mEq/L (ref 3.5–5.1)
Sodium: 139 mEq/L (ref 135–145)
Total Bilirubin: 0.7 mg/dL (ref 0.2–1.2)
Total Protein: 6.7 g/dL (ref 6.0–8.3)

## 2018-12-15 LAB — LIPID PANEL
Cholesterol: 205 mg/dL — ABNORMAL HIGH (ref 0–200)
HDL: 47.1 mg/dL (ref >39.00)
LDL Cholesterol: 133 mg/dL — ABNORMAL HIGH (ref 0–99)
NonHDL: 158.36
Total CHOL/HDL Ratio: 4
Triglycerides: 127 mg/dL (ref 0.0–149.0)
VLDL: 25.4 mg/dL (ref 0.0–40.0)

## 2018-12-15 LAB — HEMOGLOBIN A1C: Hgb A1c MFr Bld: 7.3 % — ABNORMAL HIGH (ref 4.6–6.5)

## 2018-12-15 LAB — VITAMIN D 25 HYDROXY (VIT D DEFICIENCY, FRACTURES): VITD: 14.46 ng/mL — ABNORMAL LOW (ref 30.00–100.00)

## 2018-12-15 MED ORDER — CHOLECALCIFEROL 1.25 MG (50000 UT) PO CAPS: 50000.0000 [IU] | ORAL_CAPSULE | ORAL | 1 refills | Status: DC

## 2018-12-15 NOTE — Telephone Encounter (Signed)
Left message for patient to return call back. [facility] may give and obtain information.  

## 2018-12-15 NOTE — Progress Notes (Signed)
Patient here for nurse visit BP check per order from [facility].   Patient reports compliance with prescribed BP medications: no  Last dose of BP medication: 9 am today.  BP Readings from Last 3 Encounters:  11/29/18 (!) 155/82  05/03/18 (!) 142/86  04/21/18 (!) 166/100   Pulse Readings from Last 3 Encounters:  11/29/18 75  05/03/18 75  04/21/18 70    Per Dr. [name] stated he needed to pick up his medication and take all prescribed and he went to front desk and scheduled a 2 week nurse visit to recheck BP.    Patient verbalized understanding of instructions.   Allean Found, CMA

## 2018-12-15 NOTE — Telephone Encounter (Signed)
-----   Message from Bevelyn Buckles, MD sent at 12/14/2018  6:03 PM EDT ----- Call pt to schedule fasting lab appt asap  AND Nurse visit for BP check

## 2018-12-15 NOTE — Telephone Encounter (Signed)
Copied from CRM [id number]. Topic: Appointment Scheduling - Scheduling Inquiry for Clinic >> Dec 14, 2018  6:56 PM Jay Schlichter wrote: Reason for CRM: [phone] Would like to schedule lab visit and bp check     Called and spoke with pt and scheduled lab and nurse visit for today.  Nina,cma

## 2018-12-15 NOTE — Telephone Encounter (Signed)
-----   Message from Bevelyn Buckles, MD sent at 12/14/2018  6:06 PM EDT ----- When you speck to pt or pts GF [place] or pt make sure he picks up meds from pharmacy every time he runs out I called and he has not picked up BP med since 06/2018 #90 day supply   Which is why his BP is uncontrolled

## 2018-12-29 ENCOUNTER — Other Ambulatory Visit: Payer: Self-pay | Admitting: Internal Medicine

## 2018-12-29 ENCOUNTER — Ambulatory Visit: Payer: Commercial Managed Care - PPO

## 2018-12-29 ENCOUNTER — Other Ambulatory Visit: Payer: Self-pay

## 2018-12-29 ENCOUNTER — Ambulatory Visit (INDEPENDENT_AMBULATORY_CARE_PROVIDER_SITE_OTHER): Payer: Commercial Managed Care - PPO

## 2018-12-29 VITALS — BP 140/80

## 2018-12-29 DIAGNOSIS — I10 Essential (primary) hypertension: Secondary | ICD-10-CM

## 2018-12-29 MED ORDER — AMLODIPINE BESYLATE 2.5 MG PO TABS: 2.5000 mg | ORAL_TABLET | Freq: Every day | ORAL | 3 refills | Status: DC

## 2018-12-29 NOTE — Progress Notes (Addendum)
Patient here for nurse visit BP check per order from Dr. Delight Stare.   Patient reports compliance with prescribed BP medications: yes  Last dose of BP medication: thi morning  BP Readings from Last 3 Encounters:  12/29/18 140/80  12/15/18 (!) 150/80  11/29/18 (!) 155/82   Pulse Readings from Last 3 Encounters:  12/15/18 75  11/29/18 75  05/03/18 75    Per Dr. :[name]   Continue Hyzaar (losartan hctz) 100-25mg  qd and add norvasc 2.5 mg daily  Dr. [employer] reviewed with patient the new medication changes   Patient verbalized understanding of instructions.   Allean Found, CMA

## 2018-12-30 DIAGNOSIS — M19022 Primary osteoarthritis, left elbow: Secondary | ICD-10-CM | POA: Insufficient documentation

## 2019-01-13 ENCOUNTER — Telehealth (INDEPENDENT_AMBULATORY_CARE_PROVIDER_SITE_OTHER): Payer: Commercial Managed Care - PPO | Admitting: Internal Medicine

## 2019-01-13 ENCOUNTER — Other Ambulatory Visit: Payer: Self-pay | Admitting: Internal Medicine

## 2019-01-13 ENCOUNTER — Telehealth: Payer: Self-pay | Admitting: *Deleted

## 2019-01-13 DIAGNOSIS — R600 Localized edema: Secondary | ICD-10-CM

## 2019-01-13 DIAGNOSIS — G501 Atypical facial pain: Secondary | ICD-10-CM | POA: Diagnosis not present

## 2019-01-13 DIAGNOSIS — K047 Periapical abscess without sinus: Secondary | ICD-10-CM

## 2019-01-13 DIAGNOSIS — K0889 Other specified disorders of teeth and supporting structures: Secondary | ICD-10-CM

## 2019-01-13 MED ORDER — OXYCODONE-ACETAMINOPHEN 5-325 MG PO TABS: 1.0000 | ORAL_TABLET | Freq: Two times a day (BID) | ORAL | 0 refills | Status: DC | PRN

## 2019-01-13 NOTE — Telephone Encounter (Signed)
Copied from CRM [id number]. Topic: General - Other >> Jan 13, 2019  3:44 PM Floria Raveling A wrote: Reason for CRM: pt mother called in and stated that pt has a bad tooth ach, he could not get into dentist office until the 9th.  She wanted to know if dr would give him anything for pain?   Pharmacy -[facility] [address] (N), [place] - [address] [phone] (Phone)  Best number -[phone]

## 2019-01-13 NOTE — Telephone Encounter (Signed)
Bad toothache 2 teeth (upper) are bad/decayed 2 holes 1 is wisdom tooth  Pain 8.5/10  Right facial pain and swelling   Oral surgeon is 01/17/2019  He has amoxicillin  Tried tylenol, oragel w/o relief   Will give temp supply percocet #10  TMS

## 2019-01-13 NOTE — Telephone Encounter (Addendum)
Bad toothache 2 teeth (upper) are bad/decayed 2 holes 1 of teeth is his wisdom tooth  Pain 8.5/10  Right facial pain and swelling   Oral surgery is 01/17/2019  He has amoxicillin  Tried tylenol, oragel w/o relief  He called dentist office today but they were closed   A/P Dental pain/infection  Will give temp supply percocet #10 further refills per dentist   Time spent 5 minutes, consent obtained to chart for telephone encounter and medication Rx which is for dental related issue   TMS

## 2019-02-23 ENCOUNTER — Other Ambulatory Visit: Payer: Self-pay

## 2019-02-23 DIAGNOSIS — Z20822 Contact with and (suspected) exposure to covid-19: Secondary | ICD-10-CM

## 2019-02-28 LAB — NOVEL CORONAVIRUS, NAA: SARS-CoV-2, NAA: NOT DETECTED

## 2019-03-01 ENCOUNTER — Ambulatory Visit (INDEPENDENT_AMBULATORY_CARE_PROVIDER_SITE_OTHER): Payer: Commercial Managed Care - PPO | Admitting: Internal Medicine

## 2019-03-01 ENCOUNTER — Encounter: Payer: Self-pay | Admitting: Internal Medicine

## 2019-03-01 ENCOUNTER — Other Ambulatory Visit: Payer: Self-pay

## 2019-03-01 ENCOUNTER — Other Ambulatory Visit: Payer: Self-pay | Admitting: Internal Medicine

## 2019-03-01 DIAGNOSIS — Z20828 Contact with and (suspected) exposure to other viral communicable diseases: Secondary | ICD-10-CM | POA: Diagnosis not present

## 2019-03-01 DIAGNOSIS — R51 Headache: Secondary | ICD-10-CM

## 2019-03-01 DIAGNOSIS — Z20822 Contact with and (suspected) exposure to covid-19: Secondary | ICD-10-CM

## 2019-03-01 DIAGNOSIS — R6883 Chills (without fever): Secondary | ICD-10-CM | POA: Diagnosis not present

## 2019-03-01 NOTE — Progress Notes (Signed)
Virtual Visit via Video Note  I connected with Alexander Powers   on 03/01/19 at 11:30 AM EDT by a video enabled telemedicine application and verified that I am speaking with the correct person using two identifiers.  Location patient: home Location provider:work o Persons participating in the virtual visit: patient, provider, pts sig other Alexander Powers  I discussed the limitations of evaluation and management by telemedicine and the availability of in person appointments. The patient expressed understanding and agreed to proceed.   HPI: 7/16 or 02/24/19 he was at a friends house who recently was diagnosed with COVID 19 and in the hospital not doing well.  02/28/19 he developed h/a, cold chills and dry cough, and diarrhea w/o fever. Nothing tried   His girlfriend Alexander Powers having similar sxs with h/a   ROS: See pertinent positives and negatives per HPI.  Past Medical History:  Diagnosis Date  . Diabetes mellitus without complication (HCC)   . Difficult intubation   . GERD (gastroesophageal reflux disease)    OCC- TUMS  . Glaucoma   . Hypercholesteremia   . Hypertension   . Strep throat    01/24/18     Past Surgical History:  Procedure Laterality Date  . HERNIA REPAIR    . INSERTION OF MESH N/A 10/22/2017   Procedure: INSERTION OF MESH;  Surgeon: Ancil Linseyavis, Jason Evan, MD;  Location: [facility];  Service: General;  Laterality: N/A;  . UMBILICAL HERNIA REPAIR N/A 10/22/2017   Procedure: HERNIA REPAIR UMBILICAL ADULT WITH MESH;  Surgeon: Ancil Linseyavis, Jason Evan, MD;  Location: [facility] OR[facility];  Service: General;  Laterality: N/A;    Family History  Problem Relation Age of Onset  . Diabetes Father   . Hyperlipidemia Father   . Hypertension Father   . Kidney disease Father   . Diabetes Paternal Uncle     SOCIAL HX:  GED, Location managerMachine operator  No guns  Wears seat belt  Safe in relationship engaged to Alexander Powers  Works [employer] [place]    Current Outpatient Medications:  .  amLODipine (NORVASC)  2.5 MG tablet, Take 1 tablet (2.5 mg total) by mouth daily., Disp: 90 tablet, Rfl: 3 .  atorvastatin (LIPITOR) 40 MG tablet, Take 1 tablet (40 mg total) by mouth daily., Disp: 90 tablet, Rfl: 3 .  Cholecalciferol 1.25 MG (50000 UT) capsule, Take 1 capsule (50,000 Units total) by mouth once a week., Disp: 13 capsule, Rfl: 1 .  diclofenac sodium (VOLTAREN) 1 % GEL, Apply 2 g topically 4 (four) times daily., Disp: 100 g, Rfl: 12 .  latanoprost (XALATAN) 0.005 % ophthalmic solution, INSTILL 1 DROP INTO EACH EYE NIGHTLY, Disp: , Rfl: 12 .  losartan-hydrochlorothiazide (HYZAAR) 100-25 MG tablet, Take 1 tablet by mouth daily., Disp: 90 tablet, Rfl: 3 .  meloxicam (MOBIC) 15 MG tablet, Take 1 tablet (15 mg total) by mouth daily as needed for pain., Disp: 30 tablet, Rfl: 1 .  oxyCODONE-acetaminophen (PERCOCET) 5-325 MG tablet, Take 1 tablet by mouth 2 (two) times daily as needed for severe pain., Disp: 10 tablet, Rfl: 0 .  Semaglutide,0.25 or 0.5MG /DOS, 2 MG/1.5ML SOPN, Inject 0.5 mg into the skin every 7 (seven) days., Disp: 5 pen, Rfl: 12  EXAM:  VITALS per patient if applicable:  GENERAL: alert, oriented, appears well and in no acute distress  HEENT: atraumatic, conjunttiva clear, no obvious abnormalities on inspection of external nose and ears  NECK: normal movements of the head and neck  LUNGS: on inspection no signs of respiratory distress,  breathing rate appears normal, no obvious gross SOB, gasping or wheezing  CV: no obvious cyanosis  MS: moves all visible extremities without noticeable abnormality  PSYCH/NEURO: pleasant and cooperative, no obvious depression or anxiety, speech and thought processing grossly intact  ASSESSMENT AND PLAN:  Discussed the following assessment and plan:  Exposure to Covid-19 Virus - Plan: Novel Coronavirus, NAA ([facility])  rec supportive care mucinex dm green label or robitussin dm, prn Tylenol for now if worse go to ED  If pt considering out of work  COVID 19 due to HTN/DM 2 he needs to speak with [name] at work  Note for work      I discussed the assessment and treatment plan with the patient. The patient was provided an opportunity to ask questions and all were answered. The patient agreed with the plan and demonstrated an understanding of the instructions.   The patient was advised to call back or seek an in-person evaluation if the symptoms worsen or if the condition fails to improve as anticipated.  Time spent 15 minutes  Delorise Jackson, MD

## 2019-03-05 LAB — NOVEL CORONAVIRUS, NAA: SARS-CoV-2, NAA: NOT DETECTED

## 2019-03-21 ENCOUNTER — Telehealth: Payer: Self-pay

## 2019-03-21 NOTE — Telephone Encounter (Signed)
Copied from [place] [id number]. Topic: Appointment Scheduling - Scheduling Inquiry for Clinic >> Mar 21, 2019  2:21 PM Oneta Rack wrote: Patient seeking an appointment due to the following symptoms chills and diarrhea. Patient was exposed to someone who was dx with COVID, attempted to reach the practice and was unsuccessful best contact # [phone]

## 2019-03-21 NOTE — Telephone Encounter (Signed)
Called pt to schedule an appt.  No answer.  LMTCB.

## 2019-03-22 ENCOUNTER — Encounter: Payer: Self-pay | Admitting: Internal Medicine

## 2019-03-22 ENCOUNTER — Other Ambulatory Visit: Payer: Self-pay

## 2019-03-22 ENCOUNTER — Ambulatory Visit (INDEPENDENT_AMBULATORY_CARE_PROVIDER_SITE_OTHER): Payer: Commercial Managed Care - PPO | Admitting: Internal Medicine

## 2019-03-22 DIAGNOSIS — R05 Cough: Secondary | ICD-10-CM

## 2019-03-22 DIAGNOSIS — Z20828 Contact with and (suspected) exposure to other viral communicable diseases: Secondary | ICD-10-CM | POA: Diagnosis not present

## 2019-03-22 DIAGNOSIS — J309 Allergic rhinitis, unspecified: Secondary | ICD-10-CM | POA: Diagnosis not present

## 2019-03-22 DIAGNOSIS — R52 Pain, unspecified: Secondary | ICD-10-CM

## 2019-03-22 DIAGNOSIS — R197 Diarrhea, unspecified: Secondary | ICD-10-CM

## 2019-03-22 DIAGNOSIS — R059 Cough, unspecified: Secondary | ICD-10-CM

## 2019-03-22 DIAGNOSIS — Z20822 Contact with and (suspected) exposure to covid-19: Secondary | ICD-10-CM

## 2019-03-22 MED ORDER — ACETAMINOPHEN 500 MG PO TABS: 500.0000 mg | ORAL_TABLET | ORAL | 3 refills | Status: AC | PRN

## 2019-03-22 MED ORDER — ALBUTEROL SULFATE HFA 108 (90 BASE) MCG/ACT IN AERS: 2.0000 | INHALATION_SPRAY | RESPIRATORY_TRACT | 2 refills | Status: DC | PRN

## 2019-03-22 MED ORDER — PEPTO-BISMOL 524 MG/30ML PO SUSP: 30.0000 mL | Freq: Three times a day (TID) | ORAL | 0 refills | Status: DC

## 2019-03-22 MED ORDER — MUCINEX DM MAXIMUM STRENGTH 60-1200 MG PO TB12: 1.0000 | ORAL_TABLET | Freq: Two times a day (BID) | ORAL | 0 refills | Status: DC | PRN

## 2019-03-22 MED ORDER — CETIRIZINE HCL 10 MG PO TABS: 10.0000 mg | ORAL_TABLET | Freq: Every day | ORAL | 3 refills | Status: DC | PRN

## 2019-03-22 NOTE — Telephone Encounter (Signed)
mychart sent to patient due to not being able to reach patient by phpone.

## 2019-03-22 NOTE — Patient Instructions (Signed)
Bland Diet A bland diet consists of foods that are often soft and do not have a lot of fat, fiber, or extra seasonings. Foods without fat, fiber, or seasoning are easier for the body to digest. They are also less likely to irritate your mouth, throat, stomach, and other parts of your digestive system. A bland diet is sometimes called a BRAT diet. What is my plan? Your health care provider or food and nutrition specialist (dietitian) may recommend specific changes to your diet to prevent symptoms or to treat your symptoms. These changes may include:  Eating small meals often.  Cooking food until it is soft enough to chew easily.  Chewing your food well.  Drinking fluids slowly.  Not eating foods that are very spicy, sour, or fatty.  Not eating citrus fruits, such as oranges and grapefruit. What do I need to know about this diet?  Eat a variety of foods from the bland diet food list.  Do not follow a bland diet longer than needed.  Ask your health care provider whether you should take vitamins or supplements. What foods can I eat? Grains  Hot cereals, such as cream of wheat. Rice. Bread, crackers, or tortillas made from refined white flour. Vegetables Canned or cooked vegetables. Mashed or boiled potatoes. Fruits  Bananas. Applesauce. Other types of cooked or canned fruit with the skin and seeds removed, such as canned peaches or pears. Meats and other proteins  Scrambled eggs. Creamy peanut butter or other nut butters. Lean, well-cooked meats, such as chicken or fish. Tofu. Soups or broths. Dairy Low-fat dairy products, such as milk, cottage cheese, or yogurt. Beverages  Water. Herbal tea. Apple juice. Fats and oils Mild salad dressings. Canola or olive oil. Sweets and desserts Pudding. Custard. Fruit gelatin. Ice cream. The items listed above may not be a complete list of recommended foods and beverages. Contact a dietitian for more options. What foods are not  recommended? Grains Whole grain breads and cereals. Vegetables Raw vegetables. Fruits Raw fruits, especially citrus, berries, or dried fruits. Dairy Whole fat dairy foods. Beverages Caffeinated drinks. Alcohol. Seasonings and condiments Strongly flavored seasonings or condiments. Hot sauce. Salsa. Other foods Spicy foods. Fried foods. Sour foods, such as pickled or fermented foods. Foods with high sugar content. Foods high in fiber. The items listed above may not be a complete list of foods and beverages to avoid. Contact a dietitian for more information. Summary  A bland diet consists of foods that are often soft and do not have a lot of fat, fiber, or extra seasonings.  Foods without fat, fiber, or seasoning are easier for the body to digest.  Check with your health care provider to see how long you should follow this diet plan. It is not meant to be followed for long periods. This information is not intended to replace advice given to you by your health care provider. Make sure you discuss any questions you have with your health care provider. Document Released: 11/18/2015 Document Revised: 08/25/2017 Document Reviewed: 08/25/2017 Elsevier Patient Education  2020 Elsevier Inc.  Food Choices to Help Relieve Diarrhea, Adult When you have diarrhea, the foods you eat and your eating habits are very important. Choosing the right foods and drinks can help:  Relieve diarrhea.  Replace lost fluids and nutrients.  Prevent dehydration. What general guidelines should I follow?  Relieving diarrhea  Choose foods with less than 2 g or .07 oz. of fiber per serving.  Limit fats to less than 8   tsp (38 g or 1.34 oz.) a day.  Avoid the following: ? Foods and beverages sweetened with high-fructose corn syrup, honey, or sugar alcohols such as xylitol, sorbitol, and mannitol. ? Foods that contain a lot of fat or sugar. ? Fried, greasy, or spicy foods. ? High-fiber grains, breads, and  cereals. ? Raw fruits and vegetables.  Eat foods that are rich in probiotics. These foods include dairy products such as yogurt and fermented milk products. They help increase healthy bacteria in the stomach and intestines (gastrointestinal tract, or GI tract).  If you have lactose intolerance, avoid dairy products. These may make your diarrhea worse.  Take medicine to help stop diarrhea (antidiarrheal medicine) only as told by your health care provider. Replacing nutrients  Eat small meals or snacks every 3-4 hours.  Eat bland foods, such as white rice, toast, or baked potato, until your diarrhea starts to get better. Gradually reintroduce nutrient-rich foods as tolerated or as told by your health care provider. This includes: ? Well-cooked protein foods. ? Peeled, seeded, and soft-cooked fruits and vegetables. ? Low-fat dairy products.  Take vitamin and mineral supplements as told by your health care provider. Preventing dehydration  Start by sipping water or a special solution to prevent dehydration (oral rehydration solution, ORS). Urine that is clear or pale yellow means that you are getting enough fluid.  Try to drink at least 8-10 cups of fluid each day to help replace lost fluids.  You may add other liquids in addition to water, such as clear juice or decaffeinated sports drinks, as tolerated or as told by your health care provider.  Avoid drinks with caffeine, such as coffee, tea, or soft drinks.  Avoid alcohol. What foods are recommended?     The items listed may not be a complete list. Talk with your health care provider about what dietary choices are best for you. Grains White rice. White, French, or pita breads (fresh or toasted), including plain rolls, buns, or bagels. White pasta. Saltine, soda, or graham crackers. Pretzels. Low-fiber cereal. Cooked cereals made with water (such as cornmeal, farina, or cream cereals). Plain muffins. Matzo. Melba toast. Zwieback.  Vegetables Potatoes (without the skin). Most well-cooked and canned vegetables without skins or seeds. Tender lettuce. Fruits Apple sauce. Fruits canned in juice. Cooked apricots, cherries, grapefruit, peaches, pears, or plums. Fresh bananas and cantaloupe. Meats and other protein foods Baked or boiled chicken. Eggs. Tofu. Fish. Seafood. Smooth nut butters. Ground or well-cooked tender beef, ham, veal, lamb, pork, or poultry. Dairy Plain yogurt, kefir, and unsweetened liquid yogurt. Lactose-free milk, buttermilk, skim milk, or soy milk. Low-fat or nonfat hard cheese. Beverages Water. Low-calorie sports drinks. Fruit juices without pulp. Strained tomato and vegetable juices. Decaffeinated teas. Sugar-free beverages not sweetened with sugar alcohols. Oral rehydration solutions, if approved by your health care provider. Seasoning and other foods Bouillon, broth, or soups made from recommended foods. What foods are not recommended? The items listed may not be a complete list. Talk with your health care provider about what dietary choices are best for you. Grains Whole grain, whole wheat, bran, or rye breads, rolls, pastas, and crackers. Wild or brown rice. Whole grain or bran cereals. Barley. Oats and oatmeal. Corn tortillas or taco shells. Granola. Popcorn. Vegetables Raw vegetables. Fried vegetables. Cabbage, broccoli, Brussels sprouts, artichokes, baked beans, beet greens, corn, kale, legumes, peas, sweet potatoes, and yams. Potato skins. Cooked spinach and cabbage. Fruits Dried fruit, including raisins and dates. Raw fruits. Stewed or dried prunes.   Canned fruits with syrup. Meat and other protein foods Fried or fatty meats. Deli meats. Chunky nut butters. Nuts and seeds. Beans and lentils. Bacon. Hot dogs. Sausage. Dairy High-fat cheeses. Whole milk, chocolate milk, and beverages made with milk, such as milk shakes. Half-and-half. Cream. sour cream. Ice cream. Beverages Caffeinated  beverages (such as coffee, tea, soda, or energy drinks). Alcoholic beverages. Fruit juices with pulp. Prune juice. Soft drinks sweetened with high-fructose corn syrup or sugar alcohols. High-calorie sports drinks. Fats and oils Butter. Cream sauces. Margarine. Salad oils. Plain salad dressings. Olives. Avocados. Mayonnaise. Sweets and desserts Sweet rolls, doughnuts, and sweet breads. Sugar-free desserts sweetened with sugar alcohols such as xylitol and sorbitol. Seasoning and other foods Honey. Hot sauce. Chili powder. Gravy. Cream-based or milk-based soups. Pancakes and waffles. Summary  When you have diarrhea, the foods you eat and your eating habits are very important.  Make sure you get at least 8-10 cups of fluid each day, or enough to keep your urine clear or pale yellow.  Eat bland foods and gradually reintroduce healthy, nutrient-rich foods as tolerated, or as told by your health care provider.  Avoid high-fiber, fried, greasy, or spicy foods. This information is not intended to replace advice given to you by your health care provider. Make sure you discuss any questions you have with your health care provider. Document Released: 10/17/2003 Document Revised: 11/17/2018 Document Reviewed: 07/24/2016 Elsevier Patient Education  2020 Elsevier Inc.  

## 2019-03-22 NOTE — Progress Notes (Signed)
Telephone Note  I connected with Alexander Powers   on 03/22/19 at 10:10 AM EDT by a telephone and verified that I am speaking with the correct person using two identifiers.  Location patient: home Location provider:work or home office Persons participating in the virtual visit: patient, provider, pts partner Alexander Powers  I discussed the limitations of evaluation and management by telemedicine and the availability of in person appointments. The patient expressed understanding and agreed to proceed.   HPI:  Sick since 03/19/2019 c/o dry cough multiple times worse at night, sneezing, watery eyes, body aches, chills, diarrha x 2-3 x per day runny brown nothing tried. No fever checked 99.6. 03/05/19 his partner Alexander Powers was dx'ed with COVID 19 He called out of work today   ROS: See pertinent positives and negatives per HPI.  Past Medical History:  Diagnosis Date  . Diabetes mellitus without complication (HCC)   . Difficult intubation   . GERD (gastroesophageal reflux disease)    OCC- TUMS  . Glaucoma   . Hypercholesteremia   . Hypertension   . Strep throat    01/24/18     Past Surgical History:  Procedure Laterality Date  . HERNIA REPAIR    . INSERTION OF MESH N/A 10/22/2017   Procedure: INSERTION OF MESH;  Surgeon: Ancil Linseyavis, Jason Evan, MD;  Location: [facility] ORS;  Service: General;  Laterality: N/A;  . UMBILICAL HERNIA REPAIR N/A 10/22/2017   Procedure: HERNIA REPAIR UMBILICAL ADULT WITH MESH;  Surgeon: Ancil Linseyavis, Jason Evan, MD;  Location: [facility] ORS;  Service: General;  Laterality: N/A;    Family History  Problem Relation Age of Onset  . Diabetes Father   . Hyperlipidemia Father   . Hypertension Father   . Kidney disease Father   . Diabetes Paternal Uncle     SOCIAL HX:  GED, Location managerMachine operator  No guns  Wears seat belt  Safe in relationship engaged to [name]  [employer] [place] Lives with girlfriend Alexander Powers  Current Outpatient Medications:  .  amLODipine (NORVASC) 2.5 MG  tablet, Take 1 tablet (2.5 mg total) by mouth daily., Disp: 90 tablet, Rfl: 3 .  atorvastatin (LIPITOR) 40 MG tablet, Take 1 tablet (40 mg total) by mouth daily., Disp: 90 tablet, Rfl: 3 .  Cholecalciferol 1.25 MG (50000 UT) capsule, Take 1 capsule (50,000 Units total) by mouth once a week., Disp: 13 capsule, Rfl: 1 .  diclofenac sodium (VOLTAREN) 1 % GEL, Apply 2 g topically 4 (four) times daily., Disp: 100 g, Rfl: 12 .  latanoprost (XALATAN) 0.005 % ophthalmic solution, INSTILL 1 DROP INTO EACH EYE NIGHTLY, Disp: , Rfl: 12 .  losartan-hydrochlorothiazide (HYZAAR) 100-25 MG tablet, Take 1 tablet by mouth daily., Disp: 90 tablet, Rfl: 3 .  meloxicam (MOBIC) 15 MG tablet, Take 1 tablet (15 mg total) by mouth daily as needed for pain., Disp: 30 tablet, Rfl: 1 .  oxyCODONE-acetaminophen (PERCOCET) 5-325 MG tablet, Take 1 tablet by mouth 2 (two) times daily as needed for severe pain., Disp: 10 tablet, Rfl: 0 .  Semaglutide,0.25 or 0.5MG /DOS, 2 MG/1.5ML SOPN, Inject 0.5 mg into the skin every 7 (seven) days., Disp: 5 pen, Rfl: 12 .  acetaminophen (TYLENOL) 500 MG tablet, Take 1 tablet (500 mg total) by mouth every 4 (four) hours as needed., Disp: 90 tablet, Rfl: 3 .  albuterol (VENTOLIN HFA) 108 (90 Base) MCG/ACT inhaler, Inhale 2 puffs into the lungs every 4 (four) hours as needed for wheezing or shortness of breath., Disp: 18 g,  Rfl: 2 .  bismuth subsalicylate (PEPTO-BISMOL) 262 MG/15ML suspension, Take 30 mLs by mouth 4 (four) times daily -  before meals and at bedtime., Disp: 473 mL, Rfl: 0 .  cetirizine (ZYRTEC) 10 MG tablet, Take 1 tablet (10 mg total) by mouth daily as needed for allergies., Disp: 90 tablet, Rfl: 3 .  Dextromethorphan-guaiFENesin (MUCINEX DM MAXIMUM STRENGTH) 60-1200 MG TB12, Take 1 tablet by mouth 2 (two) times daily as needed., Disp: 40 tablet, Rfl: 0  EXAM:  VITALS per patient if applicable:  GENERAL: alert, oriented, appears well and in no acute distress  PSYCH/NEURO:  pleasant and cooperative, no obvious depression or anxiety, speech and thought processing grossly intact  ASSESSMENT AND PLAN:  Discussed the following assessment and plan:  Cough - Plan: albuterol (VENTOLIN HFA) 108 (90 Base) MCG/ACT inhaler, Dextromethorphan-guaiFENesin (MUCINEX DM MAXIMUM STRENGTH) 60-1200 MG TB12, Novel Coronavirus, NAA ([facility]) retest  Note for work to be out of work x 2 weeks  If worsening go to ED  Close Exposure to [employer] Virus - Plan: albuterol (VENTOLIN HFA) 108 (90 Base) MCG/ACT inhaler, Novel Coronavirus, NAA ([facility])  Body aches - Plan: acetaminophen (TYLENOL) 500 MG tablet, Novel Coronavirus, NAA ([facility])  Allergic rhinitis, unspecified seasonality, unspecified trigger - Plan: cetirizine (ZYRTEC) 10 MG tablet  Diarrhea, unspecified type - Plan: bismuth subsalicylate (PEPTO-BISMOL) 262 MG/15ML suspension, Novel Coronavirus, NAA ([facility]) rec oral hydration and BRAT diet      I discussed the assessment and treatment plan with the patient. The patient was provided an opportunity to ask questions and all were answered. The patient agreed with the plan and demonstrated an understanding of the instructions.   The patient was advised to call back or seek an in-person evaluation if the symptoms worsen or if the condition fails to improve as anticipated.  Time spent 15 minutes Delorise Jackson, MD

## 2019-03-29 ENCOUNTER — Telehealth: Payer: Self-pay | Admitting: *Deleted

## 2019-03-29 NOTE — Telephone Encounter (Signed)
Copied from [place]. Topic: General - Other >> Mar 29, 2019 10:15 AM Rainey [place] A wrote: [place] would like a callback from Dr. Dannial Monarch nurse in regards to his letter for work that was suppose to be sent out last week that employer stated they did not receive. Patient would like provider or nurse to contact employer [name] at [phone].

## 2019-03-29 NOTE — Telephone Encounter (Signed)
Please fax letter which was typed last week and supposed to be faxed   Thanks tMS

## 2019-03-30 NOTE — Telephone Encounter (Signed)
Spoken to [employer] and informed her when we first faxed over letter. Also refaxed and sent it electronically to her fax

## 2019-03-31 ENCOUNTER — Other Ambulatory Visit: Payer: Self-pay

## 2019-03-31 DIAGNOSIS — Z20822 Contact with and (suspected) exposure to covid-19: Secondary | ICD-10-CM

## 2019-04-01 LAB — NOVEL CORONAVIRUS, NAA: SARS-CoV-2, NAA: NOT DETECTED

## 2019-04-04 ENCOUNTER — Telehealth: Payer: Self-pay

## 2019-04-04 ENCOUNTER — Ambulatory Visit: Payer: Self-pay | Admitting: *Deleted

## 2019-04-04 NOTE — Telephone Encounter (Signed)
Re fax not from out of work 03/22/19 it should have been fax at this time  If he is still having diarrhea, cold chills and cough and not getting better he needs to go to ED I.e [facility] or urgent care for evaluation   Inform pt to do this   [place]

## 2019-04-04 NOTE — Telephone Encounter (Signed)
CAlled pt, was not able to speak at moment "In bathroom."  Told pt I would call him back. CB pt, stated he was on line with another nurse.

## 2019-04-04 NOTE — Telephone Encounter (Signed)
Returned patient's call.  Consulted w/ Dr. Aundra Dubin.  Advised pt per Dr. Aundra Dubin to go to [facility] if his symptoms has worsened.  Patient said that his job still has not received his work note.  Patient needs work note faxed to:  [phone]  Patient also expressed concerns about returning to work and being exposed to [place] since he is a diabetic.  Patient requesting a note about being high risk.  Patient stated that he may apply for disability.

## 2019-04-04 NOTE — Telephone Encounter (Signed)
Copied from [place] [id number]. Topic: General - Other >> Apr 04, 2019  4:06 PM Leward Quan A wrote: Reason for CRM: Patient called to say that he is still having diarrhea, cold chills and cough is getting worst. Also his job called and want him to report to work on 04/05/2019 and he is not sure what to do. Patient would like a call back from Dr Olivia Mackie about the on going symptoms Ph#  [phone]

## 2019-04-04 NOTE — Telephone Encounter (Signed)
Re fax not from out of work 03/22/19 it should have been fax at this time  If he is still having diarrhea, cold chills and cough and not getting better he needs to go to ED I[facility]e [facility] or urgent care for evaluation   -of note ozempic can cause diarrhea 9-10 % hold the dose for now and call back in 1-2 weeks to see if it is better  Inform pt to do this   [place]

## 2019-04-05 NOTE — Telephone Encounter (Signed)
LMTCB

## 2019-04-05 NOTE — Telephone Encounter (Signed)
Letter refaxed 

## 2019-04-25 ENCOUNTER — Ambulatory Visit: Payer: Self-pay

## 2019-04-25 NOTE — Telephone Encounter (Signed)
Patient and spouse called to report that patient has had diarrhea off and on for several months. He now has had bleeding with the diarrhea. He state he had several days of blood that was bright red to dark red. Moderate amount without clots. He states that the bleeding had stopped for 2 days.  He reports chills but no fever. He denies dizziness. He states that he has been tested for COVID-19 several times and they have all been negative. He has cough that lingers. Care advice read to patient. Call placed to office. Per [facility] patient should go to ER.for evaluation of his symptoms. Virtual visit would not be appropriate with his symptoms. Patient was informed and agrees to go to ER.  Reason for Disposition . MODERATE rectal bleeding (small blood clots, passing blood without stool, or toilet water turns red)  Answer Assessment - Initial Assessment Questions 1. APPEARANCE of BLOOD: "What color is it?" "Is it passed separately, on the surface of the stool, or mixed in with the stool?"      Bright to burgandy surface 2. AMOUNT: "How much blood was passed?"      moderate 3. FREQUENCY: "How many times has blood been passed with the stools?"     3-4 4. ONSET: "When was the blood first seen in the stools?" (Days or weeks)     Last week and 2 weeks ago for 2 days now gone 2-3 days with normal 5. DIARRHEA: "Is there also some diarrhea?" If so, ask: "How many diarrhea stools were passed in past 24 hours?"      One day,  6. CONSTIPATION: "Do you have constipation?" If so, "How bad is it?"     maybe 7. RECURRENT SYMPTOMS: "Have you had blood in your stools before?" If so, ask: "When was the last time?" and "What happened that time?"     no 8. BLOOD THINNERS: "Do you take any blood thinners?" (e.g., Coumadin/warfarin, Pradaxa/dabigatran, aspirin)    no 9. OTHER SYMPTOMS: "Do you have any other symptoms?"  (e.g., abdominal pain, vomiting, dizziness, fever)    Dry heaves  10. PREGNANCY: "Is there any chance  you are pregnant?" "When was your last menstrual period?"      N/A  Protocols used: RECTAL BLEEDING-A-AH

## 2019-04-25 NOTE — Telephone Encounter (Signed)
Its best noted  [place]

## 2019-07-04 ENCOUNTER — Ambulatory Visit (INDEPENDENT_AMBULATORY_CARE_PROVIDER_SITE_OTHER): Payer: HRSA Program | Admitting: Internal Medicine

## 2019-07-04 ENCOUNTER — Encounter: Payer: Self-pay | Admitting: Internal Medicine

## 2019-07-04 ENCOUNTER — Other Ambulatory Visit: Payer: Self-pay

## 2019-07-04 VITALS — Ht 68.0 in | Wt 189.0 lb

## 2019-07-04 DIAGNOSIS — R059 Cough, unspecified: Secondary | ICD-10-CM

## 2019-07-04 DIAGNOSIS — Z20828 Contact with and (suspected) exposure to other viral communicable diseases: Secondary | ICD-10-CM | POA: Diagnosis not present

## 2019-07-04 DIAGNOSIS — E119 Type 2 diabetes mellitus without complications: Secondary | ICD-10-CM

## 2019-07-04 DIAGNOSIS — R05 Cough: Secondary | ICD-10-CM

## 2019-07-04 DIAGNOSIS — Z20822 Contact with and (suspected) exposure to covid-19: Secondary | ICD-10-CM

## 2019-07-04 DIAGNOSIS — J309 Allergic rhinitis, unspecified: Secondary | ICD-10-CM

## 2019-07-04 DIAGNOSIS — E1165 Type 2 diabetes mellitus with hyperglycemia: Secondary | ICD-10-CM | POA: Diagnosis not present

## 2019-07-04 DIAGNOSIS — J40 Bronchitis, not specified as acute or chronic: Secondary | ICD-10-CM | POA: Diagnosis not present

## 2019-07-04 DIAGNOSIS — Z0100 Encounter for examination of eyes and vision without abnormal findings: Secondary | ICD-10-CM

## 2019-07-04 DIAGNOSIS — I10 Essential (primary) hypertension: Secondary | ICD-10-CM

## 2019-07-04 DIAGNOSIS — E785 Hyperlipidemia, unspecified: Secondary | ICD-10-CM

## 2019-07-04 DIAGNOSIS — H409 Unspecified glaucoma: Secondary | ICD-10-CM

## 2019-07-04 MED ORDER — CETIRIZINE HCL 10 MG PO TABS: 10.0000 mg | ORAL_TABLET | Freq: Every day | ORAL | 3 refills | Status: DC | PRN

## 2019-07-04 MED ORDER — SEMAGLUTIDE(0.25 OR 0.5MG/DOS) 2 MG/1.5ML ~~LOC~~ SOPN: 0.5000 mg | PEN_INJECTOR | SUBCUTANEOUS | 12 refills | Status: DC

## 2019-07-04 MED ORDER — AZITHROMYCIN 250 MG PO TABS: ORAL_TABLET | ORAL | 0 refills | Status: DC

## 2019-07-04 MED ORDER — AMLODIPINE BESYLATE 2.5 MG PO TABS: 2.5000 mg | ORAL_TABLET | Freq: Every day | ORAL | 3 refills | Status: DC

## 2019-07-04 MED ORDER — MUCINEX DM MAXIMUM STRENGTH 60-1200 MG PO TB12: 1.0000 | ORAL_TABLET | Freq: Two times a day (BID) | ORAL | 0 refills | Status: DC | PRN

## 2019-07-04 MED ORDER — ALBUTEROL SULFATE HFA 108 (90 BASE) MCG/ACT IN AERS: 1.0000 | INHALATION_SPRAY | RESPIRATORY_TRACT | 2 refills | Status: DC | PRN

## 2019-07-04 MED ORDER — ATORVASTATIN CALCIUM 40 MG PO TABS: 40.0000 mg | ORAL_TABLET | Freq: Every day | ORAL | 3 refills | Status: DC

## 2019-07-04 MED ORDER — LOSARTAN POTASSIUM-HCTZ 100-25 MG PO TABS: 1.0000 | ORAL_TABLET | Freq: Every day | ORAL | 3 refills | Status: DC

## 2019-07-04 NOTE — Patient Instructions (Addendum)
01/21/2019 Office Visit [facility]  [address]  [address] [place], [place] [address]  [phone]  Sudie Grumbling, MD  [address]  [id number]; [place], [place] [address]  [phone]  [phone] (Fax)    07/23/2019   Mucinex DM green label  Or sugar free robitussin

## 2019-07-04 NOTE — Progress Notes (Signed)
telephone Note  I connected with Alexander Powers   on 07/04/19 at  3:30 PM EST by a telephone and verified that I am speaking with the correct person using two identifiers.  Location patient: home Location provider:work or home office Persons participating in the virtual visit: patient, provider  I discussed the limitations of evaluation and management by telemedicine and the availability of in person appointments. The patient expressed understanding and agreed to proceed.   HPI: 1. covid exposure again at a friends house 8-9 days ago and friend tested + and in the hospital. This is 2nd friend he has been around with covid and he is not practicing avoiding social events. His friend was dx'ed last week Monday/tuesday and he had been around his friend. C/o cough and runny nose and reduced taste though he reports food tastes too salty. He has h/a but reports he has h/a a lot. Denies sob, chest pain, fever, fatigue. He c/o cough but is smoking cigarettes 6-7 daily   2. Blurry vision with h/o glaucoma [facility]/31/2019 will refer back   3. DM 2 on ozempic 0.5 mg weekly finances a barrier as he lost job and not working since 04/2019   4. HTN on hyzaar 100-25 mg qd, norvasc 2.5 mg qd ? Compliance due to finances    ROS: See pertinent positives and negatives per HPI.  Past Medical History:  Diagnosis Date  . Diabetes mellitus without complication (HCC)   . Difficult intubation   . GERD (gastroesophageal reflux disease)    OCC- TUMS  . Glaucoma   . Hypercholesteremia   . Hypertension   . Strep throat    01/24/18     Past Surgical History:  Procedure Laterality Date  . HERNIA REPAIR    . INSERTION OF MESH N/A 10/22/2017   Procedure: INSERTION OF MESH;  Surgeon: Ancil Linseyavis, Jason Evan, MD;  Location: [facility];  Service: General;  Laterality: N/A;  . UMBILICAL HERNIA REPAIR N/A 10/22/2017   Procedure: HERNIA REPAIR UMBILICAL ADULT WITH MESH;  Surgeon: Ancil Linseyavis, Jason Evan, MD;   Location: [facility];  Service: General;  Laterality: N/A;    Family History  Problem Relation Age of Onset  . Diabetes Father   . Hyperlipidemia Father   . Hypertension Father   . Kidney disease Father   . Diabetes Paternal Uncle     SOCIAL HX:  GED, Location managerMachine operator  No guns  Wears seat belt  Safe in relationship engaged to [name]  Worked [employer] [place] [employer] until 04/2019 but now getting unemployment Lives with girlfriend [place]ita    Current Outpatient Medications:  .  acetaminophen (TYLENOL) 500 MG tablet, Take 1 tablet (500 mg total) by mouth every 4 (four) hours as needed., Disp: 90 tablet, Rfl: 3 .  albuterol (VENTOLIN HFA) 108 (90 Base) MCG/ACT inhaler, Inhale 1-2 puffs into the lungs every 4 (four) hours as needed for wheezing or shortness of breath., Disp: 18 g, Rfl: 2 .  amLODipine (NORVASC) 2.5 MG tablet, Take 1 tablet (2.5 mg total) by mouth daily., Disp: 90 tablet, Rfl: 3 .  atorvastatin (LIPITOR) 40 MG tablet, Take 1 tablet (40 mg total) by mouth daily at 6 PM., Disp: 90 tablet, Rfl: 3 .  bismuth subsalicylate (PEPTO-BISMOL) 262 MG/15ML suspension, Take 30 mLs by mouth 4 (four) times daily -  before meals and at bedtime., Disp: 473 mL, Rfl: 0 .  cetirizine (ZYRTEC) 10 MG tablet, Take 1 tablet (10 mg total)  by mouth daily as needed for allergies., Disp: 90 tablet, Rfl: 3 .  Cholecalciferol 1.25 MG (50000 UT) capsule, Take 1 capsule (50,000 Units total) by mouth once a week., Disp: 13 capsule, Rfl: 1 .  Dextromethorphan-guaiFENesin (MUCINEX DM MAXIMUM STRENGTH) 60-1200 MG TB12, Take 1 tablet by mouth 2 (two) times daily as needed., Disp: 40 tablet, Rfl: 0 .  diclofenac sodium (VOLTAREN) 1 % GEL, Apply 2 g topically 4 (four) times daily., Disp: 100 g, Rfl: 12 .  latanoprost (XALATAN) 0.005 % ophthalmic solution, INSTILL 1 DROP INTO EACH EYE NIGHTLY, Disp: , Rfl: 12 .  losartan-hydrochlorothiazide (HYZAAR) 100-25 MG tablet, Take 1 tablet by mouth daily., Disp: 90  tablet, Rfl: 3 .  meloxicam (MOBIC) 15 MG tablet, Take 1 tablet (15 mg total) by mouth daily as needed for pain., Disp: 30 tablet, Rfl: 1 .  Semaglutide,0.25 or 0.5MG /DOS, 2 MG/1.5ML SOPN, Inject 0.5 mg into the skin every 7 (seven) days., Disp: 5 pen, Rfl: 12 .  azithromycin (ZITHROMAX) 250 MG tablet, 2 pills day 1 and 1 pill day 2-5 with food, Disp: 6 tablet, Rfl: 0  EXAM:  VITALS per patient if applicable:  GENERAL: alert, oriented, appears well and in no acute distress  HEENT: atraumatic, conjunttiva clear, no obvious abnormalities on inspection of external nose and ears  LUNGS: on inspection no signs of respiratory distress, breathing rate appears normal, no obvious gross SOB, gasping or wheezing Occasional dry cough on the phone able to speak in full sentences   PSYCH/NEURO: pleasant and cooperative, no obvious depression or anxiety, speech and thought processing grossly intact  ASSESSMENT AND PLAN:  Discussed the following assessment and plan:  Exposure to COVID-19 virus - Plan: Novel Coronavirus, NAA ([facility]) pt will go 07/05/19 to get testing again prior tests negative   Cough - Plan: Dextromethorphan-guaiFENesin (MUCINEX DM MAXIMUM STRENGTH) 60-1200 MG TB12, azithromycin (ZITHROMAX) 250 MG tablet, albuterol (VENTOLIN HFA) 108 (90 Base) MCG/ACT inhaler rec smoking cessation   Bronchitis  - Plan: Dextromethorphan-guaiFENesin (MUCINEX DM MAXIMUM STRENGTH) 60-1200 MG TB12, azithromycin (ZITHROMAX) 250 MG tablet, albuterol (VENTOLIN HFA) 108 (90 Base) MCG/ACT inhaler  Type 2 diabetes mellitus with hyperglycemia 7.3 12/15/2018- Plan: Semaglutide 0.5MG  1x per week  Essential hypertension - Plan: losartan-hydrochlorothiazide (HYZAAR) 100-25 MG tablet, amLODipine (NORVASC) 2.5 MG tablet [facility] currently w/o job rec someone else not pt pick up meds as he had covid exposure  Allergic rhinitis, unspecified seasonality, unspecified trigger - Plan: cetirizine  (ZYRTEC) 10 MG tablet  Hyperlipidemia, unspecified hyperlipidemia type - Plan: atorvastatin (LIPITOR) 40 MG tablet  Glaucoma, c/o blurry vision and h/o DM 2  -[facility]  He is supposed to be on drops though ? Compliance    -we discussed possible serious and likely etiologies, options for evaluation and workup, limitations of telemedicine visit vs in person visit, treatment, treatment risks and precautions. Pt prefers to treat via telemedicine empirically rather then risking or undertaking an in person visit at this moment. Patient agrees to seek prompt in person care if worsening, new symptoms arise, or if is not improving with treatment.   I discussed the assessment and treatment plan with the patient. The patient was provided an opportunity to ask questions and all were answered. The patient agreed with the plan and demonstrated an understanding of the instructions.   The patient was advised to call back or seek an in-person evaluation if the symptoms worsen or if the condition fails to improve as  anticipated.  Time spent 15 minutes Delorise Jackson, MD

## 2019-07-05 ENCOUNTER — Other Ambulatory Visit: Payer: Self-pay

## 2019-07-05 DIAGNOSIS — Z20822 Contact with and (suspected) exposure to covid-19: Secondary | ICD-10-CM

## 2019-07-06 LAB — NOVEL CORONAVIRUS, NAA: SARS-CoV-2, NAA: NOT DETECTED

## 2019-08-02 ENCOUNTER — Other Ambulatory Visit: Payer: Self-pay

## 2019-08-02 ENCOUNTER — Ambulatory Visit (INDEPENDENT_AMBULATORY_CARE_PROVIDER_SITE_OTHER): Payer: Self-pay | Admitting: Internal Medicine

## 2019-08-02 VITALS — Ht 68.0 in | Wt 189.0 lb

## 2019-08-02 DIAGNOSIS — K4090 Unilateral inguinal hernia, without obstruction or gangrene, not specified as recurrent: Secondary | ICD-10-CM

## 2019-08-02 DIAGNOSIS — M19022 Primary osteoarthritis, left elbow: Secondary | ICD-10-CM

## 2019-08-02 DIAGNOSIS — I10 Essential (primary) hypertension: Secondary | ICD-10-CM

## 2019-08-02 DIAGNOSIS — E1165 Type 2 diabetes mellitus with hyperglycemia: Secondary | ICD-10-CM

## 2019-08-02 MED ORDER — PREDNISONE 20 MG PO TABS: 20.0000 mg | ORAL_TABLET | Freq: Every day | ORAL | 0 refills | Status: DC

## 2019-08-02 MED ORDER — OXYCODONE-ACETAMINOPHEN 5-325 MG PO TABS: 1.0000 | ORAL_TABLET | Freq: Two times a day (BID) | ORAL | 0 refills | Status: DC | PRN

## 2019-08-02 NOTE — Progress Notes (Signed)
Virtual Visit via Video Note  I connected with Alexander Powers   on 08/02/19 at  1:43 PM EST by a video enabled telemedicine application and verified that I am speaking with the correct person using two identifiers.  Location patient: home Location provider:work or home office Persons participating in the virtual visit: patient, provider  I discussed the limitations of evaluation and management by telemedicine and the availability of in person appointments. The patient expressed understanding and agreed to proceed.   HPI: 1. C/o right groin pain h/o right hernia yesterday working on a car and car almost fell on him and he had to move quickly and hurt himself. Tried ice to area and it numbed the area no bulging seen  2. C/o left elbow pain prev. Xray with severe arthritis 11/29/18 seen Dr. Roland Rack   For 1 &2 both pains 6-7/10 currently   3. DM 2 last A1C 7.3 12/15/2018 on ozempic 0.5 weekly reports compliance no labs can be obtained due to no insurance and cost currently   ROS: See pertinent positives and negatives per HPI.  Past Medical History:  Diagnosis Date  . Diabetes mellitus without complication ([place])   . Difficult intubation   . GERD (gastroesophageal reflux disease)    OCC- TUMS  . Glaucoma   . Hypercholesteremia   . Hypertension   . Strep throat    01/24/18     Past Surgical History:  Procedure Laterality Date  . HERNIA REPAIR    . INSERTION OF MESH N/A 10/22/2017   Procedure: INSERTION OF MESH;  Surgeon: Vickie Epley, MD;  Location: [facility];  Service: General;  Laterality: N/A;  . UMBILICAL HERNIA REPAIR N/A 10/22/2017   Procedure: HERNIA REPAIR UMBILICAL ADULT WITH MESH;  Surgeon: Vickie Epley, MD;  Location: [facility];  Service: General;  Laterality: N/A;    Family History  Problem Relation Age of Onset  . Diabetes Father   . Hyperlipidemia Father   . Hypertension Father   . Kidney disease Father   . Diabetes Paternal Uncle     SOCIAL HX: lives with  girlfriend    Current Outpatient Medications:  .  acetaminophen (TYLENOL) 500 MG tablet, Take 1 tablet (500 mg total) by mouth every 4 (four) hours as needed., Disp: 90 tablet, Rfl: 3 .  albuterol (VENTOLIN HFA) 108 (90 Base) MCG/ACT inhaler, Inhale 1-2 puffs into the lungs every 4 (four) hours as needed for wheezing or shortness of breath., Disp: 18 g, Rfl: 2 .  amLODipine (NORVASC) 2.5 MG tablet, Take 1 tablet (2.5 mg total) by mouth daily., Disp: 90 tablet, Rfl: 3 .  atorvastatin (LIPITOR) 40 MG tablet, Take 1 tablet (40 mg total) by mouth daily at 6 PM., Disp: 90 tablet, Rfl: 3 .  azithromycin (ZITHROMAX) 250 MG tablet, 2 pills day 1 and 1 pill day 2-5 with food, Disp: 6 tablet, Rfl: 0 .  bismuth subsalicylate (PEPTO-BISMOL) 262 MG/15ML suspension, Take 30 mLs by mouth 4 (four) times daily -  before meals and at bedtime., Disp: 473 mL, Rfl: 0 .  cetirizine (ZYRTEC) 10 MG tablet, Take 1 tablet (10 mg total) by mouth daily as needed for allergies., Disp: 90 tablet, Rfl: 3 .  Cholecalciferol 1.25 MG (50000 UT) capsule, Take 1 capsule (50,000 Units total) by mouth once a week., Disp: 13 capsule, Rfl: 1 .  Dextromethorphan-guaiFENesin (MUCINEX DM MAXIMUM STRENGTH) 60-1200 MG TB12, Take 1 tablet by mouth 2 (two) times daily as needed., Disp: 40 tablet, Rfl: 0 .  diclofenac sodium (VOLTAREN) 1 % GEL, Apply 2 g topically 4 (four) times daily., Disp: 100 g, Rfl: 12 .  latanoprost (XALATAN) 0.005 % ophthalmic solution, INSTILL 1 DROP INTO EACH EYE NIGHTLY, Disp: , Rfl: 12 .  losartan-hydrochlorothiazide (HYZAAR) 100-25 MG tablet, Take 1 tablet by mouth daily., Disp: 90 tablet, Rfl: 3 .  meloxicam (MOBIC) 15 MG tablet, Take 1 tablet (15 mg total) by mouth daily as needed for pain., Disp: 30 tablet, Rfl: 1 .  Semaglutide,0.25 or 0.5MG/DOS, 2 MG/1.5ML SOPN, Inject 0.5 mg into the skin every 7 (seven) days., Disp: 5 pen, Rfl: 12 .  oxyCODONE-acetaminophen (PERCOCET) 5-325 MG tablet, Take 1 tablet by mouth 2  (two) times daily as needed for severe pain., Disp: 10 tablet, Rfl: 0 .  predniSONE (DELTASONE) 20 MG tablet, Take 1 tablet (20 mg total) by mouth daily with breakfast., Disp: 7 tablet, Rfl: 0  EXAM:  VITALS per patient if applicable:  GENERAL: alert, oriented, appears well and in no acute distress  HEENT: atraumatic, conjunttiva clear, no obvious abnormalities on inspection of external nose and ears  NECK: normal movements of the head and neck  LUNGS: on inspection no signs of respiratory distress, breathing rate appears normal, no obvious gross SOB, gasping or wheezing  CV: no obvious cyanosis  MS: moves all visible extremities without noticeable abnormality  PSYCH/NEURO: pleasant and cooperative, no obvious depression or anxiety, speech and thought processing grossly intact  ASSESSMENT AND PLAN:  Discussed the following assessment and plan:  Right groin hernia - Plan: oxyCODONE-acetaminophen (PERCOCET) 5-325 MG tablet -temp supply only if worsening pain go to ED ed alarm sx's today   Arthritis of left elbow - Plan: predniSONE (DELTASONE) 20 MG tablet, oxyCODONE-acetaminophen (PERCOCET) 5-325 MG tablet Has voltaren gel  Ortho Dr. Roland Rack wanted to do surgery currently pt w/o insurance will [facility]   DM 2 -cont meds  Need to do labs cmet, cbc, lipid, A1C vitamin D, tSH, UA and urine protein and PSA currently pt w/o insurance and cant afford  HTN Pt reports compliance with meds no BP reading for today  [facility] [place] [facility] info sent to pt with no insurance and financial strain  HM Declines flu shot Tdap per pt had at [facility] and get records  -if cant find will need again  Consider shingrix vaccine in future Consider pna 23 in future MMR immune Consider hep B vaccine  PSA need to check in future currently no insurance  Colonoscopy in future currently  no insurance   -we discussed possible serious and likely etiologies, options for evaluation and workup, limitations of telemedicine visit vs in person visit, treatment, treatment risks and precautions. Pt prefers to treat via telemedicine empirically rather then risking or undertaking an in person visit at this moment. Patient agrees to seek prompt in person care if worsening, new symptoms arise, or if is not improving with treatment.   I discussed the assessment and treatment plan with the patient. The patient was provided an opportunity to ask questions and all were answered. The patient agreed with the plan and demonstrated an understanding of the instructions.   The patient was advised to call back or seek an in-person evaluation if the symptoms worsen or if the condition fails to improve as anticipated.  Time spent 15 minutes  Delorise Jackson, MD

## 2019-08-02 NOTE — Patient Instructions (Signed)
Arthritis Arthritis is a term that is commonly used to refer to joint pain or joint disease. There are more than 100 types of arthritis. What are the causes? The most common cause of this condition is wear and tear of a joint. Other causes include:  Gout.  Inflammation of a joint.  An infection of a joint.  Sprains and other injuries near the joint.  A reaction to medicines or drugs, or an allergic reaction. In some cases, the cause may not be known. What are the signs or symptoms? The main symptom of this condition is pain in the joint during movement. Other symptoms include:  Redness, swelling, or stiffness at a joint.  Warmth coming from the joint.  Fever.  Overall feeling of illness. How is this diagnosed? This condition may be diagnosed with a physical exam and tests, including:  Blood tests.  Urine tests.  Imaging tests, such as X-rays, an MRI, or a CT scan. Sometimes, fluid is removed from a joint for testing. How is this treated? This condition may be treated with:  Treatment of the cause, if it is known.  Rest.  Raising (elevating) the joint.  Applying cold or hot packs to the joint.  Medicines to improve symptoms and reduce inflammation.  Injections of a steroid such as cortisone into the joint to help reduce pain and inflammation. Depending on the cause of your arthritis, you may need to make lifestyle changes to reduce stress on your joint. Changes may include:  Exercising more.  Losing weight. Follow these instructions at home: Medicines  Take over-the-counter and prescription medicines only as told by your health care provider.  Do not take aspirin to relieve pain if your health care provider thinks that gout may be causing your pain. Activity  Rest your joint if told by your health care provider. Rest is important when your disease is active and your joint feels painful, swollen, or stiff.  Avoid activities that make the pain worse. It is  important to balance activity with rest.  Exercise your joint regularly with range-of-motion exercises as told by your health care provider. Try doing low-impact exercise, such as: ? Swimming. ? Water aerobics. ? Biking. ? Walking. Managing pain, stiffness, and swelling      If directed, put ice on the joint. ? Put ice in a plastic bag. ? Place a towel between your skin and the bag. ? Leave the ice on for 20 minutes, 2-3 times per day.  If your joint is swollen, raise (elevate) it above the level of your heart if directed by your health care provider.  If your joint feels stiff in the morning, try taking a warm shower.  If directed, apply heat to the affected area as often as told by your health care provider. Use the heat source that your health care provider recommends, such as a moist heat pack or a heating pad. If you have diabetes, do not apply heat without permission from your health care provider. To apply heat: ? Place a towel between your skin and the heat source. ? Leave the heat on for 20-30 minutes. ? Remove the heat if your skin turns bright red. This is especially important if you are unable to feel pain, heat, or cold. You may have a greater risk of getting burned. General instructions  Do not use any products that contain nicotine or tobacco, such as cigarettes, e-cigarettes, and chewing tobacco. If you need help quitting, ask your health care provider.  Keep   all follow-up visits as told by your health care provider. This is important. Contact a health care provider if:  The pain gets worse.  You have a fever. Get help right away if:  You develop severe joint pain, swelling, or redness.  Many joints become painful and swollen.  You develop severe back pain.  You develop severe weakness in your leg.  You cannot control your bladder or bowels. Summary  Arthritis is a term that is commonly used to refer to joint pain or joint disease. There are more than  100 types of arthritis.  The most common cause of this condition is wear and tear of a joint. Other causes include gout, inflammation or infection of the joint, sprains, or allergies.  Symptoms of this condition include redness, swelling, or stiffness of the joint. Other symptoms include warmth, fever, or feeling ill.  This condition is treated with rest, elevation, medicines, and applying cold or hot packs.  Follow your health care provider's instructions about medicines, activity, exercises, and other home care treatments. This information is not intended to replace advice given to you by your health care provider. Make sure you discuss any questions you have with your health care provider. Document Released: 09/03/2004 Document Revised: 07/04/2018 Document Reviewed: 07/04/2018 Elsevier Patient Education  2020 [employer]  Hernia, Adult     A hernia is the bulging of an organ or tissue through a weak spot in the muscles of the abdomen (abdominal wall). Hernias develop most often near the belly button (navel) or the area where the leg meets the lower abdomen (groin). Common types of hernias include:  Incisional hernia. This type bulges through a scar from an abdominal surgery.  Umbilical hernia. This type develops near the navel.  Inguinal hernia. This type develops in the groin or scrotum.  Femoral hernia. This type develops under the groin, in the upper thigh area.  Hiatal hernia. This type occurs when part of the stomach slides above the muscle that separates the abdomen from the chest (diaphragm). What are the causes? This condition may be caused by:  Heavy lifting.  Coughing over a long period of time.  Straining to have a bowel movement. Constipation can lead to straining.  An incision made during an abdominal surgery.  A physical problem that is present at birth (congenital defect).  Being overweight or obese.  Smoking.  Excess fluid in the  abdomen.  Undescended testicles in males. What are the signs or symptoms? The main symptom is a skin-colored, rounded bulge in the area of the hernia. However, a bulge may not always be present. It may grow bigger or be more visible when you cough or strain (such as when lifting something heavy). A hernia that can be pushed back into the area (is reducible) rarely causes pain. A hernia that cannot be pushed back into the area (is incarcerated) may lose its blood supply (become strangulated). A hernia that is incarcerated may cause:  Pain.  Fever.  Nausea and vomiting.  Swelling.  Constipation. How is this diagnosed? A hernia may be diagnosed based on:  Your symptoms and medical history.  A physical exam. Your health care provider may ask you to cough or move in certain ways to see if the hernia becomes visible.  Imaging tests, such as: ? X-rays. ? Ultrasound. ? CT scan. How is this treated? A hernia that is small and painless may not need to be treated. A hernia that is large or painful may be treated  with surgery. Inguinal hernias may be treated with surgery to prevent incarceration or strangulation. Strangulated hernias are always treated with surgery because a lack of blood supply to the trapped organ or tissue can cause it to die. Surgery to treat a hernia involves pushing the bulge back into place and repairing the weak area of the muscle or abdominal wall. Follow these instructions at home: Activity  Avoid straining.  Do not lift anything that is heavier than 10 lb (4.5 kg), or the limit that you are told, until your health care provider says that it is safe.  When lifting heavy objects, lift with your leg muscles, not your back muscles. Preventing constipation  Take actions to prevent constipation. Constipation leads to straining with bowel movements, which can make a hernia worse or cause a hernia repair to break down. Your health care provider may recommend that  you: ? Drink enough fluid to keep your urine pale yellow. ? Eat foods that are high in fiber, such as fresh fruits and vegetables, whole grains, and beans. ? Limit foods that are high in fat and processed sugars, such as fried or sweet foods. ? Take an over-the-counter or prescription medicine for constipation. General instructions  When coughing, try to cough gently.  You may try to push the hernia back in place by very gently pressing on it while lying down. Do not try to force the bulge back in if it will not push in easily.  If you are overweight, work with your health care provider to lose weight safely.  Do not use any products that contain nicotine or tobacco, such as cigarettes and e-cigarettes. If you need help quitting, ask your health care provider.  If you are scheduled for hernia repair, watch your hernia for any changes in shape, size, or color. Tell your health care provider about any changes or new symptoms.  Take over-the-counter and prescription medicines only as told by your health care provider.  Keep all follow-up visits as told by your health care provider. This is important. Contact a health care provider if:  You develop new pain, swelling, or redness around your hernia.  You have signs of constipation, such as: ? Fewer bowel movements in a week than normal. ? Difficulty having a bowel movement. ? Stools that are dry, hard, or larger than normal. Get help right away if:  You have a fever.  You have abdomen pain that gets worse.  You feel nauseous or you vomit.  You cannot push the hernia back in place by very gently pressing on it while lying down. Do not try to force the bulge back in if it will not push in easily.  The hernia: ? Changes in shape, size, or color. ? Feels hard or tender. These symptoms may represent a serious problem that is an emergency. Do not wait to see if the symptoms will go away. Get medical help right away. Call your local  emergency services (911 in the [place] Summary  A hernia is the bulging of an organ or tissue through a weak spot in the muscles of the abdomen (abdominal wall).  The main symptom is a skin-colored, rounded lump (bulge) in the hernia area. However, a bulge may not always be present. It may grow bigger or more visible when you cough or strain (such as when having a bowel movement).  A hernia that is small and painless may not need to be treated. A hernia that is large or painful may be  treated with surgery.  Surgery to treat a hernia involves pushing the bulge back into place and repairing the weak part of the abdomen. This information is not intended to replace advice given to you by your health care provider. Make sure you discuss any questions you have with your health care provider. Document Released: 07/27/2005 Document Revised: 11/17/2018 Document Reviewed: 04/28/2017 Elsevier Patient Education  2020 [employer].

## 2019-08-03 ENCOUNTER — Encounter: Payer: Self-pay | Admitting: Internal Medicine

## 2019-08-03 DIAGNOSIS — M19022 Primary osteoarthritis, left elbow: Secondary | ICD-10-CM | POA: Insufficient documentation

## 2019-08-03 DIAGNOSIS — K4090 Unilateral inguinal hernia, without obstruction or gangrene, not specified as recurrent: Secondary | ICD-10-CM | POA: Insufficient documentation

## 2019-08-03 NOTE — Telephone Encounter (Signed)
Financial paperwork has been sent out to the patient 12.24.20. He has a follow up visit scheduled 3.26.21.

## 2019-08-03 NOTE — Telephone Encounter (Signed)
-----   Message from Delorise Jackson, MD sent at 08/03/2019  7:38 AM EST ----- Pt needs info for charity care Also he is self pay not sure how sustainable this is but if not rec 1. [facility]. [facility] 3. [facility] Until he gets insurance again But if would like can f/u in 3-6 months

## 2019-11-01 ENCOUNTER — Telehealth: Payer: Self-pay | Admitting: Pharmacy Technician

## 2019-11-01 ENCOUNTER — Ambulatory Visit: Payer: Self-pay | Admitting: Internal Medicine

## 2019-11-01 NOTE — Telephone Encounter (Signed)
Patient failed to provide 2021 poi.  No additional medication assistance will be provided by [facility] without the required proof of income documentation.  Patient notified by letter.  Sherilyn Dacosta Care Manager [facility]   [address] [place], [place]  [address]     This is to inform you that you are no longer eligible to receive medication assistance at [facility].  The reason(s) are:    _____Your total gross monthly household income exceeds 250% of the Federal Poverty Level.   _____Tangible assets (savings, checking, stocks/bonds, pension, retirement, etc.) exceeds our limit  _____You are eligible to receive benefits from [facility], [facility] or HIV Medication              Assistance Program _____You are eligible to receive benefits from a Medicare Part "D" plan _____You have prescription insurance  _____You are not an [facility] resident __X__Failure to provide all requested proof of income information for 2021.    Medication assistance will resume once all requested financial information has been returned to our clinic.  If you have questions, please contact our clinic at [phone].    Thank you,  [facility]

## 2019-11-03 ENCOUNTER — Ambulatory Visit: Payer: Self-pay | Admitting: Internal Medicine

## 2019-11-06 ENCOUNTER — Ambulatory Visit
Admission: EM | Admit: 2019-11-06 | Discharge: 2019-11-06 | Disposition: A | Discharge status: Home / Self Care | Payer: HRSA Program | Attending: Family Medicine | Admitting: Family Medicine

## 2019-11-06 ENCOUNTER — Other Ambulatory Visit: Payer: Self-pay

## 2019-11-06 ENCOUNTER — Encounter: Payer: Self-pay | Admitting: Emergency Medicine

## 2019-11-06 DIAGNOSIS — Z20822 Contact with and (suspected) exposure to covid-19: Secondary | ICD-10-CM | POA: Insufficient documentation

## 2019-11-06 DIAGNOSIS — Z79899 Other long term (current) drug therapy: Secondary | ICD-10-CM | POA: Diagnosis not present

## 2019-11-06 DIAGNOSIS — H409 Unspecified glaucoma: Secondary | ICD-10-CM | POA: Diagnosis not present

## 2019-11-06 DIAGNOSIS — F17210 Nicotine dependence, cigarettes, uncomplicated: Secondary | ICD-10-CM | POA: Insufficient documentation

## 2019-11-06 DIAGNOSIS — Z833 Family history of diabetes mellitus: Secondary | ICD-10-CM | POA: Diagnosis not present

## 2019-11-06 DIAGNOSIS — E119 Type 2 diabetes mellitus without complications: Secondary | ICD-10-CM | POA: Diagnosis not present

## 2019-11-06 DIAGNOSIS — I10 Essential (primary) hypertension: Secondary | ICD-10-CM | POA: Insufficient documentation

## 2019-11-06 DIAGNOSIS — B349 Viral infection, unspecified: Secondary | ICD-10-CM | POA: Insufficient documentation

## 2019-11-06 DIAGNOSIS — Z8249 Family history of ischemic heart disease and other diseases of the circulatory system: Secondary | ICD-10-CM | POA: Insufficient documentation

## 2019-11-06 DIAGNOSIS — Z8349 Family history of other endocrine, nutritional and metabolic diseases: Secondary | ICD-10-CM | POA: Insufficient documentation

## 2019-11-06 DIAGNOSIS — E559 Vitamin D deficiency, unspecified: Secondary | ICD-10-CM | POA: Insufficient documentation

## 2019-11-06 DIAGNOSIS — E785 Hyperlipidemia, unspecified: Secondary | ICD-10-CM | POA: Insufficient documentation

## 2019-11-06 DIAGNOSIS — R05 Cough: Secondary | ICD-10-CM

## 2019-11-06 DIAGNOSIS — R0981 Nasal congestion: Secondary | ICD-10-CM

## 2019-11-06 DIAGNOSIS — R0602 Shortness of breath: Secondary | ICD-10-CM

## 2019-11-06 NOTE — Discharge Instructions (Signed)
Rest, fluids, over the counter cold/cough medication as needed Await covid test

## 2019-11-06 NOTE — ED Provider Notes (Signed)
[facility]    CSN: [id number] Arrival date & time: 11/06/19  1227      History   Chief Complaint Chief Complaint  Patient presents with  . Shortness of Breath    HPI Alexander Powers is a 54 y.o. male.   54 yo male with a c/o cough, runny nose, shortness of breath and body aches since last night. Denies any fevers, chills, vomiting, diarrhea,  loss of taste or smell or known covid exposure.    Shortness of Breath   Past Medical History:  Diagnosis Date  . Diabetes mellitus without complication (HCC)   . Difficult intubation   . GERD (gastroesophageal reflux disease)    OCC- TUMS  . Glaucoma   . Hypercholesteremia   . Hypertension   . Strep throat    01/24/18     Patient Active Problem List   Diagnosis Date Noted  . 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 Right groin hernia 08/03/2019  . Vitamin D deficiency 12/15/2018  . DM2 (diabetes mellitus, type 2) (HCC) 04/21/2018  . HTN (hypertension) 04/21/2018  . HLD (hyperlipidemia) 04/21/2018  . Glaucoma 04/21/2018  . Umbilical hernia without obstruction or gangrene     Past Surgical History:  Procedure Laterality Date  . HERNIA REPAIR    . INSERTION OF MESH N/A 10/22/2017   Procedure: INSERTION OF MESH;  Surgeon: Ancil Linsey, MD;  Location: [facility] ORS;  Service: General;  Laterality: N/A;  . UMBILICAL HERNIA REPAIR N/A 10/22/2017   Procedure: HERNIA REPAIR UMBILICAL ADULT WITH MESH;  Surgeon: Ancil Linsey, MD;  Location: [facility] ORS;  Service: General;  Laterality: N/A;       Home Medications    Prior to Admission medications   Medication Sig Start Date End Date Taking? Authorizing Provider  acetaminophen (TYLENOL) 500 MG tablet Take 1 tablet (500 mg total) by mouth every 4 (four) hours as needed. 03/22/19  Yes McLean-Scocuzza, Pasty Spillers, MD  albuterol (VENTOLIN HFA) 108 (90 Base) MCG/ACT inhaler Inhale 1-2 puffs into the lungs every 4 (four) hours as needed for wheezing or shortness of breath.  07/04/19  Yes McLean-Scocuzza, Pasty Spillers, MD  amLODipine (NORVASC) 2.5 MG tablet Take 1 tablet (2.5 mg total) by mouth daily. 07/04/19  Yes McLean-Scocuzza, Pasty Spillers, MD  atorvastatin (LIPITOR) 40 MG tablet Take 1 tablet (40 mg total) by mouth daily at 6 PM. 07/04/19  Yes McLean-Scocuzza, Pasty Spillers, MD  cetirizine (ZYRTEC) 10 MG tablet Take 1 tablet (10 mg total) by mouth daily as needed for allergies. 07/04/19  Yes McLean-Scocuzza, Pasty Spillers, MD  Dextromethorphan-guaiFENesin [facility] DM MAXIMUM STRENGTH) 60-1200 MG TB12 Take 1 tablet by mouth 2 (two) times daily as needed. 07/04/19  Yes McLean-Scocuzza, Pasty Spillers, MD  diclofenac sodium (VOLTAREN) 1 % GEL Apply 2 g topically 4 (four) times daily. 12/14/18  Yes McLean-Scocuzza, Pasty Spillers, MD  latanoprost (XALATAN) 0.005 % ophthalmic solution INSTILL 1 DROP INTO EACH EYE NIGHTLY 03/14/18  Yes [provider]  losartan-hydrochlorothiazide (HYZAAR) 100-25 MG tablet Take 1 tablet by mouth daily. 07/04/19  Yes McLean-Scocuzza, Pasty Spillers, MD  meloxicam (MOBIC) 15 MG tablet Take 1 tablet (15 mg total) by mouth daily as needed for pain. 11/29/18  Yes Cook, Jayce G, DO  Semaglutide,0.25 or 0.5MG /DOS, 2 MG/1.5ML SOPN Inject 0.5 mg into the skin every 7 (seven) days. 07/04/19  Yes McLean-Scocuzza, Pasty Spillers, MD  azithromycin (ZITHROMAX) 250 MG tablet 2 pills day 1 and 1 pill day 2-5 with food 07/04/19  McLean-Scocuzza, Nino Glow, MD  bismuth subsalicylate (PEPTO-BISMOL) 262 MG/15ML suspension Take 30 mLs by mouth 4 (four) times daily -  before meals and at bedtime. 03/22/19   McLean-Scocuzza, Nino Glow, MD  Cholecalciferol 1.25 MG (50000 UT) capsule Take 1 capsule (50,000 Units total) by mouth once a week. 12/15/18   McLean-Scocuzza, Nino Glow, MD  oxyCODONE-acetaminophen (PERCOCET) 5-325 MG tablet Take 1 tablet by mouth 2 (two) times daily as needed for severe pain. 08/02/19   McLean-Scocuzza, Nino Glow, MD  predniSONE (DELTASONE) 20 MG tablet Take 1 tablet (20 mg total) by mouth  daily with breakfast. 08/02/19   McLean-Scocuzza, Nino Glow, MD    Family History Family History  Problem Relation Age of Onset  . Diabetes Father   . Hyperlipidemia Father   . Hypertension Father   . Kidney disease Father   . Diabetes Paternal Uncle     Social History Social History   Tobacco Use  . Smoking status: Current Every Day Smoker    Packs/day: 0.50    Years: 27.00    Pack years: 13.50    Types: Cigarettes  . Smokeless tobacco: Never Used  . Tobacco comment: age 82-37 1/2 ppd quit age 8   Substance Use Topics  . Alcohol use: Yes    Comment: occasionally  . Drug use: Yes    Types: Marijuana     Allergies   Patient has no known allergies.   Review of Systems Review of Systems  Respiratory: Positive for shortness of breath.      Physical Exam Triage Vital Signs ED Triage Vitals  Enc Vitals Group     BP 11/06/19 1309 139/72     Pulse Rate 11/06/19 1309 81     Resp 11/06/19 1309 18     Temp 11/06/19 1309 98.6 F (37 C)     Temp Source 11/06/19 1309 Oral     SpO2 11/06/19 1309 100 %     Weight 11/06/19 1303 187 lb (84.8 kg)     Height 11/06/19 1303 5\' 8"  (1.727 m)     Head Circumference --      Peak Flow --      Pain Score 11/06/19 1303 7     Pain Loc --      Pain Edu? --      Excl. in [place]? --    No data found.  Updated Vital Signs BP 139/72 (BP Location: Right Arm)   Pulse 81   Temp 98.6 F (37 C) (Oral)   Resp 18   Ht 5\' 8"  (1.727 m)   Wt 84.8 kg   SpO2 100%   BMI 28.43 kg/m   Visual Acuity Right Eye Distance:   Left Eye Distance:   Bilateral Distance:    Right Eye Near:   Left Eye Near:    Bilateral Near:     Physical Exam Vitals and nursing note reviewed.  Constitutional:      General: He is not in acute distress.    Appearance: He is not toxic-appearing or diaphoretic.  Cardiovascular:     Rate and Rhythm: Normal rate.     Heart sounds: Normal heart sounds.  Pulmonary:     Effort: Pulmonary effort is normal. No  respiratory distress.     Breath sounds: Normal breath sounds.  Neurological:     Mental Status: He is alert.      UC Treatments / Results  Labs (all labs ordered are listed, but only abnormal results are displayed) Labs  Reviewed  SARS CORONAVIRUS 2 (TAT 6-24 HRS)    EKG   Radiology No results found.  Procedures Procedures (including critical care time)  Medications Ordered in UC Medications - No data to display  Initial Impression / Assessment and Plan / UC Course  I have reviewed the triage vital signs and the nursing notes.  Pertinent labs & imaging results that were available during my care of the patient were reviewed by me and considered in my medical decision making (see chart for details).      Final Clinical Impressions(s) / UC Diagnoses   Final diagnoses:  Viral syndrome     Discharge Instructions     Rest, fluids, over the counter cold/cough medication as needed Await covid test    ED Prescriptions    None      1. diagnosis reviewed with patient 2. Recommend supportive treatment with rest, fluids, over the counter medications as needed 3. Await covid test 4. Follow-up prn if symptoms worsen or don't improve  PDMP not reviewed this encounter.   Payton Mccallum, MD 11/06/19 [phone]

## 2019-11-06 NOTE — ED Triage Notes (Addendum)
Pt c/o shortness of breath, cough, runny nose, chest tightness, weakness and muscle soreness. Started last night. Denies covid exposure.

## 2019-11-07 LAB — SARS CORONAVIRUS 2 (TAT 6-24 HRS): SARS Coronavirus 2: NEGATIVE

## 2019-11-17 ENCOUNTER — Encounter: Payer: Self-pay | Admitting: Internal Medicine

## 2019-11-17 ENCOUNTER — Telehealth (INDEPENDENT_AMBULATORY_CARE_PROVIDER_SITE_OTHER): Payer: Self-pay | Admitting: Internal Medicine

## 2019-11-17 VITALS — Ht 68.0 in | Wt 190.0 lb

## 2019-11-17 DIAGNOSIS — K4090 Unilateral inguinal hernia, without obstruction or gangrene, not specified as recurrent: Secondary | ICD-10-CM

## 2019-11-17 DIAGNOSIS — E785 Hyperlipidemia, unspecified: Secondary | ICD-10-CM

## 2019-11-17 DIAGNOSIS — E559 Vitamin D deficiency, unspecified: Secondary | ICD-10-CM

## 2019-11-17 DIAGNOSIS — H409 Unspecified glaucoma: Secondary | ICD-10-CM

## 2019-11-17 DIAGNOSIS — I10 Essential (primary) hypertension: Secondary | ICD-10-CM

## 2019-11-17 DIAGNOSIS — Z125 Encounter for screening for malignant neoplasm of prostate: Secondary | ICD-10-CM

## 2019-11-17 DIAGNOSIS — E1165 Type 2 diabetes mellitus with hyperglycemia: Secondary | ICD-10-CM

## 2019-11-17 DIAGNOSIS — M19022 Primary osteoarthritis, left elbow: Secondary | ICD-10-CM

## 2019-11-17 DIAGNOSIS — M19029 Primary osteoarthritis, unspecified elbow: Secondary | ICD-10-CM

## 2019-11-17 DIAGNOSIS — Z1329 Encounter for screening for other suspected endocrine disorder: Secondary | ICD-10-CM

## 2019-11-17 MED ORDER — OXYCODONE-ACETAMINOPHEN 5-325 MG PO TABS: 1.0000 | ORAL_TABLET | Freq: Two times a day (BID) | ORAL | 0 refills | Status: DC | PRN

## 2019-11-17 NOTE — Progress Notes (Signed)
telephone Note  I connected with Shaaron Adler  on 11/17/19 at  3:00 PM EDT by telephone and verified that I am speaking with the correct person using two identifiers.  Location patient: home Location provider:work or home office Persons participating in the virtual visit: patient, provider  I discussed the limitations of evaluation and management by telemedicine and the availability of in person appointments. The patient expressed understanding and agreed to proceed.   HPI: 1. HTN on hyzaar 100-25 and norvasc 2.5 mg qd not checked BP 2. DM 2 on ozempic 0.5 mg weekly per pt compliant with meds 3. Left elbow pain 8/10 steroid inj x 1 helped 12/2018 but pain back and voltaren gel helps  4. Glaucoma due to f/u [facility] will refer insurance does not start until 12/09/19   ROS: See pertinent positives and negatives per HPI.  Past Medical History:  Diagnosis Date  . Diabetes mellitus without complication ([place])   . Difficult intubation   . GERD (gastroesophageal reflux disease)    OCC- TUMS  . Glaucoma   . Hypercholesteremia   . Hypertension   . Strep throat    01/24/18     Past Surgical History:  Procedure Laterality Date  . HERNIA REPAIR    . INSERTION OF MESH N/A 10/22/2017   Procedure: INSERTION OF MESH;  Surgeon: Vickie Epley, MD;  Location: [facility] ORS;  Service: General;  Laterality: N/A;  . UMBILICAL HERNIA REPAIR N/A 10/22/2017   Procedure: HERNIA REPAIR UMBILICAL ADULT WITH MESH;  Surgeon: Vickie Epley, MD;  Location: [facility] ORS;  Service: General;  Laterality: N/A;    Family History  Problem Relation Age of Onset  . Diabetes Father   . Hyperlipidemia Father   . Hypertension Father   . Kidney disease Father   . Diabetes Paternal Uncle     SOCIAL HX: lives with GF   Current Outpatient Medications:  .  acetaminophen (TYLENOL) 500 MG tablet, Take 1 tablet (500 mg total) by mouth every 4 (four) hours as needed., Disp: 90 tablet, Rfl: 3 .  albuterol (VENTOLIN HFA) 108  (90 Base) MCG/ACT inhaler, Inhale 1-2 puffs into the lungs every 4 (four) hours as needed for wheezing or shortness of breath., Disp: 18 g, Rfl: 2 .  amLODipine (NORVASC) 2.5 MG tablet, Take 1 tablet (2.5 mg total) by mouth daily., Disp: 90 tablet, Rfl: 3 .  atorvastatin (LIPITOR) 40 MG tablet, Take 1 tablet (40 mg total) by mouth daily at 6 PM., Disp: 90 tablet, Rfl: 3 .  cetirizine (ZYRTEC) 10 MG tablet, Take 1 tablet (10 mg total) by mouth daily as needed for allergies., Disp: 90 tablet, Rfl: 3 .  Cholecalciferol 1.25 MG (50000 UT) capsule, Take 1 capsule (50,000 Units total) by mouth once a week., Disp: 13 capsule, Rfl: 1 .  Dextromethorphan-guaiFENesin (MUCINEX DM MAXIMUM STRENGTH) 60-1200 MG TB12, Take 1 tablet by mouth 2 (two) times daily as needed., Disp: 40 tablet, Rfl: 0 .  diclofenac sodium (VOLTAREN) 1 % GEL, Apply 2 g topically 4 (four) times daily., Disp: 100 g, Rfl: 12 .  latanoprost (XALATAN) 0.005 % ophthalmic solution, INSTILL 1 DROP INTO EACH EYE NIGHTLY, Disp: , Rfl: 12 .  losartan-hydrochlorothiazide (HYZAAR) 100-25 MG tablet, Take 1 tablet by mouth daily., Disp: 90 tablet, Rfl: 3 .  Semaglutide,0.25 or 0.5MG/DOS, 2 MG/1.5ML SOPN, Inject 0.5 mg into the skin every 7 (seven) days., Disp: 5 pen, Rfl: 12 .  meloxicam (MOBIC) 15 MG tablet, Take 1 tablet (15 mg  total) by mouth daily as needed for pain. (Patient not taking: Reported on 11/17/2019), Disp: 30 tablet, Rfl: 1 .  oxyCODONE-acetaminophen (PERCOCET) 5-325 MG tablet, Take 1 tablet by mouth 2 (two) times daily as needed for severe pain., Disp: 10 tablet, Rfl: 0 .  predniSONE (DELTASONE) 20 MG tablet, Take 1 tablet (20 mg total) by mouth daily with breakfast. (Patient not taking: Reported on 11/17/2019), Disp: 7 tablet, Rfl: 0  EXAM:  VITALS per patient if applicable:  GENERAL: alert, oriented, appears well and in no acute distress  PSYCH/NEURO: pleasant and cooperative, no obvious depression or anxiety, speech and thought  processing grossly intact  ASSESSMENT AND PLAN:  Discussed the following assessment and plan:  Elbow arthritis - Plan: Ambulatory referral to Orthopedic Surgery Needs appt after 12/09/19  Percocet  Prn voltaren   Type 2 diabetes mellitus with hyperglycemia, without long-term current use of insulin ([place]) - Plan: Hemoglobin A1c, Urinalysis, Routine w reflex microscopic, Microalbumin / creatinine urine ratio, Ambulatory referral to Ophthalmology Cont meds   Essential hypertension - Plan: Comprehensive metabolic panel, Lipid panel, CBC with Differential/Platelet Cont meds  Fasting labs next visit in person   Hyperlipidemia, unspecified hyperlipidemia type Cont meds   Glaucoma, unspecified glaucoma type, unspecified laterality - Plan: Ambulatory referral to Ophthalmology [facility]   HM Declines flu shot Tdap per pt had at [facility] check [place] and get records  -if cant find will need again  Consider shingrix vaccine in future Consider pna 23 in future MMR immune Consider hep B vaccine PSA need to check in future currently no insurance  Colonoscopy in future currently no insurance    -we discussed possible serious and likely etiologies, options for evaluation and workup, limitations of telemedicine visit vs in person visit, treatment, treatment risks and precautions. Pt prefers to treat via telemedicine empirically rather then risking or undertaking an in person visit at this moment. Patient agrees to seek prompt in person care if worsening, new symptoms arise, or if is not improving with treatment.   I discussed the assessment and treatment plan with the patient. The patient was provided an opportunity to ask questions and all were answered. The patient agreed with the plan and demonstrated an understanding of the instructions.   The patient was advised to call back or seek an in-person evaluation if the symptoms worsen or if the condition fails to improve as anticipated.  Time spent 20  minutes Delorise Jackson, MD

## 2019-11-20 ENCOUNTER — Telehealth: Payer: Self-pay | Admitting: Internal Medicine

## 2019-11-20 NOTE — Telephone Encounter (Signed)
I called pt and left vm to call ofc regarding  to follow up if pt has ins.

## 2019-11-29 ENCOUNTER — Ambulatory Visit: Payer: Self-pay | Admitting: Pharmacy Technician

## 2019-11-29 ENCOUNTER — Other Ambulatory Visit: Payer: Self-pay

## 2019-11-29 DIAGNOSIS — Z79899 Other long term (current) drug therapy: Secondary | ICD-10-CM

## 2019-11-29 NOTE — Progress Notes (Signed)
Provided patient with [facility] financial assistance application.  Patient agreed to be responsible for gathering financial information and forwarding to appropriate department in [facility].    Completed [facility] application and contract.  Patient agreed to all terms of the [facility] contract.    Patient approved to receive medication assistance at [facility], until time to re-certify in 3475, and as long as eligibility criteria continues to be met.    Provided patient with Civil engineer, contracting based on his particular needs.    Referred patient for MTM.  [place] [facility]

## 2019-11-30 ENCOUNTER — Telehealth: Payer: Self-pay | Admitting: Pharmacist

## 2019-11-30 NOTE — Telephone Encounter (Signed)
11/30/2019 10:08:33 AM - Ventolin & Ozempic papers to pt & dr  -- Alexander Powers - Thursday, November 30, 2019 10:05 AM --I received pharmacy printouts for Ozempic Inject 0.5mg  into the skin every 7 days-once a week #16 & Albuterol HFA/Ventolin HFA Inhale 1-2 puffs into the lungs every 4 hours as needed for wheezing or shortness of breath #3--Mailing forms to patient to sign & return, also mailing forms to provider @ [employer] to sign & return.

## 2019-12-18 ENCOUNTER — Telehealth: Payer: Self-pay | Admitting: Internal Medicine

## 2019-12-18 NOTE — Telephone Encounter (Signed)
Pt dropped off disability form to be completed. Placed in colored folder for Dr. French Ana in front office. Pt would like a call back once form is completed. I did let patient know this could take 7-10 business days before completion.

## 2019-12-19 ENCOUNTER — Telehealth: Payer: Self-pay | Admitting: Pharmacist

## 2019-12-19 NOTE — Telephone Encounter (Signed)
Spoke with Dr French Ana and she states she does not do Disability long term for patients.  Was given the number for [employer] to give to the patient as they do these evaluations. [phone]).  Patient informed and verbalized understanding. He will come back in office to pick up this paperwork.

## 2019-12-19 NOTE — Telephone Encounter (Signed)
12/19/2019 8:37:17 AM - Ozempic faxed to [employer] -- [name] - Tuesday, Dec 19, 2019 8:36 AM -- [employer] application for enrollment for [employer] 0.5mg  once weekly #16 boxes.  12/19/2019 8:36:31 AM - Ventolin script & app to [employer]  -- [name] - Tuesday, Dec 19, 2019 8:35 AM --Faxed [employer] application for enrollment for Ventolin HFA Inhale 1-2 puffs into the lungs every 4 hours as needed for wheezing or shortness of breath #3.

## 2019-12-22 ENCOUNTER — Other Ambulatory Visit (INDEPENDENT_AMBULATORY_CARE_PROVIDER_SITE_OTHER): Payer: Self-pay

## 2019-12-22 ENCOUNTER — Other Ambulatory Visit: Payer: Self-pay

## 2019-12-22 DIAGNOSIS — I10 Essential (primary) hypertension: Secondary | ICD-10-CM

## 2019-12-22 DIAGNOSIS — E1165 Type 2 diabetes mellitus with hyperglycemia: Secondary | ICD-10-CM

## 2019-12-22 DIAGNOSIS — Z1329 Encounter for screening for other suspected endocrine disorder: Secondary | ICD-10-CM

## 2019-12-22 DIAGNOSIS — Z125 Encounter for screening for malignant neoplasm of prostate: Secondary | ICD-10-CM

## 2019-12-22 DIAGNOSIS — E559 Vitamin D deficiency, unspecified: Secondary | ICD-10-CM

## 2019-12-22 LAB — COMPREHENSIVE METABOLIC PANEL
ALT: 16 U/L (ref 0–53)
AST: 12 U/L (ref 0–37)
Albumin: 4.1 g/dL (ref 3.5–5.2)
Alkaline Phosphatase: 75 U/L (ref 39–117)
BUN: 13 mg/dL (ref 6–23)
CO2: 27 mEq/L (ref 19–32)
Calcium: 9.5 mg/dL (ref 8.4–10.5)
Chloride: 104 mEq/L (ref 96–112)
Creatinine, Ser: 0.91 mg/dL (ref 0.40–1.50)
GFR: 104.19 mL/min (ref >60.00)
Glucose, Bld: 128 mg/dL — ABNORMAL HIGH (ref 70–99)
Potassium: 3.8 mEq/L (ref 3.5–5.1)
Sodium: 138 mEq/L (ref 135–145)
Total Bilirubin: 0.4 mg/dL (ref 0.2–1.2)
Total Protein: 6.6 g/dL (ref 6.0–8.3)

## 2019-12-22 LAB — CBC WITH DIFFERENTIAL/PLATELET
Basophils Absolute: 0 10*3/uL (ref 0.0–0.1)
Basophils Relative: 0.3 % (ref 0.0–3.0)
Eosinophils Absolute: 0.2 10*3/uL (ref 0.0–0.7)
Eosinophils Relative: 2.3 % (ref 0.0–5.0)
HCT: 48.8 % (ref 39.0–52.0)
Hemoglobin: 16.2 g/dL (ref 13.0–17.0)
Lymphocytes Relative: 33.8 % (ref 12.0–46.0)
Lymphs Abs: 3.1 10*3/uL (ref 0.7–4.0)
MCHC: 33.1 g/dL (ref 30.0–36.0)
MCV: 79.5 fl (ref 78.0–100.0)
Monocytes Absolute: 0.6 10*3/uL (ref 0.1–1.0)
Monocytes Relative: 6.3 % (ref 3.0–12.0)
Neutro Abs: 5.3 10*3/uL (ref 1.4–7.7)
Neutrophils Relative %: 57.3 % (ref 43.0–77.0)
Platelets: 289 10*3/uL (ref 150.0–400.0)
RBC: 6.14 Mil/uL — ABNORMAL HIGH (ref 4.22–5.81)
RDW: 14.6 % (ref 11.5–15.5)
WBC: 9.3 10*3/uL (ref 4.0–10.5)

## 2019-12-22 LAB — LIPID PANEL
Cholesterol: 263 mg/dL — ABNORMAL HIGH (ref 0–200)
HDL: 49.3 mg/dL (ref >39.00)
LDL Cholesterol: 192 mg/dL — ABNORMAL HIGH (ref 0–99)
NonHDL: 213.94
Total CHOL/HDL Ratio: 5
Triglycerides: 110 mg/dL (ref 0.0–149.0)
VLDL: 22 mg/dL (ref 0.0–40.0)

## 2019-12-22 LAB — HEMOGLOBIN A1C: Hgb A1c MFr Bld: 7.5 % — ABNORMAL HIGH (ref 4.6–6.5)

## 2019-12-22 LAB — TSH: TSH: 3.78 u[IU]/mL (ref 0.35–4.50)

## 2019-12-22 LAB — VITAMIN D 25 HYDROXY (VIT D DEFICIENCY, FRACTURES): VITD: 20.99 ng/mL — ABNORMAL LOW (ref 30.00–100.00)

## 2019-12-22 LAB — PSA: PSA: 0.46 ng/mL (ref 0.10–4.00)

## 2019-12-23 LAB — URINALYSIS, ROUTINE W REFLEX MICROSCOPIC
Bilirubin Urine: NEGATIVE
Glucose, UA: NEGATIVE
Hgb urine dipstick: NEGATIVE
Ketones, ur: NEGATIVE
Leukocytes,Ua: NEGATIVE
Nitrite: NEGATIVE
Protein, ur: NEGATIVE
Specific Gravity, Urine: 1.026 (ref 1.001–1.03)
pH: 5.5 (ref 5.0–8.0)

## 2019-12-23 LAB — MICROALBUMIN / CREATININE URINE RATIO
Creatinine, Urine: 247 mg/dL (ref 20–320)
Microalb Creat Ratio: 4 mcg/mg creat (ref <30)
Microalb, Ur: 1 mg/dL

## 2020-01-01 ENCOUNTER — Other Ambulatory Visit: Payer: Self-pay

## 2020-01-01 ENCOUNTER — Emergency Department (HOSPITAL_COMMUNITY)
Admission: EM | Admit: 2020-01-01 | Discharge: 2020-01-01 | Disposition: A | Discharge status: Home / Self Care | Payer: Self-pay | Attending: Emergency Medicine | Admitting: Emergency Medicine

## 2020-01-01 ENCOUNTER — Emergency Department (HOSPITAL_COMMUNITY): Payer: Self-pay

## 2020-01-01 DIAGNOSIS — F17210 Nicotine dependence, cigarettes, uncomplicated: Secondary | ICD-10-CM | POA: Insufficient documentation

## 2020-01-01 DIAGNOSIS — Z79899 Other long term (current) drug therapy: Secondary | ICD-10-CM | POA: Insufficient documentation

## 2020-01-01 DIAGNOSIS — I10 Essential (primary) hypertension: Secondary | ICD-10-CM | POA: Insufficient documentation

## 2020-01-01 DIAGNOSIS — R0602 Shortness of breath: Secondary | ICD-10-CM | POA: Insufficient documentation

## 2020-01-01 DIAGNOSIS — E119 Type 2 diabetes mellitus without complications: Secondary | ICD-10-CM | POA: Insufficient documentation

## 2020-01-01 LAB — COMPREHENSIVE METABOLIC PANEL
ALT: 27 U/L (ref 0–44)
AST: 27 U/L (ref 15–41)
Albumin: 4 g/dL (ref 3.5–5.0)
Alkaline Phosphatase: 75 U/L (ref 38–126)
Anion gap: 11 (ref 5–15)
BUN: 12 mg/dL (ref 6–20)
CO2: 25 mmol/L (ref 22–32)
Calcium: 9.5 mg/dL (ref 8.9–10.3)
Chloride: 101 mmol/L (ref 98–111)
Creatinine, Ser: 0.9 mg/dL (ref 0.61–1.24)
GFR calc Af Amer: >60 mL/min (ref >60)
GFR calc non Af Amer: >60 mL/min (ref >60)
Glucose, Bld: 120 mg/dL — ABNORMAL HIGH (ref 70–99)
Potassium: 3.8 mmol/L (ref 3.5–5.1)
Sodium: 137 mmol/L (ref 135–145)
Total Bilirubin: 0.9 mg/dL (ref 0.3–1.2)
Total Protein: 6.9 g/dL (ref 6.5–8.1)

## 2020-01-01 LAB — TROPONIN I (HIGH SENSITIVITY)
Troponin I (High Sensitivity): 5 ng/L (ref <18)
Troponin I (High Sensitivity): 6 ng/L (ref <18)

## 2020-01-01 LAB — URINALYSIS, ROUTINE W REFLEX MICROSCOPIC
Bilirubin Urine: NEGATIVE
Glucose, UA: 50 mg/dL — AB
Hgb urine dipstick: NEGATIVE
Ketones, ur: NEGATIVE mg/dL
Leukocytes,Ua: NEGATIVE
Nitrite: NEGATIVE
Protein, ur: NEGATIVE mg/dL
Specific Gravity, Urine: 1.015 (ref 1.005–1.030)
pH: 6 (ref 5.0–8.0)

## 2020-01-01 LAB — CBG MONITORING, ED: Glucose-Capillary: 169 mg/dL — ABNORMAL HIGH (ref 70–99)

## 2020-01-01 NOTE — ED Triage Notes (Signed)
To ED via EMS for eval of SOB while sitting in class. States he felt hot and then sob started. No complaints now. EMS states they noticed pt was diaphoretic on their arrival. Speaks in full clear sentences. Appears in nad.

## 2020-01-01 NOTE — ED Provider Notes (Signed)
[facility] EMERGENCY DEPARTMENT Provider Note   CSN: [id number] Arrival date & time: 01/01/20  1238    History Chief Complaint  Patient presents with  . Shortness of Breath    Alexander Powers is a 54 y.o. male with past medical history significant for diabetes, hypertension who presents for evaluation of shortness of breath.  Patient was sitting in class earlier today.  States the [facility] was not working.  [place] Spaniel he got into an argument with a classmate.  Said he had approximately 2-3-minute episode of shortness of breath.  No chest pain.  Patient states he had a ready been sweating due to [facility] not working.  He denies any syncope.  No headache, lightheadedness, dizziness, chest pain, hemoptysis, abdominal pain, diarrhea, dysuria, weakness, lateral leg swelling, redness, warmth.  No PND orthopnea.  No recent surgery, mobilization, history of PE or DVT.  He denies any exertional or pleuritic chest pain.  Patient states he has not had any pain since arrival to the ED.  Patient arrived via EMS approximately 11 hours ago.  On my initial evaluation patient is requesting discharge home.  States he only needed to be seen so he can return back to [place].  He does not want any further work-up here in the emergency department.  Denies additional aggravating or relieving factors.  No cough, rhinorrhea, denies known Covid exposures  History obtained from patient and past medical records.  No interpreter was used.  HPI     Past Medical History:  Diagnosis Date  . Diabetes mellitus without complication ([facility])   . Difficult intubation   . GERD (gastroesophageal reflux disease)    OCC- TUMS  . Glaucoma   . Hypercholesteremia   . Hypertension   . Strep throat    01/24/18     Patient Active Problem List   Diagnosis Date Noted  . 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 Right groin hernia 08/03/2019  . Vitamin D deficiency 12/15/2018  . DM2 (diabetes mellitus, type 2) (HCC) 04/21/2018  . HTN  (hypertension) 04/21/2018  . HLD (hyperlipidemia) 04/21/2018  . Glaucoma 04/21/2018  . Umbilical hernia without obstruction or gangrene     Past Surgical History:  Procedure Laterality Date  . HERNIA REPAIR    . INSERTION OF MESH N/A 10/22/2017   Procedure: INSERTION OF MESH;  Surgeon: Ancil Linsey, MD;  Location: [facility] ORS;  Service: General;  Laterality: N/A;  . UMBILICAL HERNIA REPAIR N/A 10/22/2017   Procedure: HERNIA REPAIR UMBILICAL ADULT WITH MESH;  Surgeon: Ancil Linsey, MD;  Location: [facility] ORS;  Service: General;  Laterality: N/A;       Family History  Problem Relation Age of Onset  . Diabetes Father   . Hyperlipidemia Father   . Hypertension Father   . Kidney disease Father   . Diabetes Paternal Uncle     Social History   Tobacco Use  . Smoking status: Current Every Day Smoker    Packs/day: 0.50    Years: 27.00    Pack years: 13.50    Types: Cigarettes  . Smokeless tobacco: Never Used  . Tobacco comment: age [age] 1/2 ppd quit age 89   Substance Use Topics  . Alcohol use: Yes    Comment: occasionally  . Drug use: Yes    Types: Marijuana    Home Medications Prior to Admission medications   Medication Sig Start Date End Date Taking? Authorizing Provider  acetaminophen (TYLENOL) 500 MG tablet Take 1 tablet (500 mg  total) by mouth every 4 (four) hours as needed. 03/22/19   McLean-Scocuzza, Pasty Spillers, MD  albuterol (VENTOLIN HFA) 108 (90 Base) MCG/ACT inhaler Inhale 1-2 puffs into the lungs every 4 (four) hours as needed for wheezing or shortness of breath. 07/04/19   McLean-Scocuzza, Pasty Spillers, MD  amLODipine (NORVASC) 2.5 MG tablet Take 1 tablet (2.5 mg total) by mouth daily. 07/04/19   McLean-Scocuzza, Pasty Spillers, MD  atorvastatin (LIPITOR) 40 MG tablet Take 1 tablet (40 mg total) by mouth daily at 6 PM. 07/04/19   McLean-Scocuzza, Pasty Spillers, MD  cetirizine (ZYRTEC) 10 MG tablet Take 1 tablet (10 mg total) by mouth daily as needed for allergies. 07/04/19    McLean-Scocuzza, Pasty Spillers, MD  Cholecalciferol 1.25 MG (50000 UT) capsule Take 1 capsule (50,000 Units total) by mouth once a week. 12/15/18   McLean-Scocuzza, Pasty Spillers, MD  Dextromethorphan-guaiFENesin (MUCINEX DM MAXIMUM STRENGTH) 60-1200 MG TB12 Take 1 tablet by mouth 2 (two) times daily as needed. 07/04/19   McLean-Scocuzza, Pasty Spillers, MD  diclofenac sodium (VOLTAREN) 1 % GEL Apply 2 g topically 4 (four) times daily. 12/14/18   McLean-Scocuzza, Pasty Spillers, MD  latanoprost (XALATAN) 0.005 % ophthalmic solution INSTILL 1 DROP INTO EACH EYE NIGHTLY 03/14/18   [provider]  losartan-hydrochlorothiazide (HYZAAR) 100-25 MG tablet Take 1 tablet by mouth daily. 07/04/19   McLean-Scocuzza, Pasty Spillers, MD  meloxicam (MOBIC) 15 MG tablet Take 1 tablet (15 mg total) by mouth daily as needed for pain. Patient not taking: Reported on 11/17/2019 11/29/18   Tommie Sams, DO  oxyCODONE-acetaminophen (PERCOCET) 5-325 MG tablet Take 1 tablet by mouth 2 (two) times daily as needed for severe pain. 11/17/19   McLean-Scocuzza, Pasty Spillers, MD  predniSONE (DELTASONE) 20 MG tablet Take 1 tablet (20 mg total) by mouth daily with breakfast. Patient not taking: Reported on 11/17/2019 08/02/19   McLean-Scocuzza, Pasty Spillers, MD  Semaglutide,0.25 or 0.5MG /DOS, 2 MG/1.5ML SOPN Inject 0.5 mg into the skin every 7 (seven) days. 07/04/19   McLean-Scocuzza, Pasty Spillers, MD    Allergies    Patient has no known allergies.  Review of Systems   Review of Systems  Constitutional: Negative.   HENT: Negative.   Respiratory: Positive for shortness of breath (None currently). Negative for apnea, cough, choking, chest tightness, wheezing and stridor.   Cardiovascular: Negative.   Gastrointestinal: Negative.   Genitourinary: Negative.   Musculoskeletal: Negative.   Skin: Negative.   Neurological: Negative.   All other systems reviewed and are negative.   Physical Exam Updated Vital Signs BP 136/80 (BP Location: Left Arm)   Pulse 80   Temp 99  F (37.2 C) (Oral)   Resp 15   Ht 5\' 8"  (1.727 m)   Wt 86.2 kg   SpO2 100%   BMI 28.89 kg/m   Physical Exam Vitals and nursing note reviewed.  Constitutional:      General: He is not in acute distress.    Appearance: He is not ill-appearing, toxic-appearing or diaphoretic.  HENT:     Head: Normocephalic and atraumatic.     Jaw: There is normal jaw occlusion.     Nose: Nose normal.     Mouth/Throat:     Mouth: Mucous membranes are moist.  Eyes:     Extraocular Movements: Extraocular movements intact.  Neck:     Vascular: No carotid bruit or JVD.     Trachea: Trachea and phonation normal.     Meningeal: Brudzinski's sign and Kernig's sign absent.  Cardiovascular:     Rate and Rhythm: Normal rate.     Pulses: Normal pulses.          Radial pulses are 2+ on the right side and 2+ on the left side.       Posterior tibial pulses are 2+ on the right side and 2+ on the left side.     Heart sounds: Normal heart sounds.  Pulmonary:     Effort: Pulmonary effort is normal.     Breath sounds: Normal breath sounds and air entry.     Comments: Clear to auscultation bilateral without wheeze, rhonchi or rales speaks in full sentences without difficulty Chest:     Chest wall: No deformity, swelling, tenderness, crepitus or edema.  Abdominal:     General: Bowel sounds are normal.     Palpations: Abdomen is soft.  Musculoskeletal:        General: Normal range of motion.     Cervical back: Full passive range of motion without pain, normal range of motion and neck supple.     Right lower leg: No tenderness. No edema.     Left lower leg: No tenderness. No edema.     Comments: Moves all 4 extremities without difficulty.  Denna HaggardHomans' sign negative.  Feet:     Right foot:     Skin integrity: Skin integrity normal.     Left foot:     Skin integrity: Skin integrity normal.  Skin:    General: Skin is warm.     Capillary Refill: Capillary refill takes less than 2 seconds.     Comments: No edema,  erythema or warmth.  Tactile temperature to extremities.  Neurological:     General: No focal deficit present.     Mental Status: He is alert and oriented to person, place, and time.     Comments: Ambulatory in ED without difficulty Cranial nerves II through XII grossly intact.    ED Results / Procedures / Treatments   Labs (all labs ordered are listed, but only abnormal results are displayed) Labs Reviewed  COMPREHENSIVE METABOLIC PANEL - Abnormal; Notable for the following components:      Result Value   Glucose, Bld 120 (*)    All other components within normal limits  URINALYSIS, ROUTINE W REFLEX MICROSCOPIC - Abnormal; Notable for the following components:   Glucose, UA 50 (*)    All other components within normal limits  CBG MONITORING, ED - Abnormal; Notable for the following components:   Glucose-Capillary 169 (*)    All other components within normal limits  TROPONIN I (HIGH SENSITIVITY)  TROPONIN I (HIGH SENSITIVITY)    EKG EKG Interpretation  Date/Time:  Monday Jan 01 2020 12:44:46 EDT Ventricular Rate:  77 PR Interval:  162 QRS Duration: 86 QT Interval:  356 QTC Calculation: 402 R Axis:   61 Text Interpretation: Normal sinus rhythm Possible Left atrial enlargement Borderline ECG No significant change since last tracing Confirmed by Drema Pryardama, Pedro [phone]([id number]) on 01/01/2020 10:57:36 PM   Radiology DG Chest 2 View  Result Date: 01/01/2020 CLINICAL DATA:  Fatigue, shortness of breath, diaphoresis, history diabetes mellitus and hypertension EXAM: CHEST - 2 VIEW COMPARISON:  10/19/2017 FINDINGS: Chronic eventration LEFT diaphragm. Normal heart size, mediastinal contours, and pulmonary vascularity. Atherosclerotic calcification aorta. Minimal residual LEFT basilar atelectasis, improved. Lungs otherwise clear. No pulmonary infiltrate, pleural effusion or pneumothorax. Osseous structures unremarkable. IMPRESSION: Improved LEFT basilar atelectasis. No new abnormalities.  Electronically Signed   By: Ulyses SouthwardMark  Boles  M.D.   On: 01/01/2020 13:15    Procedures Procedures (including critical care time)  Medications Ordered in ED Medications - No data to display  ED Course  I have reviewed the triage vital signs and the nursing notes.  Pertinent labs & imaging results that were available during my care of the patient were reviewed by me and considered in my medical decision making (see chart for details).  54 year old male presents for episode of shortness of breath.  He is afebrile, nonseptic, non-ill-appearing.  No cough, lateral leg swelling, redness, warmth.  He is without tachycardia, tachypnea or hypoxia.  He is PERC negative.  No risk factors for PE or DVT.  He has no exertional or pleuritic chest pain.  Shortness of breath lasted approximately 2 to 3 minutes.  He has been in the ED for greater than 12 hours without any symptoms.  Patient states he is only here to "get my letter to say I can return back to [place]."  He does not want any further work-up here in the emergency department.  Heart score 3.    Labs obtained from triage which were personally reviewed interpreted. Delta troponin flat, 5>>6 Metabolic panel with hyperglycemia 120 Urinalysis negative for infection EKG negative for ischemic changes Plain film with improved left basilar atelectasis.  No cardiomegaly, pulmonary edema, pneumothorax   Unfortunately triage did not obtain CBC.  I discussed obtaining this with patient.  Patient states he does not want any additional labs at this time.  Low suspicion for anemia or infectious process as cause of his shortness of breath due to fleeting symptoms.  Patient appears otherwise well.  He is ambulatory with any hypoxia.  Discussed return for any new or worsening symptoms.  Patient is to be discharged with recommendation to follow up with PCP in regards to today's hospital visit. Chest pain is not likely of cardiac or pulmonary etiology d/t presentation,  PERC negative, VSS, no tracheal deviation, no JVD or new murmur, RRR, breath sounds equal bilaterally, EKG without acute abnormalities, negative troponin, and negative CXR. Pt has been advised to return to the ED if CP becomes exertional, associated with diaphoresis or nausea, radiates to left jaw/arm, worsens or becomes concerning in any way. Pt appears reliable for follow up and is agreeable to discharge.   The patient has been appropriately medically screened and/or stabilized in the ED. I have low suspicion for any other emergent medical condition which would require further screening, evaluation or treatment in the ED or require inpatient management.  Patient is hemodynamically stable and in no acute distress.  Patient able to ambulate in department prior to ED.  Evaluation does not show acute pathology that would require ongoing or additional emergent interventions while in the emergency department or further inpatient treatment.  I have discussed the diagnosis with the patient and answered all questions.  Pain is been managed while in the emergency department and patient has no further complaints prior to discharge.  Patient is comfortable with plan discussed in room and is stable for discharge at this time.  I have discussed strict return precautions for returning to the emergency department.  Patient was encouraged to follow-up with PCP/specialist refer to at discharge.     MDM Rules/Calculators/A&P         HEART Score: 3              Final Clinical Impression(s) / ED Diagnoses Final diagnoses:  SOB (shortness of breath)    Rx / DC  Orders ED Discharge Orders    None       Latunya Kissick A, PA-C 01/01/20 [id number]    Nira Conn, MD 01/02/20 [phone]

## 2020-01-01 NOTE — Discharge Instructions (Signed)
Return for new or worsening symptoms

## 2020-01-02 ENCOUNTER — Other Ambulatory Visit: Payer: Self-pay

## 2020-01-09 ENCOUNTER — Other Ambulatory Visit: Payer: Self-pay | Admitting: Internal Medicine

## 2020-01-09 ENCOUNTER — Telehealth: Payer: Self-pay

## 2020-01-09 DIAGNOSIS — E785 Hyperlipidemia, unspecified: Secondary | ICD-10-CM

## 2020-01-09 MED ORDER — ROSUVASTATIN CALCIUM 10 MG PO TABS: 10.0000 mg | ORAL_TABLET | Freq: Every day | ORAL | 3 refills | Status: DC

## 2020-01-09 NOTE — Telephone Encounter (Signed)
Patient was informed of results.  Patient understood and no questions, comments, or concerns at this time. He is currently not taking Lipitor due to it causing him to feel nauseous. He is open to another medication.

## 2020-01-09 NOTE — Telephone Encounter (Signed)
Sent crestor 10 mg to medication management clinic in [place] Stop lipitor 40    He needs cholesterol medication though   TMS

## 2020-01-10 NOTE — Telephone Encounter (Signed)
Patient informed and verbalized understanding

## 2020-01-15 ENCOUNTER — Encounter: Payer: Self-pay | Admitting: Emergency Medicine

## 2020-01-15 ENCOUNTER — Other Ambulatory Visit: Payer: Self-pay

## 2020-01-15 DIAGNOSIS — Z5321 Procedure and treatment not carried out due to patient leaving prior to being seen by health care provider: Secondary | ICD-10-CM | POA: Insufficient documentation

## 2020-01-15 DIAGNOSIS — H9209 Otalgia, unspecified ear: Secondary | ICD-10-CM | POA: Diagnosis present

## 2020-01-15 NOTE — ED Triage Notes (Signed)
Pt presents to ED with left ear pain for the past 2 - 3 days. Denies drainage. Also reports nasal congestion and states it feels like it is coming from that side.

## 2020-01-16 ENCOUNTER — Emergency Department
Admission: EM | Admit: 2020-01-16 | Discharge: 2020-01-16 | Disposition: A | Discharge status: Home / Self Care | Payer: 59 | Attending: Emergency Medicine | Admitting: Emergency Medicine

## 2020-01-16 NOTE — ED Notes (Signed)
No answer when called several times from lobby 

## 2020-01-19 ENCOUNTER — Telehealth (INDEPENDENT_AMBULATORY_CARE_PROVIDER_SITE_OTHER): Payer: 59 | Admitting: Internal Medicine

## 2020-01-19 ENCOUNTER — Encounter: Payer: Self-pay | Admitting: Internal Medicine

## 2020-01-19 ENCOUNTER — Telehealth: Payer: Self-pay | Admitting: Internal Medicine

## 2020-01-19 VITALS — Ht 68.0 in | Wt 192.0 lb

## 2020-01-19 DIAGNOSIS — M26622 Arthralgia of left temporomandibular joint: Secondary | ICD-10-CM

## 2020-01-19 DIAGNOSIS — S0300XA Dislocation of jaw, unspecified side, initial encounter: Secondary | ICD-10-CM

## 2020-01-19 DIAGNOSIS — H9202 Otalgia, left ear: Secondary | ICD-10-CM

## 2020-01-19 MED ORDER — AMOXICILLIN-POT CLAVULANATE 875-125 MG PO TABS: 1.0000 | ORAL_TABLET | Freq: Two times a day (BID) | ORAL | 0 refills | Status: DC

## 2020-01-19 MED ORDER — CYCLOBENZAPRINE HCL 5 MG PO TABS: 5.0000 mg | ORAL_TABLET | Freq: Every evening | ORAL | 2 refills | Status: DC | PRN

## 2020-01-19 NOTE — Telephone Encounter (Signed)
Patient informed and verbalized understanding

## 2020-01-19 NOTE — Telephone Encounter (Signed)
This is a medication he should get from his eye doctor at [facility] he has to call this clinic for this med for glaucoma and schedule f/u with them   TMS

## 2020-01-19 NOTE — Progress Notes (Signed)
telephone Note  I connected with Alexander Powers  on 01/19/20 at 11:30 AM EDT by a video enabled telemedicine application and verified that I am speaking with the correct person using two identifiers.  Location patient: car Location provider:work or home office Persons participating in the virtual visit: patient, provider, SO Alexander Powers  I discussed the limitations of evaluation and management by telemedicine and the availability of in person appointments. The patient expressed understanding and agreed to proceed.   HPI: 1. C/o left 7/10 jaw pain and left ear pain pain worse with chewing and worse in the am and pm nothing tried. He does have allergies and takes otc allergy pill has not f/u dental recently dental insurance will take 30 days to activate    ROS: See pertinent positives and negatives per HPI.  Past Medical History:  Diagnosis Date  . Diabetes mellitus without complication (HCC)   . Difficult intubation   . GERD (gastroesophageal reflux disease)    OCC- TUMS  . Glaucoma   . Hypercholesteremia   . Hypertension   . Strep throat    01/24/18     Past Surgical History:  Procedure Laterality Date  . HERNIA REPAIR    . INSERTION OF MESH N/A 10/22/2017   Procedure: INSERTION OF MESH;  Surgeon: Ancil Linsey, MD;  Location: [facility] ORS;  Service: General;  Laterality: N/A;  . UMBILICAL HERNIA REPAIR N/A 10/22/2017   Procedure: HERNIA REPAIR UMBILICAL ADULT WITH MESH;  Surgeon: Ancil Linsey, MD;  Location: [facility] ORS;  Service: General;  Laterality: N/A;    Family History  Problem Relation Age of Onset  . Diabetes Father   . Hyperlipidemia Father   . Hypertension Father   . Kidney disease Father   . Diabetes Paternal Uncle     SOCIAL HX: lives with Alexander Powers   Current Outpatient Medications:  .  acetaminophen (TYLENOL) 500 MG tablet, Take 1 tablet (500 mg total) by mouth every 4 (four) hours as needed., Disp: 90 tablet, Rfl: 3 .  albuterol (VENTOLIN HFA) 108 (90  Base) MCG/ACT inhaler, Inhale 1-2 puffs into the lungs every 4 (four) hours as needed for wheezing or shortness of breath., Disp: 18 g, Rfl: 2 .  amLODipine (NORVASC) 2.5 MG tablet, Take 1 tablet (2.5 mg total) by mouth daily., Disp: 90 tablet, Rfl: 3 .  cetirizine (ZYRTEC) 10 MG tablet, Take 1 tablet (10 mg total) by mouth daily as needed for allergies., Disp: 90 tablet, Rfl: 3 .  Cholecalciferol 1.25 MG (50000 UT) capsule, Take 1 capsule (50,000 Units total) by mouth once a week., Disp: 13 capsule, Rfl: 1 .  diclofenac sodium (VOLTAREN) 1 % GEL, Apply 2 g topically 4 (four) times daily., Disp: 100 g, Rfl: 12 .  losartan-hydrochlorothiazide (HYZAAR) 100-25 MG tablet, Take 1 tablet by mouth daily., Disp: 90 tablet, Rfl: 3 .  meloxicam (MOBIC) 15 MG tablet, Take 1 tablet (15 mg total) by mouth daily as needed for pain., Disp: 30 tablet, Rfl: 1 .  rosuvastatin (CRESTOR) 10 MG tablet, Take 1 tablet (10 mg total) by mouth daily., Disp: 90 tablet, Rfl: 3 .  Semaglutide,0.25 or 0.5MG /DOS, 2 MG/1.5ML SOPN, Inject 0.5 mg into the skin every 7 (seven) days., Disp: 5 pen, Rfl: 12 .  amoxicillin-clavulanate (AUGMENTIN) 875-125 MG tablet, Take 1 tablet by mouth 2 (two) times daily. With food, Disp: 14 tablet, Rfl: 0 .  cyclobenzaprine (FLEXERIL) 5 MG tablet, Take 1-2 tablets (5-10 mg total) by mouth at bedtime as needed  for muscle spasms., Disp: 60 tablet, Rfl: 2 .  Dextromethorphan-guaiFENesin (MUCINEX DM MAXIMUM STRENGTH) 60-1200 MG TB12, Take 1 tablet by mouth 2 (two) times daily as needed. (Patient not taking: Reported on 01/19/2020), Disp: 40 tablet, Rfl: 0 .  latanoprost (XALATAN) 0.005 % ophthalmic solution, INSTILL 1 DROP INTO EACH EYE NIGHTLY (Patient not taking: Reported on 01/19/2020), Disp: , Rfl: 12 .  oxyCODONE-acetaminophen (PERCOCET) 5-325 MG tablet, Take 1 tablet by mouth 2 (two) times daily as needed for severe pain. (Patient not taking: Reported on 01/19/2020), Disp: 10 tablet, Rfl:  0  EXAM:  VITALS per patient if applicable:  GENERAL: alert, oriented, appears well and in no acute distress  PSYCH/NEURO: pleasant and cooperative, no obvious depression or anxiety, speech and thought processing grossly intact  ASSESSMENT AND PLAN:  Discussed the following assessment and plan:  Dislocation of temporomandibular joint, initial encounter ? TMJ left vs poor dentition vs otitis media - Plan: cyclobenzaprine (FLEXERIL) 5-10 MG tablet qhs prn Plan: amoxicillin-clavulanate (AUGMENTIN) 875-125 MG tablet Establish with dental when insurance activated may need mouth guard  Will mail exercises today   -we discussed possible serious and likely etiologies, options for evaluation and workup, limitations of telemedicine visit vs in person visit, treatment, treatment risks and precautions. Pt prefers to treat via telemedicine empirically rather then risking or undertaking an in person visit at this moment. Patient agrees to seek prompt in person care if worsening, new symptoms arise, or if is not improving with treatment.   I discussed the assessment and treatment plan with the patient. The patient was provided an opportunity to ask questions and all were answered. The patient agreed with the plan and demonstrated an understanding of the instructions.   The patient was advised to call back or seek an in-person evaluation if the symptoms worsen or if the condition fails to improve as anticipated.  20 min Alexander Jackson, MD

## 2020-01-19 NOTE — Patient Instructions (Addendum)
Also see your dentist if you are grinding your teeth You may need a mouthpiece   Temporomandibular Joint Syndrome  Temporomandibular joint syndrome (TMJ syndrome) is a condition that causes pain in the temporomandibular joints. These joints are located near your ears and allow your jaw to open and close. For people with TMJ syndrome, chewing, biting, or other movements of the jaw can be difficult or painful. TMJ syndrome is often mild and goes away within a few weeks. However, sometimes the condition becomes a long-term (chronic) problem. What are the causes? This condition may be caused by:  Grinding your teeth or clenching your jaw. Some people do this when they are under stress.  Arthritis.  Injury to the jaw.  Head or neck injury.  Teeth or dentures that are not aligned well. In some cases, the cause of TMJ syndrome may not be known. What are the signs or symptoms? The most common symptom of this condition is an aching pain on the side of the head in the area of the TMJ. Other symptoms may include:  Pain when moving your jaw, such as when chewing or biting.  Being unable to open your jaw all the way.  Making a clicking sound when you open your mouth.  Headache.  Earache.  Neck or shoulder pain. How is this diagnosed? This condition may be diagnosed based on:  Your symptoms and medical history.  A physical exam. Your health care provider may check the range of motion of your jaw.  Imaging tests, such as X-rays or an MRI. You may also need to see your dentist, who will determine if your teeth and jaw are lined up correctly. How is this treated? TMJ syndrome often goes away on its own. If treatment is needed, the options may include:  Eating soft foods and applying ice or heat.  Medicines to relieve pain or inflammation.  Medicines or massage to relax the muscles.  A splint, bite plate, or mouthpiece to prevent teeth grinding or jaw clenching.  Relaxation  techniques or counseling to help reduce stress.  A therapy for pain in which an electrical current is applied to the nerves through the skin (transcutaneous electrical nerve stimulation).  Acupuncture. This is sometimes helpful to relieve pain.  Jaw surgery. This is rarely needed. Follow these instructions at home:  Eating and drinking  Eat a soft diet if you are having trouble chewing.  Avoid foods that require a lot of chewing. Do not chew gum. General instructions  Take over-the-counter and prescription medicines only as told by your health care provider.  If directed, put ice on the painful area. ? Put ice in a plastic bag. ? Place a towel between your skin and the bag. ? Leave the ice on for 20 minutes, 2-3 times a day.  Apply a warm, wet cloth (warm compress) to the painful area as directed.  Massage your jaw area and do any jaw stretching exercises as told by your health care provider.  If you were given a splint, bite plate, or mouthpiece, wear it as told by your health care provider.  Keep all follow-up visits as told by your health care provider. This is important. Contact a health care provider if:  You are having trouble eating.  You have new or worsening symptoms. Get help right away if:  Your jaw locks open or closed. Summary  Temporomandibular joint syndrome (TMJ syndrome) is a condition that causes pain in the temporomandibular joints. These joints are located near  your ears and allow your jaw to open and close.  TMJ syndrome is often mild and goes away within a few weeks. However, sometimes the condition becomes a long-term (chronic) problem.  Symptoms include an aching pain on the side of the head in the area of the TMJ, pain when chewing or biting, and being unable to open your jaw all the way. You may also make a clicking sound when you open your mouth.  TMJ syndrome often goes away on its own. If treatment is needed, it may include medicines to  relieve pain, reduce inflammation, or relax the muscles. A splint, bite plate, or mouthpiece may also be used to prevent teeth grinding or jaw clenching. This information is not intended to replace advice given to you by your health care provider. Make sure you discuss any questions you have with your health care provider. Document Revised: 10/08/2017 Document Reviewed: 09/07/2017 Elsevier Patient Education  2020 Elsevier Inc.  Jaw Range of Motion Exercises Jaw range of motion exercises are exercises that help your jaw move better. Exercises that help you have good posture (postural exercises) also help relieve jaw discomfort. These are often done along with range of motion exercises. These exercises can help prevent or improve:  Difficulty opening your mouth.  Pain in your jaw while it is open or closed.  Temporomandibular joint (TMJ) pain.  Headache caused by jaw tension. Take other actions to prevent or relieve jaw pain, such as:  Avoiding things that cause or increase jaw pain. This may include: ? Chewing gum or eating hard foods. ? Clenching your jaw or teeth, grinding your teeth, or keeping tension in your jaw muscles. ? Opening your mouth wide, such as for a big yawn. ? Leaning on your jaw, such as resting your jaw in your hand while leaning on a desk.  Putting ice on your jaw. ? Put ice in a plastic bag. ? Place a towel between your skin and the bag. ? Leave the ice on for 10-15 minutes, 2-3 times a day. Only do jaw exercises that your health care provider approves of. Only move your jaw as far as it can comfortably go in each direction. Do not move your jaw into positions that cause pain. Range of motion exercises Repeat each of these exercises 8 times, 1-2 times a day, or as told by your health care provider. Exercise A: Forward protrusion 1. Push your jaw forward. Hold this position for 1-2 seconds. 2. Allow your jaw to return to its normal position and rest it there for  1-2 seconds. Exercise B: Controlled opening 1. Stand or sit in front of a mirror. Place your tongue on the roof of your mouth, just behind your top teeth. 2. Keeping your tongue on the roof of your mouth, slowly open and close your mouth. 3. While you open and close your mouth, watch your jaw in the mirror. Try to keep your jaw from moving to one side or the other. Exercise C: Right and left motion 1. Move your jaw right. Hold this position for 1-2 seconds. Allow your jaw to return to its normal position, and rest it there for 1-2 seconds. 2. Move your jaw left. Hold this position for 1-2 seconds. Allow your jaw to return to its normal position, and rest it there for 1-2 seconds. Postural exercises Exercise A: Chin tucks 1. You can do this exercise sitting, standing, or lying down. 2. Move your head straight back, keeping your head level. You can  guide the movement by placing your fingers on your chin to push your jaw back in an even motion. You should be able to feel a double chin form at the end of the motion. 3. Hold this position for 5 seconds. Repeat 10-15 times. Exercise B: Shoulder blade squeeze 1. Sit or stand. 2. Bend your elbows to about 90 degrees, which is the shape of a capital letter "L." Keep your upper arms by your body. 3. Squeeze your shoulder blades down and back, as though you were trying to touch your elbows behind you. Do not shrug your shoulders or move your head. 4. Hold this position for 5 seconds. Repeat 10-15 times. Exercise C: Chest stretch 1. Stand facing a corner. 2. Put both of your hands and your forearms on the wall, with your arms wide apart. 3. Make sure your arms are at a 90-degree angle to your body. This means that you should hold your arms straight out from your body, level with the floor. 4. Step in toward the corner. Do not lean in. 5. Hold this position for 30 seconds. Repeat 3 times. Contact a health care provider if you have:  Jaw pain that is new  or gets worse.  Clicking or popping sounds while doing the exercises. Get help right away if:  Your jaw is stuck in one place and you cannot move it.  You cannot open or close your mouth. This information is not intended to replace advice given to you by your health care provider. Make sure you discuss any questions you have with your health care provider. Document Revised: 11/18/2018 Document Reviewed: 06/23/2017 Elsevier Patient Education  [employer].

## 2020-01-19 NOTE — Telephone Encounter (Signed)
Pt needs refill on latanoprost (XALATAN) 0.005 % ophthalmic solution sent to [facility]

## 2020-01-30 ENCOUNTER — Telehealth: Payer: Self-pay | Admitting: Internal Medicine

## 2020-01-30 NOTE — Telephone Encounter (Signed)
Pt no show appt 01/01/2020 @ 10:45am  note from Orthopedic surgery

## 2020-02-16 ENCOUNTER — Ambulatory Visit: Payer: Self-pay | Admitting: Internal Medicine

## 2020-03-05 ENCOUNTER — Telehealth: Payer: Self-pay | Admitting: Internal Medicine

## 2020-03-05 DIAGNOSIS — E1165 Type 2 diabetes mellitus with hyperglycemia: Secondary | ICD-10-CM

## 2020-03-05 MED ORDER — SEMAGLUTIDE(0.25 OR 0.5MG/DOS) 2 MG/1.5ML ~~LOC~~ SOPN: 0.5000 mg | PEN_INJECTOR | SUBCUTANEOUS | 12 refills | Status: DC

## 2020-03-05 NOTE — Telephone Encounter (Signed)
Pt needs a refill on Semaglutide,0.25 or 0.5MG /DOS, 2 MG/1.5ML SOPN Sent to [facility]

## 2020-03-08 ENCOUNTER — Other Ambulatory Visit: Payer: Self-pay

## 2020-03-08 ENCOUNTER — Telehealth: Payer: Self-pay | Admitting: Pharmacist

## 2020-03-08 DIAGNOSIS — E1165 Type 2 diabetes mellitus with hyperglycemia: Secondary | ICD-10-CM

## 2020-03-08 MED ORDER — SEMAGLUTIDE(0.25 OR 0.5MG/DOS) 2 MG/1.5ML ~~LOC~~ SOPN: 0.5000 mg | PEN_INJECTOR | SUBCUTANEOUS | 12 refills | Status: DC

## 2020-03-08 NOTE — Telephone Encounter (Signed)
03/08/2020 9:20:36 AM - Call to [facility] on Ozempic  -- [name] - Friday, March 08, 2020 9:18 AM --[employer] spoke with Lurena Joiner, she verified patient address due to Lot #1, and was able to process the application, Patient approved till 03/03/2021, allow 10-14 business days to receive medication. Also they are currently experiencing delay in shipment due to the volume.

## 2020-03-21 ENCOUNTER — Ambulatory Visit: Payer: Self-pay | Admitting: Internal Medicine

## 2020-03-22 ENCOUNTER — Ambulatory Visit: Payer: Self-pay | Admitting: Internal Medicine

## 2020-03-26 ENCOUNTER — Telehealth: Payer: Self-pay | Admitting: Internal Medicine

## 2020-03-26 NOTE — Telephone Encounter (Signed)
Received 5 boxes of Ozempic form patient assistance from [employer].   Called and informed the Patient that this is available for pick-up. Patient verbalized understanding and will come today to pick this up.   Boxes all labeled with patient name, DOB, MRN, medication, and how to take medication (signature).

## 2020-04-01 ENCOUNTER — Telehealth: Payer: Self-pay

## 2020-04-03 ENCOUNTER — Telehealth: Payer: Self-pay | Admitting: Pharmacist

## 2020-04-03 NOTE — Telephone Encounter (Signed)
04/03/2020 8:59:57 AM - Call to [facility] on Ozempic  -- [name] - Wednesday, April 03, 2020 8:55 AM --[employer] to check on [place] with [name] reviewed and this was processed and shipped 03/25/2020, delivered 03/26/2020 signed by [place]. I then called [name] in [employer] to verify if we had employee by that name, he stated No. I then called provider office-Dr. [name] @ [facility] [address]  [phone] spoke with Morrie Sheldon, she was able to verify they did receive 5 boxes of Ozempic and patient picked the medication up on 03/26/2020. I have made an invoice for the next order due in Dec.

## 2020-04-03 NOTE — Telephone Encounter (Signed)
TB RN received IJN forms from [facility].  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 [place]) in  May 2021 and chest x-ray without Active TB 12/26/19. Paperwork scanned into media.  TC from patient. 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  Can't remember the year but remembers taking treatment. Denies sx's of TB. Patient states tried to tell [facility] that he had a hx of +PPDs but they still placed PPD.  No further TB f/u at this time. Richmond Campbell, RN

## 2020-05-21 ENCOUNTER — Other Ambulatory Visit: Payer: Self-pay

## 2020-05-21 ENCOUNTER — Encounter: Payer: Self-pay | Admitting: Emergency Medicine

## 2020-05-21 ENCOUNTER — Ambulatory Visit
Admission: EM | Admit: 2020-05-21 | Discharge: 2020-05-21 | Disposition: A | Discharge status: Home / Self Care | Payer: 59 | Attending: Physician Assistant | Admitting: Physician Assistant

## 2020-05-21 DIAGNOSIS — S161XXA Strain of muscle, fascia and tendon at neck level, initial encounter: Secondary | ICD-10-CM

## 2020-05-21 DIAGNOSIS — S29019A Strain of muscle and tendon of unspecified wall of thorax, initial encounter: Secondary | ICD-10-CM | POA: Diagnosis not present

## 2020-05-21 MED ORDER — TIZANIDINE HCL 4 MG PO TABS: 4.0000 mg | ORAL_TABLET | Freq: Three times a day (TID) | ORAL | 0 refills | Status: AC | PRN

## 2020-05-21 MED ORDER — DICLOFENAC SODIUM 75 MG PO TBEC: 75.0000 mg | DELAYED_RELEASE_TABLET | Freq: Two times a day (BID) | ORAL | 0 refills | Status: AC

## 2020-05-21 NOTE — Discharge Instructions (Addendum)
NECK PAIN: Stressed avoiding painful activities. This can exacerbate your symptoms and make them worse.  May apply heat to the areas of pain for some relief. Use medications as directed. Be aware of which medications make you drowsy and do not drive or operate any kind of heavy machinery while using the medication (ie pain medications or muscle relaxers). F/U with PCP for reexamination or return sooner if condition worsens or does not begin to improve over the next few days.  ? ?NECK PAIN RED FLAGS: If symptoms get worse than they are right now, you should come back sooner for re-evaluation. If you have increased numbness/ tingling or notice that the numbness/tingling is affecting the legs or saddle region, go to ER. If you ever lose continence go to ER.     ? ?BACK PAIN: Stressed avoiding painful activities . RICE (REST, ICE, COMPRESSION, ELEVATION) guidelines reviewed. May alternate ice and heat. Consider use of muscle rubs, Salonpas patches, etc. Use medications as directed including muscle relaxers if prescribed. Take anti-inflammatory medications as prescribed or OTC NSAIDs/Tylenol.  F/u with PCP in 7-10 days for reexamination, and please feel free to call or return to the urgent care at any time for any questions or concerns you may have and we will be happy to help you!  ? ?BACK PAIN RED FLAGS: If the back pain acutely worsens or there are any red flag symptoms such as numbness/tingling, leg weakness, saddle anesthesia, or loss of bowel/bladder control, go immediately to the ER. Follow up with us as scheduled or sooner if the pain does not begin to resolve or if it worsens before the follow up   ?

## 2020-05-21 NOTE — ED Provider Notes (Signed)
[facility]    CSN: [id number] Arrival date & time: 05/21/20  1601      History   Chief Complaint Chief Complaint  Patient presents with  . Neck Pain    HPI Alexander Powers is a 54 y.o. male presenting for 3-day history of neck pain.  He says that he noticed the pain after he woke up from a nap.  Patient denies any known injury.  He says that he has to lift a lot for work and is not sure if that could have caused some the symptoms, but again denies any specific injury.  This is not a work comp related issue.  Patient admits to some radiation of pain to the left shoulder at times.  Denies any numbness or weakness.  He denies any issues with his neck in the past.  Patient has tried Tylenol, Flexeril, and icy hot without relief of symptoms.  Denies any other associated symptoms and no other complaints or concerns today.  HPI  Past Medical History:  Diagnosis Date  . Diabetes mellitus without complication (HCC)   . Difficult intubation   . GERD (gastroesophageal reflux disease)    OCC- TUMS  . Glaucoma   . Hypercholesteremia   . Hypertension   . Strep throat    01/24/18     Patient Active Problem List   Diagnosis Date Noted  . 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 Right groin hernia 08/03/2019  . Vitamin D deficiency 12/15/2018  . DM2 (diabetes mellitus, type 2) (HCC) 04/21/2018  . HTN (hypertension) 04/21/2018  . HLD (hyperlipidemia) 04/21/2018  . Glaucoma 04/21/2018  . Umbilical hernia without obstruction or gangrene     Past Surgical History:  Procedure Laterality Date  . HERNIA REPAIR    . INSERTION OF MESH N/A 10/22/2017   Procedure: INSERTION OF MESH;  Surgeon: Ancil Linsey, MD;  Location: [facility] ORS;  Service: General;  Laterality: N/A;  . UMBILICAL HERNIA REPAIR N/A 10/22/2017   Procedure: HERNIA REPAIR UMBILICAL ADULT WITH MESH;  Surgeon: Ancil Linsey, MD;  Location: [facility] OR[facility];  Service: General;  Laterality: N/A;       Home  Medications    Prior to Admission medications   Medication Sig Start Date End Date Taking? Authorizing Provider  acetaminophen (TYLENOL) 500 MG tablet Take 1 tablet (500 mg total) by mouth every 4 (four) hours as needed. 03/22/19  Yes McLean-Scocuzza, Pasty Spillers, MD  albuterol (VENTOLIN HFA) 108 (90 Base) MCG/ACT inhaler Inhale 1-2 puffs into the lungs every 4 (four) hours as needed for wheezing or shortness of breath. 07/04/19  Yes McLean-Scocuzza, Pasty Spillers, MD  amLODipine (NORVASC) 2.5 MG tablet Take 1 tablet (2.5 mg total) by mouth daily. 07/04/19  Yes McLean-Scocuzza, Pasty Spillers, MD  cetirizine (ZYRTEC) 10 MG tablet Take 1 tablet (10 mg total) by mouth daily as needed for allergies. 07/04/19  Yes McLean-Scocuzza, Pasty Spillers, MD  Cholecalciferol 1.25 MG (50000 UT) capsule Take 1 capsule (50,000 Units total) by mouth once a week. 12/15/18  Yes McLean-Scocuzza, Pasty Spillers, MD  meloxicam (MOBIC) 15 MG tablet Take 1 tablet (15 mg total) by mouth daily as needed for pain. 11/29/18  Yes Cook, Jayce G, DO  rosuvastatin (CRESTOR) 10 MG tablet Take 1 tablet (10 mg total) by mouth daily. 01/09/20  Yes McLean-Scocuzza, Pasty Spillers, MD  Semaglutide,0.25 or 0.5MG /DOS, 2 MG/1.5ML SOPN Inject 0.375 mLs (0.5 mg total) into the skin every 7 (seven) days. 03/08/20  Yes McLean-Scocuzza, Pasty Spillers, MD  amoxicillin-clavulanate (AUGMENTIN) 875-125 MG tablet Take 1 tablet by mouth 2 (two) times daily. With food 01/19/20   McLean-Scocuzza, Pasty Spillers, MD  Dextromethorphan-guaiFENesin [facility] DM MAXIMUM STRENGTH) 60-1200 MG TB12 Take 1 tablet by mouth 2 (two) times daily as needed. Patient not taking: Reported on 01/19/2020 07/04/19   McLean-Scocuzza, Pasty Spillers, MD  diclofenac (VOLTAREN) 75 MG EC tablet Take 1 tablet (75 mg total) by mouth 2 (two) times daily for 15 days. 05/21/20 06/05/20  Eusebio Friendly B, PA-C  diclofenac sodium (VOLTAREN) 1 % GEL Apply 2 g topically 4 (four) times daily. 12/14/18   McLean-Scocuzza, Pasty Spillers, MD  latanoprost (XALATAN)  0.005 % ophthalmic solution INSTILL 1 DROP INTO EACH EYE NIGHTLY Patient not taking: Reported on 01/19/2020 03/14/18   [provider]  losartan-hydrochlorothiazide (HYZAAR) 100-25 MG tablet Take 1 tablet by mouth daily. 07/04/19   McLean-Scocuzza, Pasty Spillers, MD  oxyCODONE-acetaminophen (PERCOCET) 5-325 MG tablet Take 1 tablet by mouth 2 (two) times daily as needed for severe pain. Patient not taking: Reported on 01/19/2020 11/17/19   McLean-Scocuzza, Pasty Spillers, MD  tiZANidine (ZANAFLEX) 4 MG tablet Take 1 tablet (4 mg total) by mouth every 8 (eight) hours as needed for up to 10 days for muscle spasms. 05/21/20 05/31/20  Shirlee Latch, PA-C    Family History Family History  Problem Relation Age of Onset  . Diabetes Father   . Hyperlipidemia Father   . Hypertension Father   . Kidney disease Father   . Diabetes Paternal Uncle     Social History Social History   Tobacco Use  . Smoking status: Current Every Day Smoker    Packs/day: 0.50    Years: 27.00    Pack years: 13.50    Types: Cigarettes  . Smokeless tobacco: Never Used  . Tobacco comment: age 83-37 1/2 ppd quit age 60   Vaping Use  . Vaping Use: Never used  Substance Use Topics  . Alcohol use: Yes    Comment: occasionally  . Drug use: Not Currently    Types: Marijuana     Allergies   Patient has no known allergies.   Review of Systems Review of Systems  Constitutional: Negative for fatigue and fever.  HENT: Negative for sore throat.   Respiratory: Negative for cough.   Musculoskeletal: Positive for back pain (upper), neck pain and neck stiffness. Negative for joint swelling.  Skin: Negative for color change, rash and wound.  Neurological: Negative for weakness and numbness.     Physical Exam Triage Vital Signs ED Triage Vitals  Enc Vitals Group     BP 05/21/20 0857 (!) 162/93     Pulse Rate 05/21/20 0857 73     Resp 05/21/20 0857 18     Temp 05/21/20 0857 98.1 F (36.7 C)     Temp Source 05/21/20  0857 Oral     SpO2 05/21/20 0857 98 %     Weight 05/21/20 0854 192 lb 0.3 oz (87.1 kg)     Height 05/21/20 0854 5\' 8"  (1.727 m)     Head Circumference --      Peak Flow --      Pain Score 05/21/20 0854 9     Pain Loc --      Pain Edu? --      Excl. in GC? --    No data found.  Updated Vital Signs BP (!) 162/93 (BP Location: Right Arm)   Pulse 73   Temp 98.1 F (36.7 C) (Oral)  Resp 18   Ht 5\' 8"  (1.727 m)   Wt 192 lb 0.3 oz (87.1 kg)   SpO2 98%   BMI 29.20 kg/m      Physical Exam Vitals and nursing note reviewed.  Constitutional:      General: He is not in acute distress.    Appearance: Normal appearance. He is well-developed. He is not ill-appearing or toxic-appearing.  HENT:     Head: Normocephalic and atraumatic.  Eyes:     General: No scleral icterus.    Conjunctiva/sclera: Conjunctivae normal.  Cardiovascular:     Rate and Rhythm: Normal rate and regular rhythm.     Heart sounds: No murmur heard.   Pulmonary:     Effort: Pulmonary effort is normal. No respiratory distress.     Breath sounds: Normal breath sounds.  Musculoskeletal:     Cervical back: Neck supple. Spasms (left neck) and tenderness (diffuse TTP C7-T3 and left paracervical muscles) present. No swelling or rigidity. Pain with movement present. Decreased range of motion.  Lymphadenopathy:     Cervical: No cervical adenopathy.  Skin:    General: Skin is warm and dry.  Neurological:     General: No focal deficit present.     Mental Status: He is alert. Mental status is at baseline.     Motor: No weakness.     Gait: Gait normal.  Psychiatric:        Mood and Affect: Mood normal.        Behavior: Behavior normal.        Thought Content: Thought content normal.      UC Treatments / Results  Labs (all labs ordered are listed, but only abnormal results are displayed) Labs Reviewed - No data to display  EKG   Radiology No results found.  Procedures Procedures (including critical care  time)  Medications Ordered in UC Medications - No data to display  Initial Impression / Assessment and Plan / UC Course  I have reviewed the triage vital signs and the nursing notes.  Pertinent labs & imaging results that were available during my care of the patient were reviewed by me and considered in my medical decision making (see chart for details).   Treating patient at this time for cervical strain and thoracic strain.  Advised patient to try heat on the area.  Also advised that he probably get more relief from anti-inflammatories I prescribed diclofenac for him.  Advised him he can continue Tylenol.  Trying a different muscle relaxer, tizanidine and cyclobenzaprine did not work.  Advised him to perform stretches.  Patient has to be on light duty for a couple of days since he has to do a lot of lifting at work so I provided him with a work note.  If symptoms continue beyond that he should follow back up with us or go to orthopedics for reassessment.  For any severe acute worsening of pain or fever has any weakness or numbness you should be seen again emergently.  Patient understanding and agreeable.  Final Clinical Impressions(s) / UC Diagnoses   Final diagnoses:  Acute strain of neck muscle, initial encounter  Thoracic myofascial strain, initial encounter     Discharge Instructions     NECK PAIN: Stressed avoiding painful activities. This can exacerbate your symptoms and make them worse.  May apply heat to the areas of pain for some relief. Use medications as directed. Be aware of which medications make you drowsy and do not drive or operate  any kind of heavy machinery while using the medication (ie pain medications or muscle relaxers). F/U with PCP for reexamination or return sooner if condition worsens or does not begin to improve over the next few days.   NECK PAIN RED FLAGS: If symptoms get worse than they are right now, you should come back sooner for re-evaluation. If you have  increased numbness/ tingling or notice that the numbness/tingling is affecting the legs or saddle region, go to ER. If you ever lose continence go to ER.      BACK PAIN: Stressed avoiding painful activities . RICE (REST, ICE, COMPRESSION, ELEVATION) guidelines reviewed. May alternate ice and heat. Consider use of muscle rubs, Salonpas patches, etc. Use medications as directed including muscle relaxers if prescribed. Take anti-inflammatory medications as prescribed or OTC NSAIDs/Tylenol.  F/u with PCP in 7-10 days for reexamination, and please feel free to call or return to the urgent care at any time for any questions or concerns you may have and we will be happy to help you!   BACK PAIN RED FLAGS: If the back pain acutely worsens or there are any red flag symptoms such as numbness/tingling, leg weakness, saddle anesthesia, or loss of bowel/bladder control, go immediately to the ER. Follow up with [place] as scheduled or sooner if the pain does not begin to resolve or if it worsens before the follow up      ED Prescriptions    Medication Sig Dispense Auth. Provider   diclofenac (VOLTAREN) 75 MG EC tablet Take 1 tablet (75 mg total) by mouth 2 (two) times daily for 15 days. 30 tablet Eusebio Friendly B, PA-C   tiZANidine (ZANAFLEX) 4 MG tablet Take 1 tablet (4 mg total) by mouth every 8 (eight) hours as needed for up to 10 days for muscle spasms. 30 tablet Gareth Morgan     PDMP not reviewed this encounter.   Shirlee Latch, PA-C 05/21/20 [phone]

## 2020-05-21 NOTE — ED Triage Notes (Signed)
Pt c/o neck pain. Started about 3 days ago after waking up from a nap. He states he has to sleep on the couch because laying down is uncomfortable. He has tried tylenol, icy hot without relief.

## 2020-05-22 ENCOUNTER — Other Ambulatory Visit: Payer: Self-pay

## 2020-05-22 ENCOUNTER — Ambulatory Visit (INDEPENDENT_AMBULATORY_CARE_PROVIDER_SITE_OTHER): Payer: 59 | Admitting: Nurse Practitioner

## 2020-05-22 ENCOUNTER — Encounter: Payer: Self-pay | Admitting: Nurse Practitioner

## 2020-05-22 VITALS — BP 150/80 | HR 74 | Temp 97.8°F | Ht 68.0 in | Wt 193.0 lb

## 2020-05-22 DIAGNOSIS — M436 Torticollis: Secondary | ICD-10-CM | POA: Insufficient documentation

## 2020-05-22 DIAGNOSIS — M542 Cervicalgia: Secondary | ICD-10-CM | POA: Insufficient documentation

## 2020-05-22 DIAGNOSIS — S161XXA Strain of muscle, fascia and tendon at neck level, initial encounter: Secondary | ICD-10-CM | POA: Insufficient documentation

## 2020-05-22 DIAGNOSIS — S161XXD Strain of muscle, fascia and tendon at neck level, subsequent encounter: Secondary | ICD-10-CM | POA: Diagnosis not present

## 2020-05-22 NOTE — Patient Instructions (Addendum)
Please fill the medications that you were given through the acute care which is the anti-inflammatory and muscle relaxer. Take as directed. This is what you need for your neck pain and stiffness. You have muscle tenderness.  You can also apply heat for 15 minutes- warm moist heat with a hot wet towel and repeat as often as you can during the day.   Some people find alternating heat with ice pack would also help.  Apply these measures are where it hurts the most.  You can also take Tylenol as directed on the bottle if needed.  Rest, gentle range of motion of your shoulders and neck as tolerated.  Gentle movement is good so the neck  does not stiffen up and tighten.  When your neck improves to where you can use it normally, you may try these exercises below.   You should show improvement in the next 2-3 days if you are taking the medication as ordered.   Please return to the clinic if no improvement or if symptoms worsen.  Cervical Strain and Sprain Rehab Ask your health care provider which exercises are safe for you. Do exercises exactly as told by your health care provider and adjust them as directed. It is normal to feel mild stretching, pulling, tightness, or discomfort as you do these exercises. Stop right away if you feel sudden pain or your pain gets worse. Do not begin these exercises until told by your health care provider. Stretching and range-of-motion exercises Cervical side bending  1. Using good posture, sit on a stable chair or stand up. 2. Without moving your shoulders, slowly tilt your left / right ear to your shoulder until you feel a stretch in the opposite side neck muscles. You should be looking straight ahead. 3. Hold for __________ seconds. 4. Repeat with the other side of your neck. Repeat __________ times. Complete this exercise __________ times a day. Cervical rotation  1. Using good posture, sit on a stable chair or stand up. 2. Slowly turn your head to the side as  if you are looking over your left / right shoulder. ? Keep your eyes level with the ground. ? Stop when you feel a stretch along the side and the back of your neck. 3. Hold for __________ seconds. 4. Repeat this by turning to your other side. Repeat __________ times. Complete this exercise __________ times a day. Thoracic extension and pectoral stretch 1. Roll a towel or a small blanket so it is about 4 inches (10 cm) in diameter. 2. Lie down on your back on a firm surface. 3. Put the towel lengthwise, under your spine in the middle of your back. It should not be under your shoulder blades. The towel should line up with your spine from your middle back to your lower back. 4. Put your hands behind your head and let your elbows fall out to your sides. 5. Hold for __________ seconds. Repeat __________ times. Complete this exercise __________ times a day. Strengthening exercises Isometric upper cervical flexion 1. Lie on your back with a thin pillow behind your head and a small rolled-up towel under your neck. 2. Gently tuck your chin toward your chest and nod your head down to look toward your feet. Do not lift your head off the pillow. 3. Hold for __________ seconds. 4. Release the tension slowly. Relax your neck muscles completely before you repeat this exercise. Repeat __________ times. Complete this exercise __________ times a day. Isometric cervical extension  1.  Stand about 6 inches (15 cm) away from a wall, with your back facing the wall. 2. Place a soft object, about 6-8 inches (15-20 cm) in diameter, between the back of your head and the wall. A soft object could be a small pillow, a ball, or a folded towel. 3. Gently tilt your head back and press into the soft object. Keep your jaw and forehead relaxed. 4. Hold for __________ seconds. 5. Release the tension slowly. Relax your neck muscles completely before you repeat this exercise. Repeat __________ times. Complete this exercise  __________ times a day. Posture and body mechanics Body mechanics refers to the movements and positions of your body while you do your daily activities. Posture is part of body mechanics. Good posture and healthy body mechanics can help to relieve stress in your body's tissues and joints. Good posture means that your spine is in its natural S-curve position (your spine is neutral), your shoulders are pulled back slightly, and your head is not tipped forward. The following are general guidelines for applying improved posture and body mechanics to your everyday activities. Sitting  1. When sitting, keep your spine neutral and keep your feet flat on the floor. Use a footrest, if necessary, and keep your thighs parallel to the floor. Avoid rounding your shoulders, and avoid tilting your head forward. 2. When working at a desk or a computer, keep your desk at a height where your hands are slightly lower than your elbows. Slide your chair under your desk so you are close enough to maintain good posture. 3. When working at a computer, place your monitor at a height where you are looking straight ahead and you do not have to tilt your head forward or downward to look at the screen. Standing   When standing, keep your spine neutral and keep your feet about hip-width apart. Keep a slight bend in your knees. Your ears, shoulders, and hips should line up.  When you do a task in which you stand in one place for a long time, place one foot up on a stable object that is 2-4 inches (5-10 cm) high, such as a footstool. This helps keep your spine neutral. Resting When lying down and resting, avoid positions that are most painful for you. Try to support your neck in a neutral position. You can use a contour pillow or a small rolled-up towel. Your pillow should support your neck but not push on it. This information is not intended to replace advice given to you by your health care provider. Make sure you discuss any  questions you have with your health care provider. Document Revised: 11/16/2018 Document Reviewed: 04/27/2018 Elsevier Patient Education  2020 [employer].

## 2020-05-22 NOTE — Telephone Encounter (Signed)
Patient in came in office and received medication today.

## 2020-05-22 NOTE — Progress Notes (Signed)
Established Patient Office Visit  Subjective:  Patient ID: Alexander Powers, male    DOB: 1965-09-28  Age: 54 y.o. MRN: [id number]  CC:  Chief Complaint  Patient presents with  . Acute Visit    pulled muscle     HPI Alexander Powers is a 54 year old male with hypertension, diabetes, arthritis of the left elbow, hyperlipidemia who comes in for pulled muscle in his left shoulder area.  Last Friday, he was unloading a refrigerator with his father, and was pulling most of the refrigerator.  The refrigerator fell and he tried to catch it with his left side of his body.  It struck his left side, left ribs, and it was mild and painful but he continued to move the refrigerator where it needed to go.  He did not think much about it.  On Saturday morning, he woke up and had a stiff neck on the left, was able to go to work for 4 hours but had to come home due to pain.   He was seen in acute care yesterday and diagnosed with acute strain of the neck and was provided an NSAID and muscle relaxer which he has not picked up yet d/t cost. He gets paid on Fri.  He has been taking Tylenol.   Today, he is having pain in the lower left neck and radiates under the left shoulder blade.  He has put on a old sling and that he has just to keep the arm from moving as movement tends to make this worse.  Movement triggers pain in this area.  He has noted no numbness or tingling or sharp pains down the arm.  He reports  normal strength and sensation in the left arm.   HTN: He ran out of BP med - last week off of his meds  BP Readings from Last 3 Encounters:  05/22/20 (!) 150/80  05/21/20 (!) 162/93  01/15/20 (!) 129/91   Past Medical History:  Diagnosis Date  . Diabetes mellitus without complication (HCC)   . Difficult intubation   . GERD (gastroesophageal reflux disease)    OCC- TUMS  . Glaucoma   . Hypercholesteremia   . Hypertension   . Strep throat    01/24/18     Past Surgical History:  Procedure  Laterality Date  . HERNIA REPAIR    . INSERTION OF MESH N/A 10/22/2017   Procedure: INSERTION OF MESH;  Surgeon: Ancil Linseyavis, Jason Evan, MD;  Location: [facility] ORS;  Service: General;  Laterality: N/A;  . UMBILICAL HERNIA REPAIR N/A 10/22/2017   Procedure: HERNIA REPAIR UMBILICAL ADULT WITH MESH;  Surgeon: Ancil Linseyavis, Jason Evan, MD;  Location: [facility] OR[facility];  Service: General;  Laterality: N/A;    Family History  Problem Relation Age of Onset  . Diabetes Father   . Hyperlipidemia Father   . Hypertension Father   . Kidney disease Father   . Diabetes Paternal Uncle     Social History   Socioeconomic History  . Marital status: Single    Spouse name: Not on file  . Number of children: Not on file  . Years of education: Not on file  . Highest education level: Not on file  Occupational History  . Not on file  Tobacco Use  . Smoking status: Current Every Day Smoker    Packs/day: 0.50    Years: 27.00    Pack years: 13.50    Types: Cigarettes  . Smokeless tobacco: Never Used  . Tobacco comment: age [age] 1/2  ppd quit age 63   Vaping Use  . Vaping Use: Never used  Substance and Sexual Activity  . Alcohol use: Yes    Comment: occasionally  . Drug use: Not Currently    Types: Marijuana  . Sexual activity: Not on file  Other Topics Concern  . Not on file  Social History Narrative   GED, Location manager    No guns    Wears seat belt    Safe in relationship engaged to [employer]    Works [employer]   -out of work since 04/2019 on disability       Lives [place]   Social Determinants of Health   Financial Resource Strain:   . Difficulty of Paying Living Expenses: Not on file  Food Insecurity:   . Worried About Programme researcher, broadcasting/film/video in the Last Year: Not on file  . Ran Out of Food in the Last Year: Not on file  Transportation Needs:   . Lack of Transportation (Medical): Not on file  . Lack of Transportation (Non-Medical): Not on file  Physical Activity:   .  Days of Exercise per Week: Not on file  . Minutes of Exercise per Session: Not on file  Stress:   . Feeling of Stress : Not on file  Social Connections:   . Frequency of Communication with Friends and Family: Not on file  . Frequency of Social Gatherings with Friends and Family: Not on file  . Attends Religious Services: Not on file  . Active Member of Clubs or Organizations: Not on file  . Attends Banker Meetings: Not on file  . Marital Status: Not on file  Intimate Partner Violence:   . Fear of Current or Ex-Partner: Not on file  . Emotionally Abused: Not on file  . Physically Abused: Not on file  . Sexually Abused: Not on file    Outpatient Medications Prior to Visit  Medication Sig Dispense Refill  . acetaminophen (TYLENOL) 500 MG tablet Take 1 tablet (500 mg total) by mouth every 4 (four) hours as needed. 90 tablet 3  . albuterol (VENTOLIN HFA) 108 (90 Base) MCG/ACT inhaler Inhale 1-2 puffs into the lungs every 4 (four) hours as needed for wheezing or shortness of breath. 18 g 2  . amLODipine (NORVASC) 2.5 MG tablet Take 1 tablet (2.5 mg total) by mouth daily. 90 tablet 3  . cetirizine (ZYRTEC) 10 MG tablet Take 1 tablet (10 mg total) by mouth daily as needed for allergies. 90 tablet 3  . Cholecalciferol 1.25 MG (50000 UT) capsule Take 1 capsule (50,000 Units total) by mouth once a week. 13 capsule 1  . diclofenac (VOLTAREN) 75 MG EC tablet Take 1 tablet (75 mg total) by mouth 2 (two) times daily for 15 days. 30 tablet 0  . diclofenac sodium (VOLTAREN) 1 % GEL Apply 2 g topically 4 (four) times daily. 100 g 12  . latanoprost (XALATAN) 0.005 % ophthalmic solution INSTILL 1 DROP INTO EACH EYE NIGHTLY  12  . losartan-hydrochlorothiazide (HYZAAR) 100-25 MG tablet Take 1 tablet by mouth daily. 90 tablet 3  . meloxicam (MOBIC) 15 MG tablet Take 1 tablet (15 mg total) by mouth daily as needed for pain. 30 tablet 1  . oxyCODONE-acetaminophen (PERCOCET) 5-325 MG tablet Take 1  tablet by mouth 2 (two) times daily as needed for severe pain. 10 tablet 0  . rosuvastatin (CRESTOR) 10 MG tablet Take 1 tablet (10 mg total) by mouth  daily. 90 tablet 3  . Semaglutide,0.25 or 0.5MG /DOS, 2 MG/1.5ML SOPN Inject 0.375 mLs (0.5 mg total) into the skin every 7 (seven) days. 5 pen 12  . tiZANidine (ZANAFLEX) 4 MG tablet Take 1 tablet (4 mg total) by mouth every 8 (eight) hours as needed for up to 10 days for muscle spasms. 30 tablet 0  . amoxicillin-clavulanate (AUGMENTIN) 875-125 MG tablet Take 1 tablet by mouth 2 (two) times daily. With food (Patient not taking: Reported on 05/22/2020) 14 tablet 0  . Dextromethorphan-guaiFENesin (MUCINEX DM MAXIMUM STRENGTH) 60-1200 MG TB12 Take 1 tablet by mouth 2 (two) times daily as needed. (Patient not taking: Reported on 05/22/2020) 40 tablet 0   No facility-administered medications prior to visit.    No Known Allergies  Review of Systems  Constitutional: Negative for chills and fever.  HENT: Negative.   Respiratory: Negative for cough and shortness of breath.   Cardiovascular: Negative for chest pain, palpitations and leg swelling.  Gastrointestinal: Negative for abdominal pain.  Musculoskeletal: Positive for neck pain and neck stiffness.  Skin: Negative.  Negative for rash.  Neurological: Negative for dizziness and headaches.      Objective:    Physical Exam Vitals reviewed.  Constitutional:      Appearance: He is normal weight.  Neck:     Comments: The C spine is non tender. He has surrounding muscle tenderness and decreased ROM in flexion, extension, and lateral movements. Cardiovascular:     Rate and Rhythm: Normal rate and regular rhythm.     Pulses: Normal pulses.     Heart sounds: Normal heart sounds.  Pulmonary:     Effort: Pulmonary effort is normal.     Breath sounds: Normal breath sounds.  Chest:     Chest wall: No tenderness.  Abdominal:     Palpations: Abdomen is soft.     Tenderness: There is no abdominal  tenderness.  Musculoskeletal:     Cervical back: Tenderness present. No rigidity.     Comments: He has tenderness left left muscles and upper shoulder back areas. He has normal ROM shoulders and arm. No rib pain or chest wall pain.Left arm and hand strength and sensation normal.  Lymphadenopathy:     Cervical: No cervical adenopathy.  Skin:    General: Skin is warm and dry.  Neurological:     General: No focal deficit present.     Mental Status: He is alert and oriented to person, place, and time.     Sensory: No sensory deficit.     Motor: No weakness.  Psychiatric:        Mood and Affect: Mood normal.        Behavior: Behavior normal.     BP (!) 150/80 (BP Location: Left Arm, Patient Position: Sitting, Cuff Size: Normal)   Pulse 74   Temp 97.8 F (36.6 C) (Oral)   Ht 5\' 8"  (1.727 m)   Wt 193 lb (87.5 kg)   SpO2 97%   BMI 29.35 kg/m  Wt Readings from Last 3 Encounters:  05/22/20 193 lb (87.5 kg)  05/21/20 192 lb 0.3 oz (87.1 kg)  01/19/20 192 lb (87.1 kg)   Pulse Readings from Last 3 Encounters:  05/22/20 74  05/21/20 73  01/15/20 85    BP Readings from Last 3 Encounters:  05/22/20 (!) 150/80  05/21/20 (!) 162/93  01/15/20 (!) 129/91    Lab Results  Component Value Date   CHOL 263 (H) 12/22/2019   HDL 49.30 12/22/2019  LDLCALC 192 (H) 12/22/2019   TRIG 110.0 12/22/2019   CHOLHDL 5 12/22/2019      Health Maintenance Due  Topic Date Due  . PNEUMOCOCCAL POLYSACCHARIDE VACCINE AGE 12-64 HIGH RISK  Never done  . FOOT EXAM  Never done  . OPHTHALMOLOGY EXAM  Never done  . TETANUS/TDAP  Never done  . COLONOSCOPY  Never done  . INFLUENZA VACCINE  Never done    There are no preventive care reminders to display for this patient.  Lab Results  Component Value Date   TSH 3.78 12/22/2019   Lab Results  Component Value Date   WBC 9.3 12/22/2019   HGB 16.2 12/22/2019   HCT 48.8 12/22/2019   MCV 79.5 12/22/2019   PLT 289.0 12/22/2019   Lab Results    Component Value Date   NA 137 01/01/2020   K 3.8 01/01/2020   CO2 25 01/01/2020   GLUCOSE 120 (H) 01/01/2020   BUN 12 01/01/2020   CREATININE 0.90 01/01/2020   BILITOT 0.9 01/01/2020   ALKPHOS 75 01/01/2020   AST 27 01/01/2020   ALT 27 01/01/2020   PROT 6.9 01/01/2020   ALBUMIN 4.0 01/01/2020   CALCIUM 9.5 01/01/2020   ANIONGAP 11 01/01/2020   GFR 104.19 12/22/2019     Assessment & Plan:   Problem List Items Addressed This Visit      Musculoskeletal and Integument   Acute strain of neck muscle - Primary   Acute torticollis     Please fill the medications that you were given through the acute care which is the anti-inflammatory and muscle relaxer. Take as directed. This is what you need for your neck pain and stiffness. You have muscle tenderness.  You can also apply heat for 15 minutes- warm moist heat with a hot wet towel and repeat as often as you can during the day.   Some people find alternating heat with ice pack would also help.  Apply these measures are where it hurts the most.  You can also take Tylenol as directed on the bottle if needed.  Rest, gentle range of motion of your shoulders and neck as tolerated.  Gentle movement is good so the neck  does not stiffen up and tighten.  When your neck improves to where you can use it normally, you may try these exercises below.   You should show improvement in the next 2-3 days if you are taking the medication as ordered.   Work note provided and patient may return to work on Tuesday, May 27, 2020.  Please return to the clinic if no improvement or if symptoms worsen.  No orders of the defined types were placed in this encounter.   Follow-up: Return if symptoms worsen or fail to improve.  This visit occurred during the SARS-CoV-2 public health emergency.  Safety protocols were in place, including screening questions prior to the visit, additional usage of staff PPE, and extensive cleaning of exam room while  observing appropriate contact time as indicated for disinfecting solutions.    Amedeo Kinsman, NP

## 2020-05-24 ENCOUNTER — Encounter: Payer: Self-pay | Admitting: Nurse Practitioner

## 2020-06-07 ENCOUNTER — Telehealth: Payer: Self-pay | Admitting: Internal Medicine

## 2020-06-07 NOTE — Telephone Encounter (Signed)
I will complete this over the weekend.

## 2020-06-07 NOTE — Telephone Encounter (Signed)
Noted please place forms back in my box will fill out Tuesday  Arianna had them   Thank you

## 2020-06-07 NOTE — Telephone Encounter (Signed)
Not thank you Selena Batten forms Joanne Gavel should know where they are

## 2020-06-07 NOTE — Telephone Encounter (Signed)
Kim NP will complete she was the treating provider.

## 2020-06-07 NOTE — Telephone Encounter (Signed)
Patient needs short term disability from the 05/22/20 visit with NP Fransico Setters due to neck strain , forms given to [facility] for NP.

## 2020-06-10 ENCOUNTER — Encounter: Payer: Self-pay | Admitting: Nurse Practitioner

## 2020-06-10 ENCOUNTER — Other Ambulatory Visit: Payer: Self-pay

## 2020-06-10 ENCOUNTER — Telehealth (INDEPENDENT_AMBULATORY_CARE_PROVIDER_SITE_OTHER): Payer: 59 | Admitting: Nurse Practitioner

## 2020-06-10 VITALS — Ht 68.0 in | Wt 192.0 lb

## 2020-06-10 DIAGNOSIS — S161XXD Strain of muscle, fascia and tendon at neck level, subsequent encounter: Secondary | ICD-10-CM

## 2020-06-10 DIAGNOSIS — M436 Torticollis: Secondary | ICD-10-CM

## 2020-06-10 NOTE — Telephone Encounter (Signed)
Thank you for the heads up and I will get it done today.

## 2020-06-10 NOTE — Telephone Encounter (Signed)
He does need a video visit and if no access a telephone follow up for me to answer the questions on his form. Can he be added to my schedule?

## 2020-06-10 NOTE — Telephone Encounter (Signed)
Patients forms have been filled out and faxed

## 2020-06-10 NOTE — Telephone Encounter (Signed)
Patient haa been scheduled for 1 today to answer any questions concerning Short term disability paperwork. Patient needs paperwork faxed by 06/11/20 in order not to lose his job.

## 2020-06-11 ENCOUNTER — Encounter: Payer: Self-pay | Admitting: Nurse Practitioner

## 2020-06-11 NOTE — Progress Notes (Signed)
Virtual Visit via telephone Note  This visit type was conducted due to national recommendations for restrictions regarding the COVID-19 pandemic (e.g. social distancing).  This format is felt to be most appropriate for this patient at this time.  All issues noted in this document were discussed and addressed.  No physical exam was performed (except for noted visual exam findings with Video Visits).   I connected with@ on 06/11/20 at  1:00 PM EDT by a video enabled telemedicine application or telephone and verified that I am speaking with the correct person using two identifiers. Location patient: home Location provider: work or home office Persons participating in the virtual visit: patient, provider  I discussed the limitations, risks, security and privacy concerns of performing an evaluation and management service by telephone and the availability of in person appointments. I also discussed with the patient that there may be a patient responsible charge related to this service. The patient expressed understanding and agreed to proceed.  Interactive audio and video telecommunications were attempted between this provider and patient, however failed, due to patient having technical difficulties.  We continued and completed visit with audio only.   Reason for visit: F/up neck pain and need ST disability form completed.  HPI: Alexander Powers reports that his neck pain has fully resolved.  He was seen in the office on 05/22/2020 for what he thought was a pulled muscle in his left shoulder area.  He had been unloading a refrigerator the Friday before and it fell, he caught with the left side of his body.  It struck his left side, ribs, and he continued to function.  At the next days however he had stiff neck, was having quite a bit of pain on the left side.  He had marked decreased range of motion of his neck, and had pain radiating from the left neck to the left shoulder blade.  He had a prescription for  diclofenac 75 mg twice daily and tizanidine 4 mg every 8 hours as needed and was advised to take the medication. He  was advised to apply heat, alternating ice if that made it feel better.  Gentle range of motion, Tylenol as needed, and over the following week, symptoms improved.  Patient was able to go back to work on 06/04/2020.  He did need a little bit lighter duty, was placed on a different line that was less physical and he did well.  Today, he reports he is back to normal.   ROS: See pertinent positives and negatives per HPI.  Past Medical History:  Diagnosis Date  . Diabetes mellitus without complication (HCC)   . Difficult intubation   . GERD (gastroesophageal reflux disease)    OCC- TUMS  . Glaucoma   . Hypercholesteremia   . Hypertension   . Strep throat    01/24/18     Past Surgical History:  Procedure Laterality Date  . HERNIA REPAIR    . INSERTION OF MESH N/A 10/22/2017   Procedure: INSERTION OF MESH;  Surgeon: Ancil Linsey, MD;  Location: [facility] OR[facility];  Service: General;  Laterality: N/A;  . UMBILICAL HERNIA REPAIR N/A 10/22/2017   Procedure: HERNIA REPAIR UMBILICAL ADULT WITH MESH;  Surgeon: Ancil Linsey, MD;  Location: [facility];  Service: General;  Laterality: N/A;    Family History  Problem Relation Age of Onset  . Diabetes Father   . Hyperlipidemia Father   . Hypertension Father   . Kidney disease Father   . Diabetes Paternal  Uncle      Current Outpatient Medications:  .  acetaminophen (TYLENOL) 500 MG tablet, Take 1 tablet (500 mg total) by mouth every 4 (four) hours as needed., Disp: 90 tablet, Rfl: 3 .  albuterol (VENTOLIN HFA) 108 (90 Base) MCG/ACT inhaler, Inhale 1-2 puffs into the lungs every 4 (four) hours as needed for wheezing or shortness of breath., Disp: 18 g, Rfl: 2 .  amLODipine (NORVASC) 2.5 MG tablet, Take 1 tablet (2.5 mg total) by mouth daily., Disp: 90 tablet, Rfl: 3 .  cetirizine (ZYRTEC) 10 MG tablet, Take 1 tablet (10 mg total) by  mouth daily as needed for allergies., Disp: 90 tablet, Rfl: 3 .  Cholecalciferol 1.25 MG (50000 UT) capsule, Take 1 capsule (50,000 Units total) by mouth once a week., Disp: 13 capsule, Rfl: 1 .  losartan-hydrochlorothiazide (HYZAAR) 100-25 MG tablet, Take 1 tablet by mouth daily., Disp: 90 tablet, Rfl: 3 .  rosuvastatin (CRESTOR) 10 MG tablet, Take 1 tablet (10 mg total) by mouth daily., Disp: 90 tablet, Rfl: 3 .  Semaglutide,0.25 or 0.5MG /DOS, 2 MG/1.5ML SOPN, Inject 0.375 mLs (0.5 mg total) into the skin every 7 (seven) days., Disp: 5 pen, Rfl: 12  EXAM:  VITALS per patient if applicable:Wt 192  GENERAL: Sounds alert, oriented,in no acute distress  PSYCH/NEURO: pleasant and cooperative, no obvious depression or anxiety, speech and thought processing grossly intact  ASSESSMENT AND PLAN:  Discussed the following assessment and plan:  Acute strain of neck muscle, subsequent encounter  Acute torticollis  Short term disability form completed. Neck pain is resolved.Patient has been back to work since 06/04/20 and reports he is doing well.  Follow up as needed.    I discussed the assessment and treatment plan with the patient. The patient was provided an opportunity to ask questions and all were answered. The patient agreed with the plan and demonstrated an understanding of the instructions.   The patient was advised to call back or seek an in-person evaluation if the symptoms worsen or if the condition fails to improve as anticipated.  A total of 8 minutes of time spent with patient on telephone.   Amedeo Kinsman, NP Adult Nurse Practitioner [facility] [employer] [phone]

## 2020-06-11 NOTE — Patient Instructions (Addendum)
Work short term disability form completed. Neck pain is resolved. Follow up as needed.

## 2020-06-12 ENCOUNTER — Ambulatory Visit: Payer: 59 | Admitting: Nurse Practitioner

## 2020-06-20 ENCOUNTER — Ambulatory Visit: Payer: 59

## 2020-06-20 ENCOUNTER — Other Ambulatory Visit: Payer: Self-pay

## 2020-06-20 DIAGNOSIS — Z79899 Other long term (current) drug therapy: Secondary | ICD-10-CM

## 2020-06-20 NOTE — Progress Notes (Signed)
Medication Management Clinic Visit Note  Patient: Alexander Powers MRN: [id number] Date of Birth: [birth year] PCP: McLean-Scocuzza, Pasty Spillers, MD   Su Hilt 54 y.o. male presents for a medication therapy management visit today.  There were no vitals taken for this visit.  Patient Information   Past Medical History:  Diagnosis Date  . Diabetes mellitus without complication (HCC)   . Difficult intubation   . GERD (gastroesophageal reflux disease)    OCC- TUMS  . Glaucoma   . Hypercholesteremia   . Hypertension   . Strep throat    01/24/18       Past Surgical History:  Procedure Laterality Date  . HERNIA REPAIR    . INSERTION OF MESH N/A 10/22/2017   Procedure: INSERTION OF MESH;  Surgeon: Ancil Linsey, MD;  Location: [facility] ORS;  Service: General;  Laterality: N/A;  . UMBILICAL HERNIA REPAIR N/A 10/22/2017   Procedure: HERNIA REPAIR UMBILICAL ADULT WITH MESH;  Surgeon: Ancil Linsey, MD;  Location: [facility];  Service: General;  Laterality: N/A;     Family History  Problem Relation Age of Onset  . Diabetes Father   . Hyperlipidemia Father   . Hypertension Father   . Kidney disease Father   . Diabetes Paternal Uncle     New Diagnoses (since last visit): None  Lifestyle Diet: Breakfast: 2 eggs and [place] sausage Lunch: Popeyes chicken and potatoes  Dinner: [place] burger and fries Snacks: Lollipop and tootsie roll  Drinks: Water and Dr. Reino Kent            Social History   Substance and Sexual Activity  Alcohol Use Yes   Comment: occasionally      Social History   Tobacco Use  Smoking Status Current Every Day Smoker  . Packs/day: 0.50  . Years: 27.00  . Pack years: 13.50  . Types: Cigarettes  Smokeless Tobacco Never Used  Tobacco Comment   age [age] 1/2 ppd quit age 29       Health Maintenance  Topic Date Due  . PNEUMOCOCCAL POLYSACCHARIDE VACCINE AGE 47-64 HIGH RISK  Never done  . FOOT EXAM  Never done  . OPHTHALMOLOGY EXAM  Never  done  . TETANUS/TDAP  Never done  . COLONOSCOPY  Never done  . INFLUENZA VACCINE  11/07/2020 (Originally 03/10/2020)  . HEMOGLOBIN A1C  06/23/2020  . COVID-19 Vaccine  Completed  . Hepatitis C Screening  Completed  . HIV Screening  Completed   Health Maintenance/Date Completed  Last ED visit: 05/21/20 Last Visit to PCP: 05/2020 Next Visit to PCP: Not scheduled Dental Exam: Pt is in need of dental care - informed pt about program with [employer] dental school  Eye Exam: Pt is in need of eye care - informed pt about program with [facility] Flu Vaccine: Pt refused COVID-19 Vaccine: Pt received 2nd dose 12/2019  Outpatient Encounter Medications as of 06/20/2020  Medication Sig  . acetaminophen (TYLENOL) 500 MG tablet Take 1 tablet (500 mg total) by mouth every 4 (four) hours as needed.  Marland Kitchen albuterol (VENTOLIN HFA) 108 (90 Base) MCG/ACT inhaler Inhale 1-2 puffs into the lungs every 4 (four) hours as needed for wheezing or shortness of breath.  Marland Kitchen amLODipine (NORVASC) 2.5 MG tablet Take 1 tablet (2.5 mg total) by mouth daily.  . cetirizine (ZYRTEC) 10 MG tablet Take 1 tablet (10 mg total) by mouth daily as needed for allergies.  . Cholecalciferol 1.25 MG (50000 UT) capsule Take 1 capsule (50,000 Units total) by mouth  once a week.  . losartan-hydrochlorothiazide (HYZAAR) 100-25 MG tablet Take 1 tablet by mouth daily.  . rosuvastatin (CRESTOR) 10 MG tablet Take 1 tablet (10 mg total) by mouth daily.  . Semaglutide,0.25 or 0.5MG /DOS, 2 MG/1.5ML SOPN Inject 0.375 mLs (0.5 mg total) into the skin every 7 (seven) days.   No facility-administered encounter medications on file as of 06/20/2020.     Assessment and Plan:  Access/Adherence: Pt reports very rare missed medication doses. Pt reports no issues with access to medications, housing, transportation, or food.   Diabetes: Pt uses Ozempic 0.5 mg weekly and rosuvastatin 10 mg daily. Pt reports no issues with Ozempic or rosuvastatin. A1c 7.5%  12/22/19.  Plan: Continue weekly Ozempic.  Hypertension: Pt takes amlodipine 2.5 mg daily and losartan-HCTZ 100-25 mg daily. Pt does not monitor blood pressure at home. BP 05/22/20: 150/80. Plan: Continue amlodpine and losartan-HCTZ daily.   Shortness of breath: Pt uses albuterol inhaler 2-3x/week.  Plan: Continue use of albuterol as needed for shortness of breath  Pain: Pt takes acetaminophen 500 mg every 4 hours as needed for pain. Plan: Continue with as needed acetaminophen.   Vitamin D deficiency: Pt takes cholecalciferol 1.25 mg (50,000 unit) weekly. Plan: Continue weekly cholecalciferol.  Allergies: Pt takes cetirizine 10 mg daily as needed. Plan: Continue as needed cetirizine.   Follow up in 1 year for Outreach MTM  Reatha Armour, PharmD Pharmacy Resident  06/20/2020 9:29 AM

## 2020-08-06 ENCOUNTER — Telehealth: Payer: Self-pay | Admitting: Pharmacy Technician

## 2020-08-06 NOTE — Telephone Encounter (Signed)
Patient has prescription drug coverage.  No longer meets [facility] eligibility criteria.  Pt notified.  Sherilyn Dacosta Care Manager [facility]   [address] [place], [place]  [address]  August 05, 2020    Demoni Gergen [address] [place], [place]  [address]  Dear Marcial Pacas:  This is to inform you that you are no longer eligible to receive medication assistance at [facility].  The reason(s) are:    _____Your total gross monthly household income exceeds 250% of the Federal Poverty Level.   _____Tangible assets (savings, checking, stocks/bonds, pension, retirement, etc.) exceeds our limit  _____You are eligible to receive benefits from [facility] or HIV Medication            Assistance program _____You are eligible to receive benefits from a Medicare Part "D" plan __X__You have prescription insurance  _____You are not an [facility] resident _____Failure to provide all requested proof of income information for 2021.    We regret that we are unable to help you at this time.  If your prescription coverage is terminated, please contact [facility], so that we may reassess your eligibility for our program.  If you have questions, we may be contacted at [phone].  Thank you,  [facility]

## 2020-11-20 ENCOUNTER — Other Ambulatory Visit: Payer: Self-pay

## 2020-11-20 ENCOUNTER — Ambulatory Visit
Admission: EM | Admit: 2020-11-20 | Discharge: 2020-11-20 | Disposition: A | Discharge status: Home / Self Care | Payer: 59 | Attending: Emergency Medicine | Admitting: Emergency Medicine

## 2020-11-20 DIAGNOSIS — S91301A Unspecified open wound, right foot, initial encounter: Secondary | ICD-10-CM

## 2020-11-20 MED ORDER — AMOXICILLIN-POT CLAVULANATE 875-125 MG PO TABS
1.0000 | ORAL_TABLET | Freq: Two times a day (BID) | ORAL | 0 refills | Status: DC
Start: 2020-11-20 — End: 2020-12-09

## 2020-11-20 MED ORDER — SULFAMETHOXAZOLE-TRIMETHOPRIM 800-160 MG PO TABS: 1.0000 | ORAL_TABLET | Freq: Two times a day (BID) | ORAL | 0 refills | Status: AC

## 2020-11-20 NOTE — Discharge Instructions (Addendum)
Take the Augmentin and Bactrim twice daily with food for 7 days.  Continue to wash your feet nightly and apply lotion to keep the skin supple.  Keep your right foot open to the air as much as possible to allow for wound healing and prevent infection.  You can use a nonadherent dressing placed between your toes to keep the skin from rubbing together and making the wound worse.    If you develop any increased swelling of your right little toe, redness, red streaks going up your foot, fullness in your groin, or fever.  Or if you develop drainage from the wound you need to be evaluated by podiatry or be seen in the emergency department.

## 2020-11-20 NOTE — ED Provider Notes (Signed)
[facility]    CSN: [id number] Arrival date & time: 11/20/20  1134      History   Chief Complaint Chief Complaint  Patient presents with  . Foot Pain    right    HPI Alexander Powers is a 55 y.o. male.   HPI   55 year old male here for evaluation of pain in his right little toe.  Patient reports that he has been having pain and swelling to the right little toe in the webspace between the fourth and fifth toe for the past 3 weeks.  Patient reports that he put a shoe on 1 day and then he felt a "stinging" and thought he might have been bitten by a bug.  Since that time he has had intermittent swelling and he is also reporting some peeling of the sole of his foot between the fourth and fifth toes.  Patient denies any known injury, drainage from the wound, and states that his sugars have not been elevated though he admits he does not check them routinely.  Past Medical History:  Diagnosis Date  . Diabetes mellitus without complication (HCC)   . Difficult intubation   . GERD (gastroesophageal reflux disease)    OCC- TUMS  . Glaucoma   . Hypercholesteremia   . Hypertension   . Strep throat    01/24/18     Patient Active Problem List   Diagnosis Date Noted  . Acute strain of neck muscle 05/22/2020  . Neck pain 05/22/2020  . Acute torticollis 05/22/2020  . 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 Right groin hernia 08/03/2019  . Vitamin D deficiency 12/15/2018  . DM2 (diabetes mellitus, type 2) (HCC) 04/21/2018  . HTN (hypertension) 04/21/2018  . HLD (hyperlipidemia) 04/21/2018  . Glaucoma 04/21/2018  . Umbilical hernia without obstruction or gangrene     Past Surgical History:  Procedure Laterality Date  . HERNIA REPAIR    . INSERTION OF MESH N/A 10/22/2017   Procedure: INSERTION OF MESH;  Surgeon: Ancil Linsey, MD;  Location: [facility] ORS;  Service: General;  Laterality: N/A;  . UMBILICAL HERNIA REPAIR N/A 10/22/2017   Procedure: HERNIA REPAIR UMBILICAL  ADULT WITH MESH;  Surgeon: Ancil Linsey, MD;  Location: [facility] ORS;  Service: General;  Laterality: N/A;       Home Medications    Prior to Admission medications   Medication Sig Start Date End Date Taking? Authorizing Provider  acetaminophen (TYLENOL) 500 MG tablet Take 1 tablet (500 mg total) by mouth every 4 (four) hours as needed. 03/22/19  Yes McLean-Scocuzza, Pasty Spillers, MD  albuterol (VENTOLIN HFA) 108 (90 Base) MCG/ACT inhaler Inhale 1-2 puffs into the lungs every 4 (four) hours as needed for wheezing or shortness of breath. 07/04/19  Yes McLean-Scocuzza, Pasty Spillers, MD  amLODipine (NORVASC) 2.5 MG tablet Take 1 tablet (2.5 mg total) by mouth daily. 07/04/19  Yes McLean-Scocuzza, Pasty Spillers, MD  amoxicillin-clavulanate (AUGMENTIN) 875-125 MG tablet Take 1 tablet by mouth every 12 (twelve) hours. 11/20/20  Yes Becky Augusta, NP  cetirizine (ZYRTEC) 10 MG tablet Take 1 tablet (10 mg total) by mouth daily as needed for allergies. 07/04/19  Yes McLean-Scocuzza, Pasty Spillers, MD  losartan-hydrochlorothiazide (HYZAAR) 100-25 MG tablet Take 1 tablet by mouth daily. 07/04/19  Yes McLean-Scocuzza, Pasty Spillers, MD  rosuvastatin (CRESTOR) 10 MG tablet Take 1 tablet (10 mg total) by mouth daily. 01/09/20  Yes McLean-Scocuzza, Pasty Spillers, MD  Semaglutide,0.25 or 0.5MG /DOS, 2 MG/1.5ML SOPN Inject 0.375 mLs (0.5 mg  total) into the skin every 7 (seven) days. 03/08/20  Yes McLean-Scocuzza, Pasty Spillers, MD  sulfamethoxazole-trimethoprim (BACTRIM DS) 800-160 MG tablet Take 1 tablet by mouth 2 (two) times daily for 7 days. 11/20/20 11/27/20 Yes Becky Augusta, NP  Cholecalciferol 1.25 MG (50000 UT) capsule Take 1 capsule (50,000 Units total) by mouth once a week. 12/15/18   McLean-Scocuzza, Pasty Spillers, MD    Family History Family History  Problem Relation Age of Onset  . Diabetes Father   . Hyperlipidemia Father   . Hypertension Father   . Kidney disease Father   . Diabetes Paternal Uncle     Social History Social History    Tobacco Use  . Smoking status: Current Every Day Smoker    Packs/day: 0.50    Years: 27.00    Pack years: 13.50    Types: Cigarettes  . Smokeless tobacco: Never Used  . Tobacco comment: age [age] 1/2 ppd quit age 56   Vaping Use  . Vaping Use: Never used  Substance Use Topics  . Alcohol use: Yes    Comment: occasionally  . Drug use: Not Currently    Types: Marijuana     Allergies   Patient has no known allergies.   Review of Systems Review of Systems  Constitutional: Negative for fever.  Skin: Positive for wound. Negative for color change and rash.  Hematological: Negative.   Psychiatric/Behavioral: Negative.      Physical Exam Triage Vital Signs ED Triage Vitals  Enc Vitals Group     BP 11/20/20 1215 (!) 144/88     Pulse Rate 11/20/20 1215 70     Resp 11/20/20 1215 18     Temp 11/20/20 1215 98.2 F (36.8 C)     Temp Source 11/20/20 1215 Oral     SpO2 11/20/20 1215 100 %     Weight 11/20/20 1213 190 lb (86.2 kg)     Height 11/20/20 1213 5' 7.5" (1.715 m)     Head Circumference --      Peak Flow --      Pain Score 11/20/20 1213 5     Pain Loc --      Pain Edu? --      Excl. in GC? --    No data found.  Updated Vital Signs BP (!) 144/88 (BP Location: Left Arm)   Pulse 70   Temp 98.2 F (36.8 C) (Oral)   Resp 18   Ht 5' 7.5" (1.715 m)   Wt 190 lb (86.2 kg)   SpO2 100%   BMI 29.32 kg/m   Visual Acuity Right Eye Distance:   Left Eye Distance:   Bilateral Distance:    Right Eye Near:   Left Eye Near:    Bilateral Near:     Physical Exam Vitals and nursing note reviewed.  Constitutional:      General: He is not in acute distress.    Appearance: Normal appearance. He is not ill-appearing.  HENT:     Head: Normocephalic and atraumatic.  Musculoskeletal:        General: Signs of injury present. No swelling or tenderness. Normal range of motion.  Skin:    General: Skin is warm and dry.     Capillary Refill: Capillary refill takes less than  2 seconds.     Findings: No bruising, erythema or rash.  Neurological:     General: No focal deficit present.     Mental Status: He is alert and oriented to person, place, and  time.  Psychiatric:        Mood and Affect: Mood normal.        Behavior: Behavior normal.        Thought Content: Thought content normal.        Judgment: Judgment normal.      UC Treatments / Results  Labs (all labs ordered are listed, but only abnormal results are displayed) Labs Reviewed - No data to display  EKG   Radiology No results found.  Procedures Procedures (including critical care time)  Medications Ordered in UC Medications - No data to display  Initial Impression / Assessment and Plan / UC Course  I have reviewed the triage vital signs and the nursing notes.  Pertinent labs & imaging results that were available during my care of the patient were reviewed by me and considered in my medical decision making (see chart for details).   Patient is a 55 year old diabetic male here for evaluation of right fifth toe pain that started 3 weeks ago.  Patient reports the pain is in the toe and the webspace between the fourth and fifth toe.  He reports that he initially thought it was a bug bite because he felt a stinging in that area after putting on his shoe 1 day but he and his wife have not been able to locate anything.  Patient reports that he washes his feet every day and had been applying lotion until this pain developed.  Physical exam reveals a right fifth toe that is mildly erythematous and edematous but not hot.  There are no red streaks ascending the foot.  DP and PT pulses are 2+.  When examining the webspace between the toes there is an obvious split where the toe meets the foot.  The wound bed is pink granular tissue without surrounding erythema, edema, or drainage.  Patient reports that he does not feel like his toes are rubbing against inside of his shoes.  Will have patient continue to  wash his feet twice daily and apply lotion.  Will cover patient with Bactrim and Augmentin patient is a diabetic and has a right foot wound.  Patient vies that if symptoms not improved he needs to see podiatry.  Final Clinical Impressions(s) / UC Diagnoses   Final diagnoses:  Open wound of right foot with complication, initial encounter     Discharge Instructions     Take the Augmentin and Bactrim twice daily with food for 7 days.  Continue to wash your feet nightly and apply lotion to keep the skin supple.  Keep your right foot open to the air as much as possible to allow for wound healing and prevent infection.  You can use a nonadherent dressing placed between your toes to keep the skin from rubbing together and making the wound worse.  If you develop any increased swelling of your right little toe, redness, red streaks going up your foot, fullness in your groin, or fever.  Or if you develop drainage from the wound you need to be evaluated by podiatry or be seen in the emergency department.    ED Prescriptions    Medication Sig Dispense Auth. Provider   amoxicillin-clavulanate (AUGMENTIN) 875-125 MG tablet Take 1 tablet by mouth every 12 (twelve) hours. 14 tablet Becky Augustayan, Millianna Szymborski, NP   sulfamethoxazole-trimethoprim (BACTRIM DS) 800-160 MG tablet Take 1 tablet by mouth 2 (two) times daily for 7 days. 14 tablet Becky Augustayan, Suzane Vanderweide, NP     PDMP not reviewed this encounter.  Becky Augusta, NP 11/20/20 1247

## 2020-11-20 NOTE — ED Triage Notes (Addendum)
Pt c/o problem with his right foot, he denies any known injury. Pt reports pain and swelling to the right little toe, and some pain between the toes. Pt is concerned about a bug bite vs fungal infection. Pt does have DM. Pt has been using some topical creams with a little improvement.

## 2020-12-09 ENCOUNTER — Other Ambulatory Visit: Payer: Self-pay

## 2020-12-09 ENCOUNTER — Ambulatory Visit
Admission: EM | Admit: 2020-12-09 | Discharge: 2020-12-09 | Disposition: A | Discharge status: Home / Self Care | Payer: Self-pay | Attending: Emergency Medicine | Admitting: Emergency Medicine

## 2020-12-09 DIAGNOSIS — H401131 Primary open-angle glaucoma, bilateral, mild stage: Secondary | ICD-10-CM

## 2020-12-09 DIAGNOSIS — H538 Other visual disturbances: Secondary | ICD-10-CM

## 2020-12-09 DIAGNOSIS — R519 Headache, unspecified: Secondary | ICD-10-CM

## 2020-12-09 MED ORDER — LATANOPROST 0.005 % OP SOLN: 1.0000 [drp] | Freq: Every day | OPHTHALMIC | 12 refills | Status: DC

## 2020-12-09 NOTE — ED Provider Notes (Signed)
[facility]    CSN: [id number] Arrival date & time: 12/09/20  [id number]      History   Chief Complaint Chief Complaint  Patient presents with  . Headache    HPI Alexander Powers is a 55 y.o. male.   HPI   55 year old male here for evaluation of headache.  Patient reports that he has been having a frontal headache for the last 4 days and the headache has been constant.  He has taken Tylenol which has provided some relief.  He also reports that he has had some blurry vision and right ear pain.  Patient reports that lights make the pain worse and nothing seems to be relieving.  This is not associated with upper respiratory symptoms, fever, ringing in his ears, dizziness, vertigo, nausea, vomiting, numbness, tingling, or weakness.  Past Medical History:  Diagnosis Date  . Diabetes mellitus without complication (HCC)   . Difficult intubation   . GERD (gastroesophageal reflux disease)    OCC- TUMS  . Glaucoma   . Hypercholesteremia   . Hypertension   . Strep throat    01/24/18     Patient Active Problem List   Diagnosis Date Noted  . Acute strain of neck muscle 05/22/2020  . Neck pain 05/22/2020  . Acute torticollis 05/22/2020  . 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 Right groin hernia 08/03/2019  . Vitamin D deficiency 12/15/2018  . DM2 (diabetes mellitus, type 2) (HCC) 04/21/2018  . HTN (hypertension) 04/21/2018  . HLD (hyperlipidemia) 04/21/2018  . Glaucoma 04/21/2018  . Umbilical hernia without obstruction or gangrene     Past Surgical History:  Procedure Laterality Date  . HERNIA REPAIR    . INSERTION OF MESH N/A 10/22/2017   Procedure: INSERTION OF MESH;  Surgeon: Ancil Linseyavis, Jason Evan, MD;  Location: [facility] ORS;  Service: General;  Laterality: N/A;  . UMBILICAL HERNIA REPAIR N/A 10/22/2017   Procedure: HERNIA REPAIR UMBILICAL ADULT WITH MESH;  Surgeon: Ancil Linseyavis, Jason Evan, MD;  Location: [facility] ORS;  Service: General;  Laterality: N/A;       Home  Medications    Prior to Admission medications   Medication Sig Start Date End Date Taking? Authorizing Provider  albuterol (VENTOLIN HFA) 108 (90 Base) MCG/ACT inhaler Inhale 1-2 puffs into the lungs every 4 (four) hours as needed for wheezing or shortness of breath. 07/04/19  Yes McLean-Scocuzza, Pasty Spillersracy N, MD  amLODipine (NORVASC) 2.5 MG tablet Take 1 tablet (2.5 mg total) by mouth daily. 07/04/19  Yes McLean-Scocuzza, Pasty Spillersracy N, MD  cetirizine (ZYRTEC) 10 MG tablet Take 1 tablet (10 mg total) by mouth daily as needed for allergies. 07/04/19  Yes McLean-Scocuzza, Pasty Spillersracy N, MD  Cholecalciferol 1.25 MG (50000 UT) capsule Take 1 capsule (50,000 Units total) by mouth once a week. 12/15/18  Yes McLean-Scocuzza, Pasty Spillersracy N, MD  latanoprost (XALATAN) 0.005 % ophthalmic solution Place 1 drop into both eyes at bedtime. 12/09/20  Yes Becky Augustayan, Jamine Wingate, NP  losartan-hydrochlorothiazide (HYZAAR) 100-25 MG tablet Take 1 tablet by mouth daily. 07/04/19  Yes McLean-Scocuzza, Pasty Spillersracy N, MD  rosuvastatin (CRESTOR) 10 MG tablet Take 1 tablet (10 mg total) by mouth daily. 01/09/20  Yes McLean-Scocuzza, Pasty Spillersracy N, MD  Semaglutide,0.25 or 0.5MG /DOS, 2 MG/1.5ML SOPN Inject 0.375 mLs (0.5 mg total) into the skin every 7 (seven) days. 03/08/20  Yes McLean-Scocuzza, Pasty Spillersracy N, MD  acetaminophen (TYLENOL) 500 MG tablet Take 1 tablet (500 mg total) by mouth every 4 (four) hours as needed. 03/22/19   McLean-Scocuzza, Pasty Spillersracy N,  MD    Family History Family History  Problem Relation Age of Onset  . Diabetes Father   . Hyperlipidemia Father   . Hypertension Father   . Kidney disease Father   . Diabetes Paternal Uncle     Social History Social History   Tobacco Use  . Smoking status: Current Every Day Smoker    Packs/day: 0.50    Years: 27.00    Pack years: 13.50    Types: Cigarettes  . Smokeless tobacco: Never Used  . Tobacco comment: age [age] 1/2 ppd quit age 83   Vaping Use  . Vaping Use: Never used  Substance Use Topics  . Alcohol  use: Yes    Comment: occasionally  . Drug use: Not Currently    Types: Marijuana     Allergies   Patient has no known allergies.   Review of Systems Review of Systems  Constitutional: Negative for activity change, appetite change and fever.  HENT: Positive for ear pain. Negative for congestion, rhinorrhea and tinnitus.   Eyes: Positive for photophobia and visual disturbance. Negative for pain, discharge, redness and itching.  Gastrointestinal: Negative for nausea and vomiting.  Skin: Negative for rash.  Neurological: Positive for headaches. Negative for dizziness, syncope, speech difficulty, weakness, light-headedness and numbness.  Psychiatric/Behavioral: Negative.      Physical Exam Triage Vital Signs ED Triage Vitals  Enc Vitals Group     BP 12/09/20 0823 107/66     Pulse Rate 12/09/20 0823 67     Resp 12/09/20 0823 18     Temp 12/09/20 0823 98.1 F (36.7 C)     Temp Source 12/09/20 0823 Oral     SpO2 12/09/20 0823 100 %     Weight 12/09/20 0821 190 lb (86.2 kg)     Height 12/09/20 0821 5' 7.5" (1.715 m)     Head Circumference --      Peak Flow --      Pain Score 12/09/20 0821 8     Pain Loc --      Pain Edu? --      Excl. in GC? --    No data found.  Updated Vital Signs BP 107/66 (BP Location: Right Arm)   Pulse 67   Temp 98.1 F (36.7 C) (Oral)   Resp 18   Ht 5' 7.5" (1.715 m)   Wt 190 lb (86.2 kg)   SpO2 100%   BMI 29.32 kg/m   Visual Acuity Right Eye Distance:   Left Eye Distance:   Bilateral Distance:    Right Eye Near:   Left Eye Near:    Bilateral Near:     Physical Exam Vitals and nursing note reviewed.  Constitutional:      General: He is not in acute distress.    Appearance: Normal appearance. He is well-developed. He is not ill-appearing.  HENT:     Head: Normocephalic and atraumatic.     Right Ear: Tympanic membrane, ear canal and external ear normal. There is no impacted cerumen.     Left Ear: Tympanic membrane, ear canal and  external ear normal. There is no impacted cerumen.     Nose: Nose normal.     Mouth/Throat:     Mouth: Mucous membranes are moist.     Pharynx: Oropharynx is clear. No posterior oropharyngeal erythema.  Eyes:     General: No scleral icterus.       Right eye: No discharge.        Left eye:  No discharge.     Extraocular Movements: Extraocular movements intact.     Conjunctiva/sclera: Conjunctivae normal.     Pupils: Pupils are equal, round, and reactive to light.  Musculoskeletal:     Cervical back: Normal range of motion and neck supple.  Lymphadenopathy:     Cervical: No cervical adenopathy.  Skin:    General: Skin is warm and dry.     Capillary Refill: Capillary refill takes less than 2 seconds.  Neurological:     General: No focal deficit present.     Mental Status: He is alert and oriented to person, place, and time.     Cranial Nerves: No cranial nerve deficit.     Sensory: No sensory deficit.     Motor: No weakness.  Psychiatric:        Mood and Affect: Mood normal.        Behavior: Behavior normal.        Thought Content: Thought content normal.        Judgment: Judgment normal.      UC Treatments / Results  Labs (all labs ordered are listed, but only abnormal results are displayed) Labs Reviewed - No data to display  EKG   Radiology No results found.  Procedures Procedures (including critical care time)  Medications Ordered in UC Medications - No data to display  Initial Impression / Assessment and Plan / UC Course  I have reviewed the triage vital signs and the nursing notes.  Pertinent labs & imaging results that were available during my care of the patient were reviewed by me and considered in my medical decision making (see chart for details).   55 year old male here for evaluation of headache and blurry vision that has been going on for the past 4 days.  Additionally, he has had some right ear pain.  The headache pain is provoked by bright lights  and relieved by taking Tylenol.  Patient does have remote history of migraines as a child but he does not think this is the same presentation.  Patient denies any upper or lower respiratory symptoms.  No nausea or vomiting and no numbness, tingling, or weakness.  Patient physical exam reveals cranial nerves II through XII intact.  Upper extremity and lower extremity strength are 5/5 bilaterally.  No abnormalities to coordination.  Visual acuity reveals OU 20/40, OS 20/40, OD 20/50.  Patient has a history of glaucoma but has not seen ophthalmology in several years due to the pandemic and he has been out of his eyedrops for several months but he does not remember what he was taking.  Suspect patient's headache is related to his glaucoma and possibly increased intraocular pressure.  Will call [facility] to see if he can be seen today.  [facility] is unable to see patient today.  Called [facility] as patient has been seen there by Dr. Cleotis Nipper.  His last visit was 01/21/2019.  I have asked the clinic to the return my call or give the patient a call directly to be seen.  We will discharge patient home have him continue Tylenol for the headache pain as it is helping and have him follow-up with ophthalmology.  Resume Xalatan 0.005 1 drop OU nightly.   Final Clinical Impressions(s) / UC Diagnoses   Final diagnoses:  Frontal headache  Blurry vision, bilateral  Primary open angle glaucoma (POAG) of both eyes, mild stage     Discharge Instructions     Resume  your Xalatan drops, 1 drop each eye nightly for control of your glaucoma.  You may continue Tylenol as needed for headache pain.  I have called [facility] and left a message with the triage nurse.  I have given them your phone number that is in our records to call back to schedule an appointment.  Additionally, you may reach out to [facility] and their phone number is [phone].    ED Prescriptions     Medication Sig Dispense Auth. Provider   latanoprost (XALATAN) 0.005 % ophthalmic solution Place 1 drop into both eyes at bedtime. 2.5 mL Becky Augusta, NP     PDMP not reviewed this encounter.   Becky Augusta, NP 12/09/20 [phone]

## 2020-12-09 NOTE — ED Triage Notes (Signed)
Patient states that he has been having a headache x Thursday. States that he has noticed this makes him feel sleepy.

## 2020-12-09 NOTE — Discharge Instructions (Addendum)
Resume your Xalatan drops, 1 drop each eye nightly for control of your glaucoma.  You may continue Tylenol as needed for headache pain.  I have called [facility] and left a message with the triage nurse.  I have given them your phone number that is in our records to call back to schedule an appointment.  Additionally, you may reach out to [facility] and their phone number is [phone].

## 2021-03-11 ENCOUNTER — Encounter: Payer: Self-pay | Admitting: Emergency Medicine

## 2021-03-11 ENCOUNTER — Ambulatory Visit
Admission: EM | Admit: 2021-03-11 | Discharge: 2021-03-11 | Disposition: A | Discharge status: Home / Self Care | Payer: Self-pay | Attending: Physician Assistant | Admitting: Physician Assistant

## 2021-03-11 ENCOUNTER — Other Ambulatory Visit: Payer: Self-pay

## 2021-03-11 DIAGNOSIS — R42 Dizziness and giddiness: Secondary | ICD-10-CM | POA: Insufficient documentation

## 2021-03-11 DIAGNOSIS — F17200 Nicotine dependence, unspecified, uncomplicated: Secondary | ICD-10-CM | POA: Insufficient documentation

## 2021-03-11 LAB — GLUCOSE, CAPILLARY: Glucose-Capillary: 127 mg/dL — ABNORMAL HIGH (ref 70–99)

## 2021-03-11 MED ORDER — MECLIZINE HCL 25 MG PO TABS: 25.0000 mg | ORAL_TABLET | Freq: Three times a day (TID) | ORAL | 0 refills | Status: AC | PRN

## 2021-03-11 NOTE — ED Provider Notes (Signed)
[facility]    CSN: [id number] Arrival date & time: 03/11/21  0957      History   Chief Complaint Chief Complaint  Patient presents with   Dizziness    HPI Alexander Powers is a 55 y.o. male.   55 year old African-American male patient presents to emergency room chief complaint of dizziness.  States he has had intermittent episodes since Saturday.  Denies nausea vomiting diarrhea no chest pain or shortness of breath.  States he is COVID vaccinated, no fever.  The history is provided by the patient. No language interpreter was used.  Dizziness Quality:  Room spinning Onset quality:  Unable to specify Duration:  3 days Timing:  Intermittent Progression:  Waxing and waning Chronicity:  Recurrent Context: head movement   Relieved by:  Being still Worsened by:  Movement Ineffective treatments:  Fluids Associated symptoms: no chest pain, no hearing loss, no nausea, no palpitations and no tinnitus   Associated symptoms comment:  Right otalgia Risk factors: multiple medications    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   Right groin hernia 08/03/2019   Vitamin D deficiency 12/15/2018   DM2 (diabetes mellitus, type 2) (HCC) 04/21/2018   HTN (hypertension) 04/21/2018   HLD (hyperlipidemia) 04/21/2018   Glaucoma 04/21/2018   Umbilical hernia without obstruction or gangrene     Past Surgical History:  Procedure Laterality Date   HERNIA REPAIR     INSERTION OF MESH N/A 10/22/2017   Procedure: INSERTION OF MESH;  Surgeon: Ancil Linsey, MD;   Location: [facility];  Service: General;  Laterality: N/A;   UMBILICAL HERNIA REPAIR N/A 10/22/2017   Procedure: HERNIA REPAIR UMBILICAL ADULT WITH MESH;  Surgeon: Ancil Linsey, MD;  Location: [facility];  Service: General;  Laterality: N/A;       Home Medications    Prior to Admission medications   Medication Sig Start Date End Date Taking? Authorizing Provider  meclizine (ANTIVERT) 25 MG tablet Take 1 tablet (25 mg total) by mouth 3 (three) times daily as needed for dizziness. 03/11/21  Yes Joshu Furukawa, Para March, NP  acetaminophen (TYLENOL) 500 MG tablet Take 1 tablet (500 mg total) by mouth every 4 (four) hours as needed. 03/22/19   McLean-Scocuzza, Pasty Spillers, MD  albuterol (VENTOLIN HFA) 108 (90 Base) MCG/ACT inhaler Inhale 1-2 puffs into the lungs every 4 (four) hours as needed for wheezing or shortness of breath. 07/04/19   McLean-Scocuzza, Pasty Spillers, MD  amLODipine (NORVASC) 2.5 MG tablet Take 1 tablet (2.5 mg total) by mouth daily. 07/04/19   McLean-Scocuzza, Pasty Spillers, MD  cetirizine (ZYRTEC) 10 MG tablet Take 1 tablet (10 mg total) by mouth daily as needed for allergies. 07/04/19   McLean-Scocuzza, Pasty Spillers, MD  Cholecalciferol 1.25 MG (50000 UT) capsule Take 1 capsule (50,000 Units total) by mouth once a week. 12/15/18   McLean-Scocuzza, Pasty Spillers, MD  latanoprost (XALATAN) 0.005 % ophthalmic solution Place 1 drop into both eyes at bedtime. 12/09/20   Becky Augusta, NP  losartan-hydrochlorothiazide (HYZAAR) 100-25 MG tablet Take 1 tablet by mouth daily. 07/04/19   McLean-Scocuzza, Pasty Spillers, MD  rosuvastatin (CRESTOR) 10 MG tablet Take 1 tablet (10 mg total) by mouth daily. 01/09/20   McLean-Scocuzza, Pasty Spillers, MD  Semaglutide,0.25 or 0.5MG /DOS, 2 MG/1.5ML SOPN Inject 0.375 mLs (0.5 mg total) into the skin every 7 (seven) days. 03/08/20   McLean-Scocuzza, Pasty Spillers, MD    Family History Family History  Problem Relation Age of Onset   Diabetes Father    Hyperlipidemia Father    Hypertension Father     Kidney disease Father    Diabetes Paternal Uncle     Social History Social History   Tobacco Use   Smoking status: Every Day    Packs/day: 0.50    Years: 27.00    Pack years: 13.50    Types: Cigarettes   Smokeless tobacco: Never   Tobacco comments:    age 52-37 1/2 ppd quit age 89   Vaping Use   Vaping Use: Never used  Substance Use Topics   Alcohol use: Yes    Comment: occasionally   Drug use: Not Currently    Types: Marijuana     Allergies   Patient has no known allergies.   Review of Systems Review of Systems  Constitutional:  Negative for fever.  HENT:  Positive for ear pain. Negative for hearing loss and tinnitus.   Cardiovascular:  Negative for chest pain and palpitations.  Gastrointestinal:  Negative for nausea.  Neurological:  Positive for dizziness.  All other systems reviewed and are negative.   Physical Exam Triage Vital Signs ED Triage Vitals  Enc Vitals Group     BP 03/11/21 1150 (!) 144/80     Pulse Rate 03/11/21 1150 64     Resp 03/11/21 1150 18     Temp 03/11/21 1150 98.4 F (36.9 C)     Temp Source 03/11/21 1150 Oral     SpO2 03/11/21 1150 99 %     Weight --      Height --      Head Circumference --      Peak Flow --      Pain Score 03/11/21 1147 3     Pain Loc --      Pain Edu? --      Excl. in GC? --    No data found.  Updated Vital Signs BP (!) 144/80 (BP Location: Left Arm)   Pulse 64   Temp 98.4 F (36.9 C) (Oral)   Resp 18   SpO2 99%   Visual Acuity Right Eye Distance:   Left Eye Distance:   Bilateral Distance:    Right Eye Near:   Left Eye Near:    Bilateral Near:     Physical Exam Vitals and nursing note reviewed.  Constitutional:      General: He is not in acute distress.    Appearance: He is well-developed. He is not ill-appearing.  HENT:     Head: Normocephalic.     Jaw: No trismus.     Right Ear: No hemotympanum. Tympanic membrane is retracted.     Left Ear: No hemotympanum. Tympanic membrane is  retracted.     Nose: Mucosal edema and congestion present. No nasal deformity or septal deviation.     Right Sinus: No maxillary sinus tenderness.     Left Sinus: No maxillary sinus tenderness.     Mouth/Throat:     Lips: Pink.     Mouth: Mucous membranes  are moist.     Pharynx: Oropharynx is clear. Uvula midline. No oropharyngeal exudate or uvula swelling.  Eyes:     General: Lids are normal. Vision grossly intact.        Right eye: No discharge.        Left eye: No discharge.     Extraocular Movements: Extraocular movements intact.     Right eye: No nystagmus.     Left eye: No nystagmus.     Conjunctiva/sclera: Conjunctivae normal.     Right eye: Right conjunctiva is not injected. No hemorrhage.    Left eye: Left conjunctiva is not injected. No hemorrhage.    Pupils: Pupils are equal, round, and reactive to light. Pupils are equal.     Right eye: Pupil is round and reactive.     Left eye: Pupil is round and reactive.  Neck:     Trachea: Trachea normal. No tracheal deviation.  Cardiovascular:     Rate and Rhythm: Normal rate and regular rhythm.     Pulses: Normal pulses.     Heart sounds: Normal heart sounds.  Pulmonary:     Effort: Pulmonary effort is normal.     Breath sounds: Normal breath sounds.  Musculoskeletal:        General: Normal range of motion.     Cervical back: Normal range of motion.  Skin:    General: Skin is warm and dry.  Neurological:     General: No focal deficit present.     Mental Status: He is alert and oriented to person, place, and time.     GCS: GCS eye subscore is 4. GCS verbal subscore is 5. GCS motor subscore is 6.     Cranial Nerves: Cranial nerves are intact. No cranial nerve deficit.     Sensory: Sensation is intact. No sensory deficit.     Motor: Motor function is intact.     Coordination: Coordination is intact.     Gait: Gait is intact.  Psychiatric:        Attention and Perception: Attention normal.        Mood and Affect: Mood  normal.        Speech: Speech normal.        Behavior: Behavior normal. Behavior is cooperative.     UC Treatments / Results  Labs (all labs ordered are listed, but only abnormal results are displayed) Labs Reviewed  GLUCOSE, CAPILLARY - Abnormal; Notable for the following components:      Result Value   Glucose-Capillary 127 (*)    All other components within normal limits  CBG MONITORING, ED    EKG   Radiology No results found.  Procedures Procedures (including critical care time)  Medications Ordered in UC Medications - No data to display  Initial Impression / Assessment and Plan / UC Course  I have reviewed the triage vital signs and the nursing notes.  Pertinent labs & imaging results that were available during my care of the patient were reviewed by me and considered in my medical decision making (see chart for details).      Final Clinical Impressions(s) / UC Diagnoses   Final diagnoses:  Vertigo  Smoker     Discharge Instructions      IF SYMPTOMS ARE NOT IMPROVED WITH MEDICATION WILL NEED TO FOLLOW UP WITH EAR NOSE AND THROAT(ENT) SPECIALIST. IF YOU DEVELOP SHORTNESS OF BREATH, CHEST PAIN,PALPITATIONS, VOMITING, ETC  GO STRAIGHT TO ER/CALL 9-1-1. STOP SMOKING. YOUR BP WAS ELEVATED IN  UC TODAY, PLEASE KEEP AN EYE ON IT .     ED Prescriptions     Medication Sig Dispense Auth. Provider   meclizine (ANTIVERT) 25 MG tablet Take 1 tablet (25 mg total) by mouth 3 (three) times daily as needed for dizziness. 30 tablet Teigan Manner, Para March, NP      PDMP not reviewed this encounter.   Clancy Gourd, NP 03/11/21 1332

## 2021-03-11 NOTE — Discharge Instructions (Addendum)
IF SYMPTOMS ARE NOT IMPROVED WITH MEDICATION WILL NEED TO FOLLOW UP WITH EAR NOSE AND THROAT(ENT) SPECIALIST. IF YOU DEVELOP SHORTNESS OF BREATH, CHEST PAIN,PALPITATIONS, VOMITING, ETC  GO STRAIGHT TO ER/CALL 9-1-1. STOP SMOKING. YOUR BP WAS ELEVATED IN UC TODAY, PLEASE KEEP AN EYE ON IT .

## 2021-03-11 NOTE — ED Triage Notes (Signed)
Pt presents today with dizziness. He said he was sitting in barber chair on Saturday when the room started spinning. He also left work today after dizziness. Denies n/v/d. Denies Chest pain or SOB. He has had intermittent right ear pain.

## 2021-05-27 ENCOUNTER — Ambulatory Visit
Admission: EM | Admit: 2021-05-27 | Discharge: 2021-05-27 | Disposition: A | Discharge status: Home / Self Care | Payer: Self-pay | Attending: Emergency Medicine | Admitting: Emergency Medicine

## 2021-05-27 ENCOUNTER — Other Ambulatory Visit: Payer: Self-pay

## 2021-05-27 DIAGNOSIS — L03031 Cellulitis of right toe: Secondary | ICD-10-CM

## 2021-05-27 DIAGNOSIS — M7918 Myalgia, other site: Secondary | ICD-10-CM

## 2021-05-27 LAB — GLUCOSE, CAPILLARY: Glucose-Capillary: 101 mg/dL — ABNORMAL HIGH (ref 70–99)

## 2021-05-27 MED ORDER — AMOXICILLIN-POT CLAVULANATE 875-125 MG PO TABS
1.0000 | ORAL_TABLET | Freq: Two times a day (BID) | ORAL | 0 refills | Status: DC
  Filled 2021-05-27: qty 14, 7d supply, fill #0

## 2021-05-27 MED ORDER — SULFAMETHOXAZOLE-TRIMETHOPRIM 800-160 MG PO TABS
1.0000 | ORAL_TABLET | Freq: Two times a day (BID) | ORAL | 0 refills | Status: AC
  Filled 2021-05-27: qty 14, 7d supply, fill #0

## 2021-05-27 NOTE — ED Provider Notes (Addendum)
[facility]    CSN: [id number] Arrival date & time: 05/27/21  1202      History   Chief Complaint Chief Complaint  Patient presents with   Back Pain   Foot Pain    r    HPI Alexander Powers is a 55 y.o. male.   HPI  55 year old male here for evaluation of multiple complaints.  Patient reports that he has been having pain in his right flank off and on for the past 3 weeks.  He does not remember any inciting activity and he does not know of anything that can trigger it and will just come and go.  When he does have pain he has taken Tylenol and that has helped pain resolve.  He states he is concerned about his kidney but he denies any increase in urination, decrease in urination, painful urination, blood in his urine.  The other concern he has is right foot pain that has been ongoing since April.  At that time he presented with an erythematous and edematous right fifth toe.  He had some breakdown between the toes as well.  He was treated with oral antibiotics Augmentin and Bactrim for 7 days.  He reports that he is continuing to have the same issues.  He reports that he is washing his feet every day and that his shoes are not ill fitting.  He denies fevers.  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   Right groin hernia 08/03/2019   Vitamin D deficiency 12/15/2018   DM2 (diabetes mellitus, type 2) (HCC) 04/21/2018   HTN (hypertension) 04/21/2018   HLD (hyperlipidemia) 04/21/2018   Glaucoma 04/21/2018   Umbilical hernia without obstruction or gangrene      Past Surgical History:  Procedure Laterality Date   HERNIA REPAIR     INSERTION OF MESH N/A 10/22/2017   Procedure: INSERTION OF MESH;  Surgeon: Ancil Linsey, MD;  Location: [facility];  Service: General;  Laterality: N/A;   UMBILICAL HERNIA REPAIR N/A 10/22/2017   Procedure: HERNIA REPAIR UMBILICAL ADULT WITH MESH;  Surgeon: Ancil Linsey, MD;  Location: [facility];  Service: General;  Laterality: N/A;       Home Medications    Prior to Admission medications   Medication Sig Start Date End Date Taking? Authorizing Provider  amoxicillin-clavulanate (AUGMENTIN) 875-125 MG tablet Take 1 tablet by mouth every 12 (twelve) hours. 05/27/21  Yes Becky Augusta, NP  sulfamethoxazole-trimethoprim (BACTRIM DS) 800-160 MG tablet Take 1 tablet by mouth 2 (two) times daily for 7 days. 05/27/21 06/03/21 Yes Becky Augusta, NP  acetaminophen (TYLENOL) 500 MG tablet Take 1 tablet (500 mg total) by mouth every 4 (four) hours as needed. 03/22/19   McLean-Scocuzza, Pasty Spillers, MD  albuterol (VENTOLIN HFA) 108 (90 Base) MCG/ACT inhaler Inhale 1-2 puffs into the lungs every 4 (four) hours as needed for wheezing or shortness of breath. 07/04/19   McLean-Scocuzza, Pasty Spillers, MD  amLODipine (NORVASC) 2.5 MG tablet Take 1 tablet (2.5 mg  total) by mouth daily. 07/04/19   McLean-Scocuzza, Pasty Spillers, MD  cetirizine (ZYRTEC) 10 MG tablet Take 1 tablet (10 mg total) by mouth daily as needed for allergies. 07/04/19   McLean-Scocuzza, Pasty Spillers, MD  Cholecalciferol 1.25 MG (50000 UT) capsule Take 1 capsule (50,000 Units total) by mouth once a week. 12/15/18   McLean-Scocuzza, Pasty Spillers, MD  latanoprost (XALATAN) 0.005 % ophthalmic solution Place 1 drop into both eyes at bedtime. 12/09/20   Becky Augusta, NP  losartan-hydrochlorothiazide (HYZAAR) 100-25 MG tablet Take 1 tablet by mouth daily. 07/04/19   McLean-Scocuzza, Pasty Spillers, MD  meclizine (ANTIVERT) 25 MG tablet Take 1 tablet (25 mg total) by mouth 3 (three) times daily as needed  for dizziness. 03/11/21   Defelice, Para March, NP  rosuvastatin (CRESTOR) 10 MG tablet Take 1 tablet (10 mg total) by mouth daily. 01/09/20   McLean-Scocuzza, Pasty Spillers, MD  Semaglutide,0.25 or 0.5MG /DOS, 2 MG/1.5ML SOPN Inject 0.375 mLs (0.5 mg total) into the skin every 7 (seven) days. 03/08/20   McLean-Scocuzza, Pasty Spillers, MD    Family History Family History  Problem Relation Age of Onset   Diabetes Father    Hyperlipidemia Father    Hypertension Father    Kidney disease Father    Diabetes Paternal Uncle     Social History Social History   Tobacco Use   Smoking status: Every Day    Packs/day: 0.50    Years: 27.00    Pack years: 13.50    Types: Cigarettes   Smokeless tobacco: Never   Tobacco comments:    age 5-37 1/2 ppd quit age 77   Vaping Use   Vaping Use: Never used  Substance Use Topics   Alcohol use: Yes    Comment: occasionally   Drug use: Not Currently    Types: Marijuana     Allergies   Patient has no known allergies.   Review of Systems Review of Systems  Constitutional:  Negative for activity change, appetite change and fever.  Genitourinary:  Negative for dysuria, frequency, hematuria and urgency.  Musculoskeletal:  Positive for back pain.  Skin:  Positive for color change. Negative for rash and wound.  Hematological: Negative.   Psychiatric/Behavioral: Negative.      Physical Exam Triage Vital Signs ED Triage Vitals  Enc Vitals Group     BP 05/27/21 1316 (!) 141/82     Pulse Rate 05/27/21 1316 72     Resp 05/27/21 1316 18     Temp 05/27/21 1316 98.1 F (36.7 C)     Temp Source 05/27/21 1316 Oral     SpO2 05/27/21 1316 99 %     Weight 05/27/21 1312 180 lb (81.6 kg)     Height 05/27/21 1312 5\' 7"  (1.702 m)     Head Circumference --      Peak Flow --      Pain Score 05/27/21 1311 7     Pain Loc --      Pain Edu? --      Excl. in GC? --    No data found.  Updated Vital Signs BP (!) 141/82 (BP Location: Left Arm)   Pulse 72   Temp 98.1 F  (36.7 C) (Oral)   Resp 18   Ht 5\' 7"  (1.702 m)   Wt 180 lb (81.6 kg)   SpO2 99%   BMI 28.19 kg/m   Visual Acuity Right Eye Distance:   Left Eye Distance:   Bilateral Distance:    Right Eye  Near:   Left Eye Near:    Bilateral Near:     Physical Exam Vitals and nursing note reviewed.  Constitutional:      General: He is not in acute distress.    Appearance: Normal appearance. He is not ill-appearing.  HENT:     Head: Normocephalic and atraumatic.  Cardiovascular:     Rate and Rhythm: Normal rate and regular rhythm.     Pulses: Normal pulses.     Heart sounds: Normal heart sounds. No murmur heard.   No gallop.  Pulmonary:     Effort: Pulmonary effort is normal.     Breath sounds: Normal breath sounds. No wheezing, rhonchi or rales.  Abdominal:     Tenderness: There is no right CVA tenderness or left CVA tenderness.  Skin:    General: Skin is warm and dry.     Capillary Refill: Capillary refill takes less than 2 seconds.     Findings: Erythema present. No lesion or rash.  Neurological:     General: No focal deficit present.     Mental Status: He is alert and oriented to person, place, and time.  Psychiatric:        Mood and Affect: Mood normal.        Behavior: Behavior normal.        Thought Content: Thought content normal.        Judgment: Judgment normal.     UC Treatments / Results  Labs (all labs ordered are listed, but only abnormal results are displayed) Labs Reviewed  GLUCOSE, CAPILLARY - Abnormal; Notable for the following components:      Result Value   Glucose-Capillary 101 (*)    All other components within normal limits  CBG MONITORING, ED    EKG   Radiology No results found.  Procedures Procedures (including critical care time)  Medications Ordered in UC Medications - No data to display  Initial Impression / Assessment and Plan / UC Course  I have reviewed the triage vital signs and the nursing notes.  Pertinent labs & imaging  results that were available during my care of the patient were reviewed by me and considered in my medical decision making (see chart for details).  Patient is a nontoxic-appearing 55 year old male here for evaluation of right flank pain that comes and goes over the past 3 weeks and right foot pain that has been present for several months.  Patient is not currently having his right flank pain and he indicates its around to the side just above his hip.  There is no muscle spasm with palpation of the flank and is not tender to touch.  Cardiopulmonary exam reveals clear lung sounds in all fields.  No CVA tenderness on exam.  Patient's right foot has some erythema to all of his toes without induration, fluctuance, or heat.  In the webspaces between the fourth and fifth toe and the third and fourth toe the tissue is macerated but intact.  There is no drainage.  Patient denies having any wet feet or working in water.  There is no induration or erythema of the underlying tissue in that area or the top of the foot.  No edema appreciated.  DP and PT pulses are 2+.  Suspect patient is not taking care of his feet as well as he indicates he is.  I have advised him that he needs to wash his feet nightly, dry them thoroughly, and apply lotion to help keep the skin supple.  I have also advised him to use cotton balls placed between his toes so that the webspaces can get open to the air and help those areas dry out and prevent the maceration of tissue.  We will place patient back on Augmentin and Bactrim for double coverage for prevention of infection given his diabetic status.  Patient also indicates that he has been out of his Ozempic for 2 weeks.  Will check fingerstick blood sugar.  Fingerstick blood sugars 101.  Patient's flank pain is not there presently and I believe is musculoskeletal in nature.  I will have the patient continue his Tylenol therapy as he has been since he is getting good relief with this therapy.  The  patient's foot is concerning for developing cellulitis because all of his toes are red though they are not hot or swollen.  They are tender to touch.  The skin between the toes is macerated which indicates that his feet remaining wet which makes him more prone to infection given his diabetic status.  I have encouraged him to remove his shoes after working today and wash and dry his feet.  I have also encouraged him to apply lotion to keep the skin supple.  I will direct him to keep cotton balls between his toes at night to help get airflow to that area of his body and prevent maceration of the tissue and infection from setting in.  We will cover him with dual therapy antibiotics of Bactrim and Augmentin and have him follow-up with his primary care provider for a recheck towards the end of his antibiotic therapy.  Of also encouraged him to make an appointment with his primary care provider to discuss refills of his medications.   Final Clinical Impressions(s) / UC Diagnoses   Final diagnoses:  Musculoskeletal pain  Cellulitis of toe of right foot     Discharge Instructions      For your musculoskeletal flank pain: Continue to use Tylenol as you have been since this has been working for your pain.  For your right foot pain: Your toes are red and this may be of indication of early cellulitis so I am going to treat you with dual therapy antibiotics.  Take the Augmentin and Bactrim twice daily for 7 days.  Take these with food.  Make sure that you are removing her shoes at the end of the day so that your feet can get air to them and dry out.  Also make sure that you are washing your feet daily and apply lotion to your feet to keep the skin supple.  The area between your toes is collecting moisture which is causing the skin to macerate.  I recommend separating your toes with cotton balls to allow airflow so the skin can dry out and prevent further infection.  Make an appoint with your primary  care provider to be reevaluated towards the end of your antibiotic therapy and also to discuss refills of your medications.     ED Prescriptions     Medication Sig Dispense Auth. Provider   amoxicillin-clavulanate (AUGMENTIN) 875-125 MG tablet Take 1 tablet by mouth every 12 (twelve) hours. 14 tablet Becky Augusta, NP   sulfamethoxazole-trimethoprim (BACTRIM DS) 800-160 MG tablet Take 1 tablet by mouth 2 (two) times daily for 7 days. 14 tablet Becky Augusta, NP      PDMP not reviewed this encounter.   Becky Augusta, NP 05/27/21 1427    Becky Augusta, NP 05/27/21 1430

## 2021-05-27 NOTE — ED Triage Notes (Signed)
Pt c/o right-sided back pain for about 3 weeks. Pt denies any known injury, heavy lifting or other inciting factors. Pt also c/o continued right foot pain, pt was seen 11/2020 for same issue, states this has not improved.

## 2021-05-27 NOTE — Discharge Instructions (Addendum)
For your musculoskeletal flank pain: Continue to use Tylenol as you have been since this has been working for your pain.  For your right foot pain: Your toes are red and this may be of indication of early cellulitis so I am going to treat you with dual therapy antibiotics.  Take the Augmentin and Bactrim twice daily for 7 days.  Take these with food.  Make sure that you are removing your shoes at the end of the day so that your feet can get air to them and dry out.  Also make sure that you are washing your feet daily and apply lotion to your feet to keep the skin supple.  The area between your toes is collecting moisture which is causing the skin to macerate.  I recommend separating your toes with cotton balls to allow airflow so the skin can dry out and prevent further infection.  Make an appoint with your primary care provider to be reevaluated towards the end of your antibiotic therapy and also to discuss refills of your medications.

## 2021-05-28 ENCOUNTER — Telehealth: Payer: Self-pay | Admitting: Internal Medicine

## 2021-05-28 DIAGNOSIS — I10 Essential (primary) hypertension: Secondary | ICD-10-CM

## 2021-05-28 DIAGNOSIS — E785 Hyperlipidemia, unspecified: Secondary | ICD-10-CM

## 2021-05-28 DIAGNOSIS — J309 Allergic rhinitis, unspecified: Secondary | ICD-10-CM

## 2021-05-28 MED ORDER — LOSARTAN POTASSIUM-HCTZ 100-25 MG PO TABS: 1.0000 | ORAL_TABLET | Freq: Every day | ORAL | 0 refills | Status: DC

## 2021-05-28 MED ORDER — AMLODIPINE BESYLATE 2.5 MG PO TABS: 2.5000 mg | ORAL_TABLET | Freq: Every day | ORAL | 0 refills | Status: DC

## 2021-05-28 MED ORDER — LATANOPROST 0.005 % OP SOLN: 1.0000 [drp] | Freq: Every day | OPHTHALMIC | 1 refills | Status: DC

## 2021-05-28 MED ORDER — CETIRIZINE HCL 10 MG PO TABS: 10.0000 mg | ORAL_TABLET | Freq: Every day | ORAL | 1 refills | Status: AC | PRN

## 2021-05-28 MED ORDER — ROSUVASTATIN CALCIUM 10 MG PO TABS: 10.0000 mg | ORAL_TABLET | Freq: Every day | ORAL | 0 refills | Status: DC

## 2021-05-28 NOTE — Telephone Encounter (Signed)
Patient needs the following medications refilled; amLODipine (NORVASC) 2.5 MG tablet, cetirizine (ZYRTEC) 10 MG tablet, losartan-hydrochlorothiazide (HYZAAR) 100-25 MG tablet, rosuvastatin (CRESTOR) 10 MG tablet,latanoprost (XALATAN) 0.005 % ophthalmic solution.

## 2021-05-30 ENCOUNTER — Other Ambulatory Visit: Payer: Self-pay

## 2021-06-11 DIAGNOSIS — E119 Type 2 diabetes mellitus without complications: Secondary | ICD-10-CM

## 2021-06-11 LAB — GLUCOSE, POCT (MANUAL RESULT ENTRY): POC Glucose: 145 mg/dl — AB (ref 70–99)

## 2021-06-11 NOTE — Congregational Nurse Program (Signed)
  Dept: [phone]   Congregational Nurse Program Note  Date of Encounter: 06/11/2021  Past Medical History: Past Medical History:  Diagnosis Date   Diabetes mellitus without complication (HCC)    Difficult intubation    GERD (gastroesophageal reflux disease)    OCC- TUMS   Glaucoma    Hypercholesteremia    Hypertension    Strep throat    01/24/18     Encounter Details:  CNP Questionnaire - 06/11/21 2346       Questionnaire   Do you give verbal consent to treat you today? Yes    Location Patient Served  Not Applicable    Visit Setting Church or Organization    Patient Status Unknown    Insurance Unknown    Insurance Referral N/A    Medication N/A    Medical Provider Yes   Dr.McClain   Screening Referrals Annual Wellness Visit    Medical Referral [facility]    Medical Appointment Made N/A    Food Have Food Insecurities    Transportation N/A    Housing/Utilities N/A    Interpersonal Safety N/A    Intervention Blood glucose    ED Visit Averted N/A    Life-Saving Intervention Made N/A            client seen at nurse only screening clinic [facility] requesting a glucose screen. States he is a known diabetic. Doesn't monitor at home. Taking meds as prescribed. Ate within the past hour for 1pm appointment. Reinforced appropriate nutrition and disease management and to RTC in 1 wk for monitoring. Routine well man exams with primary care provider. Rhermann, RN

## 2021-09-22 ENCOUNTER — Ambulatory Visit
Admission: EM | Admit: 2021-09-22 | Discharge: 2021-09-22 | Disposition: A | Discharge status: Home / Self Care | Payer: Self-pay | Attending: Internal Medicine | Admitting: Internal Medicine

## 2021-09-22 ENCOUNTER — Other Ambulatory Visit: Payer: Self-pay

## 2021-09-22 ENCOUNTER — Encounter: Payer: Self-pay | Admitting: Emergency Medicine

## 2021-09-22 DIAGNOSIS — K529 Noninfective gastroenteritis and colitis, unspecified: Secondary | ICD-10-CM

## 2021-09-22 MED ORDER — ONDANSETRON 4 MG PO TBDP: 4.0000 mg | ORAL_TABLET | Freq: Three times a day (TID) | ORAL | 0 refills | Status: AC | PRN

## 2021-09-22 NOTE — ED Provider Notes (Signed)
[facility]    CSN: [id number] Arrival date & time: 09/22/21  1638      History   Chief Complaint Chief Complaint  Patient presents with   Emesis   Diarrhea    HPI Alexander Powers is a 56 y.o. male comes to the urgent care with 1 day history of diarrhea bowel movements and vomiting which started at 3 AM today.  Patient recalls eating some chicken wings, fries and a dip last night.  Few hours after that patient started having diarrhea and vomiting.  Patient has had several episodes of nonbloody nonmucoid diarrhea.  Patient has had 2 episodes of nonbloody nonbilious vomiting.  No abdominal pain no distention.  Last bowel movement was around 1 PM.  Patient is tolerating some ginger ale and crackers.  No fever or chills.  Patient endorses that his grandchild had diarrhea a week ago.  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   Right groin hernia 08/03/2019   Vitamin D deficiency 12/15/2018   DM2 (diabetes mellitus, type 2) (HCC) 04/21/2018   HTN (hypertension) 04/21/2018   HLD (hyperlipidemia) 04/21/2018   Glaucoma 04/21/2018   Umbilical hernia without obstruction or gangrene     Past Surgical History:  Procedure Laterality Date   HERNIA REPAIR     INSERTION OF MESH N/A 10/22/2017   Procedure: INSERTION OF MESH;  Surgeon: Ancil Linsey, MD;  Location: [facility] ORS;  Service: General;  Laterality: N/A;   UMBILICAL HERNIA REPAIR N/A 10/22/2017   Procedure: HERNIA REPAIR UMBILICAL ADULT WITH  MESH;  Surgeon: Ancil Linsey, MD;  Location: [facility] ORS;  Service: General;  Laterality: N/A;       Home Medications    Prior to Admission medications   Medication Sig Start Date End Date Taking? Authorizing Provider  acetaminophen (TYLENOL) 500 MG tablet Take 1 tablet (500 mg total) by mouth every 4 (four) hours as needed. 03/22/19  Yes McLean-Scocuzza, Pasty Spillers, MD  albuterol (VENTOLIN HFA) 108 (90 Base) MCG/ACT inhaler Inhale 1-2 puffs into the lungs every 4 (four) hours as needed for wheezing or shortness of breath. 07/04/19  Yes McLean-Scocuzza, Pasty Spillers, MD  amLODipine (NORVASC) 2.5 MG tablet Take 1 tablet (2.5 mg total) by mouth daily. 05/28/21  Yes McLean-Scocuzza, Pasty Spillers, MD  cetirizine (ZYRTEC) 10 MG tablet Take 1 tablet (10 mg total) by mouth daily as needed for allergies. 05/28/21  Yes McLean-Scocuzza, Pasty Spillers, MD  Cholecalciferol 1.25 MG (50000 UT) capsule Take 1 capsule (50,000 Units total) by mouth once a week. 12/15/18  Yes McLean-Scocuzza, Pasty Spillers, MD  latanoprost (XALATAN) 0.005 % ophthalmic solution Place 1 drop into both eyes at bedtime. 05/28/21  Yes McLean-Scocuzza, Pasty Spillers, MD  losartan-hydrochlorothiazide (HYZAAR) 100-25 MG tablet Take 1 tablet by mouth daily. 05/28/21  Yes McLean-Scocuzza, Pasty Spillers, MD  meclizine (ANTIVERT) 25 MG tablet Take 1 tablet (25 mg total) by mouth 3 (three) times daily as needed for  dizziness. 03/11/21  Yes Defelice, Para March, NP  ondansetron (ZOFRAN-ODT) 4 MG disintegrating tablet Take 1 tablet (4 mg total) by mouth every 8 (eight) hours as needed for nausea or vomiting. 09/22/21  Yes Edker Punt, Britta Mccreedy, MD  rosuvastatin (CRESTOR) 10 MG tablet Take 1 tablet (10 mg total) by mouth daily. 05/28/21  Yes McLean-Scocuzza, Pasty Spillers, MD  Semaglutide,0.25 or 0.5MG /DOS, 2 MG/1.5ML SOPN Inject 0.375 mLs (0.5 mg total) into the skin every 7 (seven) days. 03/08/20  Yes McLean-Scocuzza, Pasty Spillers, MD    Family History Family History  Problem Relation Age of Onset    Diabetes Father    Hyperlipidemia Father    Hypertension Father    Kidney disease Father    Diabetes Paternal Uncle     Social History Social History   Tobacco Use   Smoking status: Every Day    Packs/day: 0.50    Years: 27.00    Pack years: 13.50    Types: Cigarettes   Smokeless tobacco: Never   Tobacco comments:    age 65-37 1/2 ppd quit age 89   Vaping Use   Vaping Use: Never used  Substance Use Topics   Alcohol use: Yes    Comment: occasionally   Drug use: Not Currently    Types: Marijuana     Allergies   Patient has no known allergies.   Review of Systems Review of Systems  Constitutional: Negative.   HENT: Negative.    Gastrointestinal:  Positive for diarrhea, nausea and vomiting. Negative for abdominal pain.  Genitourinary: Negative.   Musculoskeletal: Negative.     Physical Exam Triage Vital Signs ED Triage Vitals  Enc Vitals Group     BP 09/22/21 1742 (!) 164/88     Pulse Rate 09/22/21 1742 66     Resp 09/22/21 1742 18     Temp 09/22/21 1742 98.8 F (37.1 C)     Temp Source 09/22/21 1742 Oral     SpO2 09/22/21 1742 98 %     Weight 09/22/21 1739 179 lb 14.3 oz (81.6 kg)     Height 09/22/21 1739 5\' 7"  (1.702 m)     Head Circumference --      Peak Flow --      Pain Score 09/22/21 1739 0     Pain Loc --      Pain Edu? --      Excl. in [place]? --    No data found.  Updated Vital Signs BP (!) 169/97 (BP Location: Right Arm)    Pulse 66    Temp 98.8 F (37.1 C) (Oral)    Resp 18    Ht 5\' 7"  (1.702 m)    Wt 81.6 kg    SpO2 98%    BMI 28.18 kg/m   Visual Acuity Right Eye Distance:   Left Eye Distance:   Bilateral Distance:    Right Eye Near:   Left Eye Near:    Bilateral Near:     Physical Exam Vitals and nursing note reviewed.  Constitutional:      General: He is not in acute distress.    Appearance: He is not ill-appearing.  Cardiovascular:     Rate and Rhythm: Normal rate and regular rhythm.     Pulses: Normal pulses.     Heart  sounds: Normal heart sounds.  Pulmonary:     Effort: Pulmonary effort is normal.     Breath sounds: Normal breath sounds.  Abdominal:     General: Bowel sounds  are normal.     Palpations: Abdomen is soft.  Neurological:     Mental Status: He is alert.     UC Treatments / Results  Labs (all labs ordered are listed, but only abnormal results are displayed) Labs Reviewed - No data to display  EKG   Radiology No results found.  Procedures Procedures (including critical care time)  Medications Ordered in UC Medications - No data to display  Initial Impression / Assessment and Plan / UC Course  I have reviewed the triage vital signs and the nursing notes.  Pertinent labs & imaging results that were available during my care of the patient were reviewed by me and considered in my medical decision making (see chart for details).     1.  Acute gastroenteritis: Increase oral fluid intake Zofran as needed for nausea No indication for stool testing If symptoms persist please return to urgent care to be reevaluated. Final Clinical Impressions(s) / UC Diagnoses   Final diagnoses:  Acute gastroenteritis     Discharge Instructions      Please increase oral fluid intake Take medications as prescribed If symptoms worsen please return to urgent care to be reevaluated.   ED Prescriptions     Medication Sig Dispense Auth. Provider   ondansetron (ZOFRAN-ODT) 4 MG disintegrating tablet Take 1 tablet (4 mg total) by mouth every 8 (eight) hours as needed for nausea or vomiting. 20 tablet Kambry Takacs, Britta Mccreedy, MD      PDMP not reviewed this encounter.   Merrilee Jansky, MD 09/22/21 [phone]

## 2021-09-22 NOTE — ED Triage Notes (Signed)
Pt c/o diarrhea, vomiting. Started this morning about 3 am. Denies fever or abdominal pain. Pt last episode of vomiting was about 1:00.

## 2021-09-22 NOTE — Discharge Instructions (Addendum)
Please increase oral fluid intake Take medications as prescribed If symptoms worsen please return to urgent care to be reevaluated. 

## 2021-09-25 ENCOUNTER — Other Ambulatory Visit: Payer: Self-pay

## 2021-09-25 ENCOUNTER — Ambulatory Visit: Admission: EM | Admit: 2021-09-25 | Discharge: 2021-09-25 | Payer: Self-pay

## 2021-09-25 DIAGNOSIS — H409 Unspecified glaucoma: Secondary | ICD-10-CM

## 2021-09-25 DIAGNOSIS — H538 Other visual disturbances: Secondary | ICD-10-CM

## 2021-09-25 DIAGNOSIS — R519 Headache, unspecified: Secondary | ICD-10-CM

## 2021-09-25 NOTE — ED Notes (Addendum)
Patient is being discharged from the Urgent Care and sent to the Emergency Department via POV . Per Alycia Rossetti, NP, patient is in need of higher level of care due to further evaluation for eye pressure. Patient is aware and verbalizes understanding of plan of care.  Vitals:   09/25/21 1645  BP: (!) 147/72  Pulse: 79  Resp: 16  Temp: 98.6 F (37 C)  SpO2: 99%

## 2021-09-25 NOTE — Discharge Instructions (Addendum)
Please go to the emergency department at [facility] to be evaluated for your headache and have your eye pressures checked to ensure that your headache is not a result of glaucoma and increased pressure inside your eyeball.

## 2021-09-25 NOTE — ED Provider Notes (Signed)
[facility]    CSN: [id number] Arrival date & time: 09/25/21  1625      History   Chief Complaint Chief Complaint  Patient presents with   Headache    HPI Alexander Powers is a 56 y.o. male.   HPI  56 year old male here for evaluation of headache.  Patient reports that he has been experiencing headaches since approximately 3 AM this morning.  He states he woke up with it and has been continuous.  It is on the right side of his head and travels from his temple back through the parietal and occipital areas.  This has not been associated with nausea or vomiting but it has been associated with blurred vision.  Patient is concerned that he may be developing migraines.  Patient denies any previous history of migraines.  Patient does have a history of glaucoma and he has been out of his Xalatan eyedrops for 3 weeks.  Both the patient's eyes are red.  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   Right groin hernia 08/03/2019   Vitamin D deficiency 12/15/2018   DM2 (diabetes mellitus, type 2) ([place]) 04/21/2018   HTN (hypertension) 04/21/2018   HLD (hyperlipidemia) 04/21/2018   Glaucoma [id number]   Umbilical hernia without obstruction or gangrene     Past Surgical History:  Procedure Laterality Date   HERNIA REPAIR     INSERTION OF MESH N/A 10/22/2017   Procedure: INSERTION OF MESH;  Surgeon: Vickie Epley, MD;  Location: [facility] ORS;  Service: General;  Laterality: N/A;   UMBILICAL HERNIA REPAIR N/A 10/22/2017   Procedure: HERNIA REPAIR UMBILICAL ADULT WITH  MESH;  Surgeon: Vickie Epley, MD;  Location: [facility] ORS;  Service: General;  Laterality: N/A;       Home Medications    Prior to Admission medications   Medication Sig Start Date End Date Taking? Authorizing Provider  acetaminophen (TYLENOL) 500 MG tablet Take 1 tablet (500 mg total) by mouth every 4 (four) hours as needed. 03/22/19   McLean-Scocuzza, Nino Glow, MD  albuterol (VENTOLIN HFA) 108 (90 Base) MCG/ACT inhaler Inhale 1-2 puffs into the lungs every 4 (four) hours as needed for wheezing or shortness of breath. 07/04/19   McLean-Scocuzza, Nino Glow, MD  amLODipine (NORVASC) 2.5 MG tablet Take 1 tablet (2.5 mg total) by mouth daily. 05/28/21   McLean-Scocuzza, Nino Glow, MD  cetirizine (ZYRTEC) 10 MG tablet Take 1 tablet (10 mg total) by mouth daily as needed for allergies. 05/28/21   McLean-Scocuzza, Nino Glow, MD  Cholecalciferol 1.25 MG (50000 UT) capsule Take 1 capsule (50,000 Units total) by mouth once a week. 12/15/18   McLean-Scocuzza, Nino Glow, MD  latanoprost (XALATAN) 0.005 % ophthalmic solution Place 1 drop into both eyes at bedtime. 05/28/21   McLean-Scocuzza, Nino Glow, MD  losartan-hydrochlorothiazide (HYZAAR) 100-25 MG tablet Take 1 tablet by mouth daily. 05/28/21   McLean-Scocuzza, Nino Glow, MD  meclizine (ANTIVERT) 25 MG tablet Take 1 tablet (25 mg total) by mouth 3 (three)  times daily as needed for dizziness. [id number]   Defelice, Jeanett Schlein, NP  ondansetron (ZOFRAN-ODT) 4 MG disintegrating tablet Take 1 tablet (4 mg total) by mouth every 8 (eight) hours as needed for nausea or vomiting. 09/22/21   Lamptey, Myrene Galas, MD  rosuvastatin (CRESTOR) 10 MG tablet Take 1 tablet (10 mg total) by mouth daily. 05/28/21   McLean-Scocuzza, Nino Glow, MD  Semaglutide,0.25 or 0.5MG /DOS, 2 MG/1.5ML SOPN Inject 0.375 mLs (0.5 mg total) into the skin every 7 (seven) days. 03/08/20   McLean-Scocuzza, Nino Glow, MD    Family History Family History  Problem Relation Age of Onset   Diabetes Father     Hyperlipidemia Father    Hypertension Father    Kidney disease Father    Diabetes Paternal Uncle     Social History Social History   Tobacco Use   Smoking status: Every Day    Packs/day: 0.50    Years: 27.00    Pack years: 13.50    Types: Cigarettes   Smokeless tobacco: Never   Tobacco comments:    age [age] 1/2 ppd quit age 36   Vaping Use   Vaping Use: Never used  Substance Use Topics   Alcohol use: Yes    Comment: occasionally   Drug use: Not Currently    Types: Marijuana     Allergies   Patient has no known allergies.   Review of Systems Review of Systems  Eyes:  Positive for redness and visual disturbance. Negative for pain.  Gastrointestinal:  Negative for nausea and vomiting.  Skin:  Negative for rash.  Neurological:  Positive for headaches. Negative for facial asymmetry and weakness.  Hematological: Negative.   Psychiatric/Behavioral: Negative.      Physical Exam Triage Vital Signs ED Triage Vitals  Enc Vitals Group     BP 09/25/21 1645 (!) 147/72     Pulse Rate 09/25/21 1645 79     Resp 09/25/21 1645 16     Temp 09/25/21 1645 98.6 F (37 C)     Temp Source 09/25/21 1645 Oral     SpO2 09/25/21 1645 99 %     Weight --      Height --      Head Circumference --      Peak Flow --      Pain Score 09/25/21 1646 4     Pain Loc --      Pain Edu? --      Excl. in [place]? --    No data found.  Updated Vital Signs BP (!) 147/72 (BP Location: Left Arm)    Pulse 79    Temp 98.6 F (37 C) (Oral)    Resp 16    SpO2 99%   Visual Acuity Right Eye Distance:   Left Eye Distance:   Bilateral Distance:    Right Eye Near:   Left Eye Near:    Bilateral Near:     Physical Exam Vitals and nursing note reviewed.  Constitutional:      General: He is not in acute distress.    Appearance: Normal appearance. He is not ill-appearing.  HENT:     Head: Normocephalic and atraumatic.  Eyes:     Extraocular Movements: Extraocular movements intact.     Pupils:  Pupils are equal, round, and reactive to light.     Comments: Patient has muddy sclera and both eyes also are mildly erythematous.  Pupils are equal round and reactive.  EOM is intact.  Skin:  General: Skin is warm and dry.     Capillary Refill: Capillary refill takes less than 2 seconds.     Findings: No erythema or rash.  Neurological:     General: No focal deficit present.     Mental Status: He is alert and oriented to person, place, and time.  Psychiatric:        Mood and Affect: Mood normal.        Behavior: Behavior normal.        Thought Content: Thought content normal.        Judgment: Judgment normal.     UC Treatments / Results  Labs (all labs ordered are listed, but only abnormal results are displayed) Labs Reviewed - No data to display  EKG   Radiology No results found.  Procedures Procedures (including critical care time)  Medications Ordered in UC Medications - No data to display  Initial Impression / Assessment and Plan / UC Course  I have reviewed the triage vital signs and the nursing notes.  Pertinent labs & imaging results that were available during my care of the patient were reviewed by me and considered in my medical decision making (see chart for details).  Patient is a 56 year old nontoxic-appearing male here for evaluation of right-sided headache that started about 3 AM this morning and has been present ever since.  He states that this headache is associated with blurriness in his vision but he denies any nausea or vomiting.  He does not have any history of headaches or migraines.  Patient does have a history of glaucoma and has been out of his Xalatan eyedrops for the past 3 weeks.  He has not seen his ophthalmologist at [facility] since before [place].  On exam patient's eyes are mildly erythematous and he has muddy sclera.  His pupils are equal round and reactive and his EOMs intact.  Patient has no facial asymmetry and he denies any nausea or vomiting.   Given patient's history of glaucoma I am concerned that his headache is actually coming from increased intraocular pressure.  I have messaged Dr. Edison Pace, the ophthalmologist on-call for [facility], to discuss the case.  His recommendation is to send the patient to [facility] emergency department since he is a patient of [facility] ophthalmology.  He does have his eye pressures checked and possibly be restarted on his eyedrops for glaucoma.   Final Clinical Impressions(s) / UC Diagnoses   Final diagnoses:  Acute nonintractable headache, unspecified headache type  Blurred vision, bilateral  Glaucoma of both eyes, unspecified glaucoma type     Discharge Instructions      Please go to the emergency department at [facility] to be evaluated for your headache and have your eye pressures checked to ensure that your headache is not a result of glaucoma and increased pressure inside your eyeball.     ED Prescriptions   None    PDMP not reviewed this encounter.   Margarette Canada, NP 09/25/21 1717

## 2021-09-25 NOTE — ED Triage Notes (Signed)
Patient presents to Urgent Care with complaints of headache since last night. Pt was seen 02/13 for diarrhea and n/v states these symptoms have eased off. Concerned with a migraine. Treating pain with Tylenol last dose 1300.

## 2021-10-08 ENCOUNTER — Other Ambulatory Visit: Payer: Self-pay

## 2021-10-08 ENCOUNTER — Ambulatory Visit (INDEPENDENT_AMBULATORY_CARE_PROVIDER_SITE_OTHER): Payer: Self-pay | Admitting: Internal Medicine

## 2021-10-08 ENCOUNTER — Encounter: Payer: Self-pay | Admitting: Internal Medicine

## 2021-10-08 VITALS — BP 160/84 | HR 81 | Temp 98.0°F | Ht 67.0 in | Wt 187.4 lb

## 2021-10-08 DIAGNOSIS — I152 Hypertension secondary to endocrine disorders: Secondary | ICD-10-CM

## 2021-10-08 DIAGNOSIS — B353 Tinea pedis: Secondary | ICD-10-CM | POA: Insufficient documentation

## 2021-10-08 DIAGNOSIS — B356 Tinea cruris: Secondary | ICD-10-CM | POA: Insufficient documentation

## 2021-10-08 DIAGNOSIS — E1165 Type 2 diabetes mellitus with hyperglycemia: Secondary | ICD-10-CM

## 2021-10-08 DIAGNOSIS — Z125 Encounter for screening for malignant neoplasm of prostate: Secondary | ICD-10-CM

## 2021-10-08 DIAGNOSIS — E559 Vitamin D deficiency, unspecified: Secondary | ICD-10-CM

## 2021-10-08 DIAGNOSIS — E785 Hyperlipidemia, unspecified: Secondary | ICD-10-CM

## 2021-10-08 DIAGNOSIS — R21 Rash and other nonspecific skin eruption: Secondary | ICD-10-CM

## 2021-10-08 DIAGNOSIS — I10 Essential (primary) hypertension: Secondary | ICD-10-CM

## 2021-10-08 DIAGNOSIS — L309 Dermatitis, unspecified: Secondary | ICD-10-CM

## 2021-10-08 DIAGNOSIS — E1159 Type 2 diabetes mellitus with other circulatory complications: Secondary | ICD-10-CM

## 2021-10-08 MED ORDER — LOSARTAN POTASSIUM-HCTZ 100-25 MG PO TABS
1.0000 | ORAL_TABLET | Freq: Every day | ORAL | 3 refills | Status: DC
  Filled 2021-10-08: qty 90, 90d supply, fill #0
  Filled 2021-10-08: qty 30, 30d supply, fill #0

## 2021-10-08 MED ORDER — FLUCONAZOLE 150 MG PO TABS
150.0000 mg | ORAL_TABLET | ORAL | 0 refills | Status: DC
Start: 2021-10-08 — End: 2022-02-11
  Filled 2021-10-08 (×2): qty 6, 42d supply, fill #0

## 2021-10-08 MED ORDER — SEMAGLUTIDE(0.25 OR 0.5MG/DOS) 2 MG/1.5ML ~~LOC~~ SOPN
0.5000 mg | PEN_INJECTOR | SUBCUTANEOUS | 3 refills | Status: DC
  Filled 2021-10-08 (×2): qty 1.5, 28d supply, fill #0

## 2021-10-08 MED ORDER — LATANOPROST 0.005 % OP SOLN
1.0000 [drp] | Freq: Every day | OPHTHALMIC | 1 refills | Status: AC
  Filled 2021-10-08: qty 2.5, 25d supply, fill #0
  Filled 2021-10-08: qty 2.5, 50d supply, fill #0

## 2021-10-08 MED ORDER — CLOTRIMAZOLE 1 % EX OINT
1.0000 "application " | TOPICAL_OINTMENT | Freq: Two times a day (BID) | CUTANEOUS | 0 refills | Status: AC
  Filled 2021-10-08: qty 60, fill #0

## 2021-10-08 MED ORDER — CLOTRIMAZOLE-BETAMETHASONE 1-0.05 % EX CREA
1.0000 | TOPICAL_CREAM | Freq: Every day | CUTANEOUS | 2 refills | Status: DC
Start: 2021-10-08 — End: 2021-10-09
  Filled 2021-10-08: qty 45, 45d supply, fill #0
  Filled 2021-10-08: qty 45, 30d supply, fill #0

## 2021-10-08 MED ORDER — SEMAGLUTIDE(0.25 OR 0.5MG/DOS) 2 MG/1.5ML ~~LOC~~ SOPN
0.5000 mg | PEN_INJECTOR | SUBCUTANEOUS | 3 refills | Status: DC
  Filled 2021-10-08: qty 1.5, 28d supply, fill #0

## 2021-10-08 MED ORDER — ROSUVASTATIN CALCIUM 10 MG PO TABS
10.0000 mg | ORAL_TABLET | Freq: Every day | ORAL | 0 refills | Status: DC
Start: 2021-10-08 — End: 2021-10-08
  Filled 2021-10-08: qty 90, 90d supply, fill #0

## 2021-10-08 MED ORDER — AMLODIPINE BESYLATE 2.5 MG PO TABS
2.5000 mg | ORAL_TABLET | Freq: Every day | ORAL | 0 refills | Status: DC
Start: 2021-10-08 — End: 2021-10-08
  Filled 2021-10-08: qty 90, 90d supply, fill #0

## 2021-10-08 MED ORDER — TRIAMCINOLONE ACETONIDE 0.1 % EX CREA
1.0000 | TOPICAL_CREAM | Freq: Two times a day (BID) | CUTANEOUS | 2 refills | Status: AC
Start: 2021-10-08 — End: ?
  Filled 2021-10-08: qty 454, 227d supply, fill #0
  Filled 2021-10-08: qty 454, 30d supply, fill #0

## 2021-10-08 MED ORDER — LOSARTAN POTASSIUM-HCTZ 100-25 MG PO TABS
1.0000 | ORAL_TABLET | Freq: Every day | ORAL | 0 refills | Status: DC
  Filled 2021-10-08: qty 90, 90d supply, fill #0

## 2021-10-08 MED ORDER — AMLODIPINE BESYLATE 2.5 MG PO TABS
2.5000 mg | ORAL_TABLET | Freq: Every day | ORAL | 3 refills | Status: DC
Start: 2021-10-08 — End: 2021-12-23
  Filled 2021-10-08: qty 30, 30d supply, fill #0
  Filled 2021-10-08: qty 90, 90d supply, fill #0

## 2021-10-08 MED ORDER — CHOLECALCIFEROL 1.25 MG (50000 UT) PO CAPS
50000.0000 [IU] | ORAL_CAPSULE | ORAL | 1 refills | Status: AC
Start: 2021-10-08 — End: ?
  Filled 2021-10-08: qty 13, 91d supply, fill #0

## 2021-10-08 MED ORDER — HYDROCORTISONE 2.5 % EX CREA
TOPICAL_CREAM | Freq: Two times a day (BID) | CUTANEOUS | 0 refills | Status: AC
  Filled 2021-10-08: qty 56, 30d supply, fill #0

## 2021-10-08 MED ORDER — ROSUVASTATIN CALCIUM 10 MG PO TABS
10.0000 mg | ORAL_TABLET | Freq: Every day | ORAL | 3 refills | Status: DC
  Filled 2021-10-08: qty 30, 30d supply, fill #0
  Filled 2021-10-08: qty 90, 90d supply, fill #0

## 2021-10-08 NOTE — Progress Notes (Signed)
Chief Complaint  ?Patient presents with  ? Follow-up  ? Rash  ?  Itching rash on the right flank, groin, and the right foot.   ? ?F/u  ?1. 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 Htn not taking bp meds norvasc 2.5, losartan hctz 100/25 ?-uncontrolled dm 2 and htn  ? ?3. Hld on crestor 10 mg qd  ?4. Glaucoma not seen eye md in almost 3 years at [facility] due to lack of health insurance  ?5. C/o rash right flank itchy, rash right web of toes itchy and white and does sweat on feet went to walk in clinic they told him to use lotion and rash right testicle ? ? ?Review of Systems  ?Constitutional:  Negative for weight loss.  ?HENT:  Negative for hearing loss.   ?Eyes:  Negative for blurred vision.  ?Respiratory:  Negative for shortness of breath.   ?Cardiovascular:  Negative for chest pain.  ?Gastrointestinal:  Negative for abdominal pain and blood in stool.  ?Musculoskeletal:  Negative for back pain.  ?Skin:  Negative for rash.  ?Neurological:  Negative for headaches.  ?Psychiatric/Behavioral:  Negative for depression.   ?Past Medical History:  ?Diagnosis Date  ? Diabetes mellitus without complication ([place])   ? Difficult intubation   ? GERD (gastroesophageal reflux disease)   ? OCC- TUMS  ? Glaucoma   ? Hypercholesteremia   ? Hypertension   ? Strep throat   ? 01/24/18   ? ?Past Surgical History:  ?Procedure Laterality Date  ? HERNIA REPAIR    ? INSERTION OF MESH N/A 10/22/2017  ? Procedure: INSERTION OF MESH;  Surgeon: Vickie Epley, MD;  Location: [facility] ORS;  Service: General;  Laterality: N/A;  ? UMBILICAL HERNIA REPAIR N/A 10/22/2017  ? Procedure: HERNIA REPAIR UMBILICAL ADULT WITH MESH;  Surgeon: Vickie Epley, MD;  Location: [facility] OR[facility];  Service: General;  Laterality: N/A;  ? ?Family History  ?Problem Relation Age of Onset  ? Diabetes Father   ? Hyperlipidemia Father   ? Hypertension Father   ? Kidney disease Father   ? Diabetes Paternal Uncle   ? ?Social  History  ? ?Socioeconomic History  ? Marital status: Single  ?  Spouse name: Not on file  ? Number of children: Not on file  ? Years of education: Not on file  ? Highest education level: Not on file  ?Occupational History  ? Not on file  ?Tobacco Use  ? Smoking status: Every Day  ?  Packs/day: 0.50  ?  Years: 27.00  ?  Pack years: 13.50  ?  Types: Cigarettes  ? Smokeless tobacco: Never  ? Tobacco comments:  ?  age [age] 1/2 ppd quit age 15   ?Vaping Use  ? Vaping Use: Never used  ?Substance and Sexual Activity  ? Alcohol use: Yes  ?  Comment: occasionally  ? Drug use: Not Currently  ?  Types: Marijuana  ? Sexual activity: Not on file  ?Other Topics Concern  ? Not on file  ?Social History Narrative  ? GED, Glass blower/designer   ? No guns   ? Wears seat belt   ? Safe in relationship engaged to [employer]   ? Works [employer]  ? -out of work since 04/2019 on disability   ?   ? Lives with girlfriend Doroteo Bradford  ? ?Social Determinants of Health  ? ?Financial Resource Strain:  Not on file  ?Food Insecurity: Not on file  ?Transportation Needs: Not on file  ?Physical Activity: Not on file  ?Stress: Not on file  ?Social Connections: Not on file  ?Intimate Partner Violence: Not on file  ? ?Current Meds  ?Medication Sig  ? acetaminophen (TYLENOL) 500 MG tablet Take 1 tablet (500 mg total) by mouth every 4 (four) hours as needed.  ? Clotrimazole 1 % OINT Apply 1 application topically 2 (two) times daily. Right groin  ? clotrimazole-betamethasone (LOTRISONE) cream Apply 1 application topically once daily.  ? fluconazole (DIFLUCAN) 150 MG tablet Take 1 tablet (150 mg total) by mouth once a week.  ? hydrocortisone 2.5 % cream Apply topically 2 (two) times daily as needed to right groin  ? triamcinolone cream (KENALOG) 0.1 % Apply 1 application topically 2 (two) times daily as needed to right abdomen.  ? [DISCONTINUED] amLODipine (NORVASC) 2.5 MG tablet Take 1 tablet (2.5 mg total) by mouth daily.  ? [DISCONTINUED] latanoprost  (XALATAN) 0.005 % ophthalmic solution Place 1 drop into both eyes at bedtime.  ? [DISCONTINUED] losartan-hydrochlorothiazide (HYZAAR) 100-25 MG tablet Take 1 tablet by mouth daily.  ? [DISCONTINUED] rosuvastatin (CRESTOR) 10 MG tablet Take 1 tablet (10 mg total) by mouth daily.  ? ?No Known Allergies ?No results found for this or any previous visit (from the past 2160 hour(s)). ?Objective  ?Body mass index is 29.35 kg/m?. ?Wt Readings from Last 3 Encounters:  ?10/08/21 187 lb 6.4 oz (85 kg)  ?09/22/21 179 lb 14.3 oz (81.6 kg)  ?05/27/21 180 lb (81.6 kg)  ? ?Temp Readings from Last 3 Encounters:  ?10/08/21 98 ?F (36.7 ?C) (Oral)  ?09/25/21 98.6 ?F (37 ?C) (Oral)  ?09/22/21 98.8 ?F (37.1 ?C) (Oral)  ? ?BP Readings from Last 3 Encounters:  ?10/08/21 (!) 160/84  ?09/25/21 (!) 147/72  ?09/22/21 (!) 169/97  ? ?Pulse Readings from Last 3 Encounters:  ?10/08/21 81  ?09/25/21 79  ?09/22/21 66  ? ? ?Physical Exam ?Vitals and nursing note reviewed.  ?Constitutional:   ?   Appearance: Normal appearance. He is well-developed and well-groomed.  ?HENT:  ?   Head: Normocephalic and atraumatic.  ?Eyes:  ?   Conjunctiva/sclera: Conjunctivae normal.  ?   Pupils: Pupils are equal, round, and reactive to light.  ?Cardiovascular:  ?   Rate and Rhythm: Normal rate and regular rhythm.  ?   Heart sounds: Normal heart sounds.  ?Pulmonary:  ?   Effort: Pulmonary effort is normal. No respiratory distress.  ?   Breath sounds: Normal breath sounds.  ?Abdominal:  ?   Tenderness: There is no abdominal tenderness.  ?Skin: ?   General: Skin is warm and moist.  ?Neurological:  ?   General: No focal deficit present.  ?   Mental Status: He is alert and oriented to person, place, and time. Mental status is at baseline.  ?   Sensory: Sensation is intact.  ?   Motor: Motor function is intact.  ?   Coordination: Coordination is intact.  ?   Gait: Gait is intact. Gait normal.  ?Psychiatric:     ?   Attention and Perception: Attention and perception normal.      ?   Mood and Affect: Mood and affect normal.     ?   Speech: Speech normal.     ?   Behavior: Behavior normal. Behavior is cooperative.     ?   Thought Content: Thought content normal.     ?  Cognition and Memory: Cognition and memory normal.     ?   Judgment: Judgment normal.  ? ? ?Assessment  ?Plan  ?Hypertension associated with diabetes ([place]) - Plan: Comprehensive metabolic panel, Hemoglobin A1c, CBC with Differential/Platelet ?Losartan-hydrochlorothiazide (HYZAAR) 100-25 MG tablet, amLODipine (NORVASC) 2.5 MG tablet, Comprehensive metabolic panel, Hemoglobin A1c, CBC with Differential/Platelet,  ? ?Hyperlipidemia, unspecified hyperlipidemia type - Plan: rosuvastatin (CRESTOR) 10 MG tablet,  ? ?Type 2 diabetes mellitus with hyperglycemia, without long-term current use of insulin ([place]) - Plan: Semaglutide,0.25 or 0.5MG/DOS, 2 MG/1.5ML SOPN ? ? ?Rash right flank ?Eczema  ?[place] ? ?Tinea pedis of right foot - Plan: clotrimazole-betamethasone (LOTRISONE) cream, fluconazole (DIFLUCAN) 150 MG tablet ? ?Jock itch - Plan: fluconazole (DIFLUCAN) 150 MG tablet, Clotrimazole 1 % OINT, hydrocortisone 2.5 % cream ? ?Eczema, unspecified type - Plan: triamcinolone cream (KENALOG) 0.1 % ? ?Vitamin D deficiency - Plan: Cholecalciferol 1.25 MG (50000 UT) capsule ? ?Prostate cancer screening - Plan: PSA  ? ?HM ?Declines flu shot ?Tdap per pt had at [facility] and get records  ?-if cant find will need again  ?Consider shingrix vaccine in future ?Consider pna 23 in future   ?MMR immune ?Consider hep B vaccine  ?PSA do today ?Colonoscopy in future currently no insurance  ?  ?Provider: Dr. Olivia Mackie McLean-Scocuzza-Internal Medicine  ?

## 2021-10-08 NOTE — Patient Instructions (Addendum)
Go to social security office to try get health insurance  Gold bond antifungal to keep you dry over the counter to foot  Lotrisone to foot daily  Take diflucan 1x per week x 1 month to 6 weeks  Triamcinolone to abdomen    Clotrimazole and hydrocortisone 2.5% right groin area in private area   Hypertension, Adult Hypertension is another name for high blood pressure. High blood pressure forces your heart to work harder to pump blood. This can cause problems over time. There are two numbers in a blood pressure reading. There is a top number (systolic) over a bottom number (diastolic). It is best to have a blood pressure that is below 120/80. Healthy choices can help lower your blood pressure, or you may need medicine to help lower it. What are the causes? The cause of this condition is not known. Some conditions may be related to high blood pressure. What increases the risk? Smoking. Having type 2 diabetes mellitus, high cholesterol, or both. Not getting enough exercise or physical activity. Being overweight. Having too much fat, sugar, calories, or salt (sodium) in your diet. Drinking too much alcohol. Having long-term (chronic) kidney disease. Having a family history of high blood pressure. Age. Risk increases with age. Race. You may be at higher risk if you are African American. Gender. Men are at higher risk than women before age 34. After age 66, women are at higher risk than men. Having obstructive sleep apnea. Stress. What are the signs or symptoms? High blood pressure may not cause symptoms. Very high blood pressure (hypertensive crisis) may cause: Headache. Feelings of worry or nervousness (anxiety). Shortness of breath. Nosebleed. A feeling of being sick to your stomach (nausea). Throwing up (vomiting). Changes in how you see. Very bad chest pain. Seizures. How is this treated? This condition is treated by making healthy lifestyle changes, such as: Eating healthy  foods. Exercising more. Drinking less alcohol. Your health care provider may prescribe medicine if lifestyle changes are not enough to get your blood pressure under control, and if: Your top number is above 130. Your bottom number is above 80. Your personal target blood pressure may vary. Follow these instructions at home: Eating and drinking  If told, follow the DASH eating plan. To follow this plan: Fill one half of your plate at each meal with fruits and vegetables. Fill one fourth of your plate at each meal with whole grains. Whole grains include whole-wheat pasta, brown rice, and whole-grain bread. Eat or drink low-fat dairy products, such as skim milk or low-fat yogurt. Fill one fourth of your plate at each meal with low-fat (lean) proteins. Low-fat proteins include fish, chicken without skin, eggs, beans, and tofu. Avoid fatty meat, cured and processed meat, or chicken with skin. Avoid pre-made or processed food. Eat less than 1,500 mg of salt each day. Do not drink alcohol if: Your doctor tells you not to drink. You are pregnant, may be pregnant, or are planning to become pregnant. If you drink alcohol: Limit how much you use to: 0-1 drink a day for women. 0-2 drinks a day for men. Be aware of how much alcohol is in your drink. In the [place] one drink equals one 12 oz bottle of beer (355 mL), one 5 oz glass of wine (148 mL), or one 1 oz glass of hard liquor (44 mL). Lifestyle  Work with your doctor to stay at a healthy weight or to lose weight. Ask your doctor what the best weight is  for you. Get at least 30 minutes of exercise most days of the week. This may include walking, swimming, or biking. Get at least 30 minutes of exercise that strengthens your muscles (resistance exercise) at least 3 days a week. This may include lifting weights or doing Pilates. Do not use any products that contain nicotine or tobacco, such as cigarettes, e-cigarettes, and chewing tobacco. If you  need help quitting, ask your doctor. Check your blood pressure at home as told by your doctor. Keep all follow-up visits as told by your doctor. This is important. Medicines Take over-the-counter and prescription medicines only as told by your doctor. Follow directions carefully. Do not skip doses of blood pressure medicine. The medicine does not work as well if you skip doses. Skipping doses also puts you at risk for problems. Ask your doctor about side effects or reactions to medicines that you should watch for. Contact a doctor if you: Think you are having a reaction to the medicine you are taking. Have headaches that keep coming back (recurring). Feel dizzy. Have swelling in your ankles. Have trouble with your vision. Get help right away if you: Get a very bad headache. Start to feel mixed up (confused). Feel weak or numb. Feel faint. Have very bad pain in your: Chest. Belly (abdomen). Throw up more than once. Have trouble breathing. Summary Hypertension is another name for high blood pressure. High blood pressure forces your heart to work harder to pump blood. For most people, a normal blood pressure is less than 120/80. Making healthy choices can help lower blood pressure. If your blood pressure does not get lower with healthy choices, you may need to take medicine. This information is not intended to replace advice given to you by your health care provider. Make sure you discuss any questions you have with your health care provider. Document Revised: 04/06/2018 Document Reviewed: 04/06/2018 Elsevier Patient Education  2022 [employer]  Eczema Eczema refers to a group of skin conditions that cause skin to become rough and inflamed. Each type of eczema has different triggers, symptoms, and treatments. Eczema of any type is usually itchy. Symptoms range from mild to severe. Eczema is not spread from person to person (is not contagious). It can appear on different parts of the  body at different times. One person's eczema may look different from another person's eczema. What are the causes? The exact cause of this condition is not known. However, exposure to certain environmental factors, irritants, and allergens can make the condition worse. What are the signs or symptoms? Symptoms of this condition depend on the type of eczema you have. The types include: Contact dermatitis. There are two kinds: Irritant contact dermatitis. This happens when something irritates the skin and causes a rash. Allergic contact dermatitis. This happens when your skin comes in contact with something you are allergic to (allergens). This can include poison ivy, chemicals, or medicines that were applied to your skin. Atopic dermatitis. This is a long-term (chronic) skin disease that keeps coming back (recurring). It is the most common type of eczema. Usual symptoms are a red rash and itchy, dry, scaly skin. It usually starts showing signs in infancy and can last through adulthood. Dyshidrotic eczema. This is a form of eczema on the hands and feet. It shows up as very itchy, fluid-filled blisters. It can affect people of any age but is more common before age 98. Hand eczema. This causes very itchy areas of skin on the palms and sides of  the hands and fingers. This type of eczema is common in industrial jobs where you may be exposed to different types of irritants. Lichen simplex chronicus. This type of eczema occurs when a person constantly scratches one area of the body. Repeated scratching of the area leads to thickened skin (lichenification). This condition can accompany other types of eczema. It is more common in adults but may also be seen in children. Nummular eczema. This is a common type of eczema that most often affects the lower legs and the backs of the hands. It typically causes an itchy, red, circular, crusty lesion (plaque). Scratching may become a habit and can cause bleeding. Nummular  eczema occurs most often in middle-aged or older people. Seborrheic dermatitis. This is a common skin disease that mainly affects the scalp. It may also affect other oily areas of the body, such as the face, sides of the nose, eyebrows, ears, eyelids, and chest. It is marked by small scaling and redness of the skin (erythema). This can affect people of all ages. In infants, this condition is called cradle cap. Stasis dermatitis. This is a common skin disease that can cause itching, scaling, and hyperpigmentation, usually on the legs and feet. It occurs most often in people who have a condition that prevents blood from being pumped through the veins in the legs (chronic venous insufficiency). Stasis dermatitis is a chronic condition that needs long-term management. How is this diagnosed? This condition may be diagnosed based on: A physical exam of your skin. Your medical history. Skin patch tests. These tests involve using patches that contain possible allergens and placing them on your back. Your health care provider will check in a few days to see if an allergic reaction occurred. How is this treated? Treatment for eczema is based on the type of eczema you have. You may be given hydrocortisone steroid medicine or antihistamines. These can relieve itching quickly and help reduce inflammation. These may be prescribed or purchased over the counter, depending on the strength that is needed. Follow these instructions at home: Take or apply over-the-counter and prescription medicines only as told by your health care provider. Use creams or ointments to moisturize your skin. Do not use lotions. Learn what triggers or irritates your symptoms so you can avoid these things. Treat symptom flare-ups quickly. Do not scratch your skin. This can make your rash worse. Keep all follow-up visits. This is important. Where to find more information American Academy of Dermatology: MarketingSheets.siaad.org National Eczema Association:  nationaleczema.org The Society for Pediatric Dermatology: pedsderm.net Contact a health care provider if: You have severe itching, even with treatment. You scratch your skin regularly until it bleeds. Your rash looks different than usual. Your skin is painful, swollen, or more red than usual. You have a fever. Summary Eczema refers to a group of skin conditions that cause skin to become rough and inflamed. Each type has different triggers. Eczema of any type causes itching that may range from mild to severe. Treatment varies based on the type of eczema you have. Hydrocortisone steroid medicine or antihistamines can help with itching and inflammation. Protecting your skin is the best way to prevent eczema. Use creams or ointments to moisturize your skin. Avoid triggers and irritants. Treat flare-ups quickly. This information is not intended to replace advice given to you by your health care provider. Make sure you discuss any questions you have with your health care provider. Document Revised: 05/06/2020 Document Reviewed: 05/06/2020 Elsevier Patient Education  2022 [employer]  Jock Itch Jock itch (tinea cruris) is an infection of the skin in the groin area that is caused by a fungus. Jock itch causes an itchy rash in the groin and upper thigh area. It usually goes away in 2-3 weeks with treatment. What are the causes? The fungus that causes jock itch may be spread by: Touching a fungal infection elsewhere on your body, such as athlete's foot, and then touching your groin area. Sharing towels or clothing, such as socks or shoes, with someone who has a fungal infection. What increases the risk? Jock itch is most common in men and adolescent boys. You are also more likely to develop the condition if you: Are in a hot, humid climate. Wear tight-fitting clothing or wet bathing suits for long periods of time. Play sports. Are overweight. Have diabetes. Have a weakened body defense system  (immune system). Sweat a lot. What are the signs or symptoms? Symptoms of jock itch may include: A red, pink, or brown rash in the groin area. Blisters may be present. The rash may spread to the thighs, the opening between the buttocks (anus), and the buttocks. Dry and scaly skin on or around the rash. Itchiness. How is this diagnosed? In most cases, your health care provider can make the diagnosis by looking at your rash. In some cases, a sample of infected skin may be scraped off. This sample may be examined under a microscope (biopsy) or by trying to grow the fungus from the sample (culture). How is this treated? Treatment for this condition may include: Antifungal medicine to kill the fungus. This may be a skin cream, ointment, or powder, or it may be a medicine that you take by mouth (orally). Skin cream or ointment to reduce itching. Lifestyle changes, such as wearing looser clothing and caring for your skin. Follow these instructions at home: Skin care Apply skin creams, ointments, or powders exactly as told by your health care provider. Wear loose-fitting clothing that does not rub against your groin area. Men should wear boxer shorts or loose-fitting underwear. Keep your groin area clean and dry. Change your underwear every day. Change out of wet bathing suits as soon as possible. After bathing, use a separate towel to gently dry your groin area thoroughly. Using a separate towel will help prevent spreading the infection to other parts of your body. Avoid hot baths and showers. Hot water can make itching worse. Do not scratch the affected area. General instructions Take and apply over-the-counter and prescription medicines only as told by your health care provider. Do not share towels, clothing, or personal items with other people. Wash your hands often with soap and water for at least 20 seconds, especially after touching your groin area. If soap and water are not available, use  hand sanitizer. When at the gym: Always wear shoes, especially in the shower and around the swimming pool. Keep any cuts covered. Disinfect any mats or equipment before using them. Shower immediately after working out. Keep all follow-up visits. This is important. Contact a health care provider if: Your rash: Gets worse or does not get better after 2 weeks of treatment. Spreads. Returns after treatment is finished. You have any of the following: A fever. New or worsening redness, swelling, or pain around your rash. Fluid, blood, or pus coming from your rash. Summary Jock itch (tinea cruris) is a fungal infection of the skin in the groin area. The fungus can be spread by sharing clothing or by touching a fungus  infection elsewhere on your body and then touching your groin area. Treatment may include antifungal medicine and lifestyle changes, such as keeping the area clean and dry. This information is not intended to replace advice given to you by your health care provider. Make sure you discuss any questions you have with your health care provider. Document Revised: 10/15/2020 Document Reviewed: 10/15/2020 Elsevier Patient Education  2022 Elsevier Inc.   Athlete's Foot Athlete's foot (tinea pedis) is a fungal infection of the skin on your feet. It often occurs on the skin that is between or underneath the toes. It can also occur on the soles of your feet. The infection can spread from person to person (is contagious). It can also spread when a person's bare feet come in contact with the fungus on shower floors or on items such as shoes. What are the causes? This condition is caused by a fungus that grows in warm, moist places. You can get athlete's foot by sharing shoes, shower stalls, towels, and wet floors with someone who is infected. Not washing your feet or changing your socks often enough can also lead to athlete's foot. What increases the risk? This condition is more likely to  develop in: Men. People who have a weak body defense system (immune system). People who have diabetes. People who use public showers, such as at a gym. People who wear heavy-duty shoes, such as [employer] workerindustrial or military shoes. Seasons with warm, humid weather. What are the signs or symptoms? Symptoms of this condition include: Itchy areas between your toes or on the soles of your feet. White, flaky, or scaly areas between your toes or on the soles of your feet. Very itchy small blisters between your toes or on the soles of your feet. Small cuts in your skin. These cuts can become infected. Thick or discolored toenails. How is this diagnosed? This condition may be diagnosed with a physical exam and a review of your medical history. Your health care provider may also take a skin or toenail sample to examine under a microscope. How is this treated? This condition is treated with antifungal medicines. These may be applied as powders, ointments, or creams. In severe cases, an oral antifungal medicine may be given. Follow these instructions at home: Medicines Apply or take over-the-counter and prescription medicines only as told by your health care provider. Apply your antifungal medicine as told by your health care provider. Do not stop using the antifungal even if your condition improves. Foot care Do not scratch your feet. Keep your feet dry: Wear cotton or wool socks. Change your socks every day or if they become wet. Wear shoes that allow air to flow, such as sandals or canvas tennis shoes. Wash and dry your feet, including the area between your toes. Also, wash and dry your feet: Every day or as told by your health care provider. After exercising. General instructions Do not let others use towels, shoes, nail clippers, or other personal items that touch your feet. Protect your feet by wearing sandals in wet areas, such as locker rooms and shared showers. Keep all follow-up visits. This  is important. If you have diabetes, keep your blood sugar under control. Contact a health care provider if: You have a fever. You have swelling, soreness, warmth, or redness in your foot. Your feet are not getting better with treatment. Your symptoms get worse. You have new symptoms. You have severe pain. Summary Athlete's foot (tinea pedis) is a fungal infection of the skin  on your feet. It often occurs on skin that is between or underneath the toes. This condition is caused by a fungus that grows in warm, moist places. Symptoms include white, flaky, or scaly areas between your toes or on the soles of your feet. This condition is treated with antifungal medicines. Keep your feet clean. Always dry them thoroughly. This information is not intended to replace advice given to you by your health care provider. Make sure you discuss any questions you have with your health care provider. Document Revised: 11/17/2020 Document Reviewed: 11/17/2020 Elsevier Patient Education  2022 [employer].

## 2021-10-09 ENCOUNTER — Other Ambulatory Visit: Payer: Self-pay

## 2021-10-09 LAB — COMPREHENSIVE METABOLIC PANEL
ALT: 30 U/L (ref 0–53)
AST: 26 U/L (ref 0–37)
Albumin: 4.2 g/dL (ref 3.5–5.2)
Alkaline Phosphatase: 67 U/L (ref 39–117)
BUN: 17 mg/dL (ref 6–23)
CO2: 25 mEq/L (ref 19–32)
Calcium: 9.4 mg/dL (ref 8.4–10.5)
Chloride: 104 mEq/L (ref 96–112)
Creatinine, Ser: 0.96 mg/dL (ref 0.40–1.50)
GFR: 87.41 mL/min (ref >60.00)
Glucose, Bld: 211 mg/dL — ABNORMAL HIGH (ref 70–99)
Potassium: 4.3 mEq/L (ref 3.5–5.1)
Sodium: 138 mEq/L (ref 135–145)
Total Bilirubin: 0.6 mg/dL (ref 0.2–1.2)
Total Protein: 6.6 g/dL (ref 6.0–8.3)

## 2021-10-09 LAB — CBC WITH DIFFERENTIAL/PLATELET
Basophils Absolute: 0.1 10*3/uL (ref 0.0–0.1)
Basophils Relative: 0.9 % (ref 0.0–3.0)
Eosinophils Absolute: 0.1 10*3/uL (ref 0.0–0.7)
Eosinophils Relative: 1.7 % (ref 0.0–5.0)
HCT: 46.2 % (ref 39.0–52.0)
Hemoglobin: 14.9 g/dL (ref 13.0–17.0)
Lymphocytes Relative: 24 % (ref 12.0–46.0)
Lymphs Abs: 2 10*3/uL (ref 0.7–4.0)
MCHC: 32.3 g/dL (ref 30.0–36.0)
MCV: 79.8 fl (ref 78.0–100.0)
Monocytes Absolute: 0.5 10*3/uL (ref 0.1–1.0)
Monocytes Relative: 5.6 % (ref 3.0–12.0)
Neutro Abs: 5.6 10*3/uL (ref 1.4–7.7)
Neutrophils Relative %: 67.8 % (ref 43.0–77.0)
Platelets: 258 10*3/uL (ref 150.0–400.0)
RBC: 5.79 Mil/uL (ref 4.22–5.81)
RDW: 15.4 % (ref 11.5–15.5)
WBC: 8.2 10*3/uL (ref 4.0–10.5)

## 2021-10-09 LAB — PSA: PSA: 0.51 ng/mL (ref 0.10–4.00)

## 2021-10-09 LAB — HEMOGLOBIN A1C: Hgb A1c MFr Bld: 7.3 % — ABNORMAL HIGH (ref 4.6–6.5)

## 2021-10-09 MED ORDER — CLOTRIMAZOLE-BETAMETHASONE 1-0.05 % EX CREA: 1.0000 "application " | TOPICAL_CREAM | Freq: Every day | CUTANEOUS | 2 refills | Status: DC

## 2021-10-09 NOTE — Addendum Note (Signed)
Addended by: Quentin Ore on: 10/09/2021 11:19 AM ? ? Modules accepted: Orders ? ?

## 2021-10-10 ENCOUNTER — Other Ambulatory Visit: Payer: Self-pay

## 2021-10-10 ENCOUNTER — Ambulatory Visit
Admission: EM | Admit: 2021-10-10 | Discharge: 2021-10-10 | Disposition: A | Discharge status: Home / Self Care | Payer: Self-pay

## 2021-10-10 ENCOUNTER — Encounter: Payer: Self-pay | Admitting: Emergency Medicine

## 2021-10-10 DIAGNOSIS — G44209 Tension-type headache, unspecified, not intractable: Secondary | ICD-10-CM

## 2021-10-10 NOTE — ED Triage Notes (Signed)
Patient c/o headache that started this morning.  Patient denies N/V.  Patient states that she his job sent him here.   ?

## 2021-10-10 NOTE — ED Provider Notes (Signed)
[facility]    CSN: [id number] Arrival date & time: 10/10/21  1453      History   Chief Complaint Chief Complaint  Patient presents with   Headache    HPI Alexander Powers is a 56 y.o. male presenting for headache since earlier this morning.  Patient is a Museum/gallery exhibitions officer and states he developed a significant frontal headache while at work.  Reports he left work, went home and took a couple of Tylenol and went to sleep.  States he woke up feeling much better and reports that his headache is about 1 out of 10 currently.  Reports he has had headaches like this in the past but has been several years, about 6 to 7 years.  He reports possible blurry vision earlier but is seeing normally now.  No associated dizziness, weakness, nausea/vomiting, facial numbness or drooping, numbness or tingling.  No chest pain or breathing trouble.  No fever, neck pain/stiffness or URI symptoms.  Patient also does not report any head injuries.  Medical history significant for hypertension, hyperlipidemia, diabetes.  Presently his hypertension and diabetes are not controlled.  He did see a PCP 2 days ago regarding these conditions and had lab work.  Patient is following up with his PCP regarding these conditions.  No other concerns at this time.  Patient would like a note for work.  He states that his employer is the one who urged him to come to urgent care because they were concerned about him.  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   Right groin hernia 08/03/2019   Vitamin D deficiency 12/15/2018   DM2 (diabetes mellitus, type 2) (HCC) 04/21/2018   HTN (hypertension) 04/21/2018   HLD (hyperlipidemia) 04/21/2018   Glaucoma 04/21/2018   Umbilical hernia without obstruction or gangrene     Past Surgical History:  Procedure Laterality Date   HERNIA REPAIR     INSERTION OF MESH N/A 10/22/2017   Procedure: INSERTION OF MESH;  Surgeon: Ancil Linsey, MD;  Location: [facility];  Service: General;  Laterality: N/A;   UMBILICAL HERNIA REPAIR N/A 10/22/2017   Procedure: HERNIA REPAIR UMBILICAL ADULT WITH MESH;  Surgeon: Ancil Linsey, MD;  Location: [facility];  Service: General;  Laterality: N/A;       Home Medications    Prior to Admission medications   Medication Sig Start Date End Date Taking? Authorizing Provider  acetaminophen (TYLENOL) 500 MG tablet Take 1 tablet (500 mg total) by mouth every 4 (four) hours as needed. 03/22/19   McLean-Scocuzza, Pasty Spillers, MD  amLODipine (NORVASC) 2.5 MG tablet Take 1 tablet (2.5 mg total) by mouth once daily. 10/08/21   McLean-Scocuzza, Pasty Spillers, MD  cetirizine (ZYRTEC) 10 MG tablet Take 1 tablet (10 mg total) by mouth daily as needed for allergies. Patient not taking: Reported on 10/08/2021 05/28/21  McLean-Scocuzza, Pasty Spillers, MD  Cholecalciferol 1.25 MG (50000 UT) capsule Take 1 capsule (50,000 Units total) by mouth once a week. 10/08/21   McLean-Scocuzza, Pasty Spillers, MD  Clotrimazole 1 % OINT Apply 1 application topically 2 (two) times daily. Right groin 10/08/21   McLean-Scocuzza, Pasty Spillers, MD  clotrimazole-betamethasone (LOTRISONE) cream Apply 1 application topically once daily. 10/09/21   McLean-Scocuzza, Pasty Spillers, MD  fluconazole (DIFLUCAN) 150 MG tablet Take 1 tablet (150 mg total) by mouth once a week. 10/08/21   McLean-Scocuzza, Pasty Spillers, MD  hydrocortisone 2.5 % cream Apply topically 2 (two) times daily as needed to  right groin 10/08/21   McLean-Scocuzza, Pasty Spillers, MD  latanoprost (XALATAN) 0.005 % ophthalmic solution Place 1 drop into both eyes once daily at bedtime. 10/08/21   McLean-Scocuzza, Pasty Spillers, MD  losartan-hydrochlorothiazide (HYZAAR) 100-25 MG tablet Take 1 tablet by mouth once daily. 10/08/21   McLean-Scocuzza, Pasty Spillers, MD  meclizine (ANTIVERT) 25 MG tablet Take 1 tablet (25 mg total) by mouth 3 (three) times daily as needed for dizziness. Patient not taking: Reported on 10/08/2021 03/11/21   Defelice, Para March, NP  ondansetron (ZOFRAN-ODT) 4 MG disintegrating tablet Take 1 tablet (4 mg total) by mouth every 8 (eight) hours as needed for nausea or vomiting. Patient not taking: Reported on 10/08/2021 09/22/21   Merrilee Jansky, MD  rosuvastatin (CRESTOR) 10 MG tablet Take 1 tablet (10 mg total) by mouth once daily. 10/08/21   McLean-Scocuzza, Pasty Spillers, MD  Semaglutide,0.25 or 0.5MG /DOS, 2 MG/1.5ML SOPN Inject 0.5 mg into the skin once every 7 (seven) days. 10/08/21   McLean-Scocuzza, Pasty Spillers, MD  triamcinolone cream (KENALOG) 0.1 % Apply 1 application topically 2 (two) times daily as needed to right abdomen. 10/08/21   McLean-Scocuzza, Pasty Spillers, MD    Family History Family History  Problem Relation Age of Onset   Diabetes Father    Hyperlipidemia Father    Hypertension Father    Kidney disease Father    Diabetes Paternal Uncle     Social History Social History   Tobacco Use   Smoking status: Every Day    Packs/day: 0.50    Years: 27.00    Pack years: 13.50    Types: Cigarettes   Smokeless tobacco: Never   Tobacco comments:    age [age] 1/2 ppd quit age 39   Vaping Use   Vaping Use: Never used  Substance Use Topics   Alcohol use: Yes    Comment: occasionally   Drug use: Not Currently    Types: Marijuana     Allergies   Patient has no known allergies.   Review of Systems Review of Systems  Constitutional:  Negative for fatigue and fever.  HENT:  Negative for congestion and sore throat.    Eyes:  Negative for photophobia.  Respiratory:  Negative for cough and shortness of breath.   Cardiovascular:  Negative for chest pain.  Musculoskeletal:  Negative for gait problem, neck pain and neck stiffness.  Neurological:  Positive for headaches. Negative for dizziness, syncope, facial asymmetry, speech difficulty, weakness, light-headedness and numbness.    Physical Exam Triage Vital Signs ED Triage Vitals  Enc Vitals Group     BP 10/10/21 1513 (!) 161/88     Pulse Rate 10/10/21 1513 85     Resp 10/10/21 1513 15     Temp 10/10/21 1513 98.7 F (37.1 C)     Temp Source 10/10/21 1513 Oral     SpO2 10/10/21 1513 98 %  Weight 10/10/21 1512 189 lb (85.7 kg)     Height 10/10/21 1512 5\' 7"  (1.702 m)     Head Circumference --      Peak Flow --      Pain Score 10/10/21 1511 2     Pain Loc --      Pain Edu? --      Excl. in GC? --    No data found.  Updated Vital Signs BP (!) 153/75 (BP Location: Right Arm)    Pulse 85    Temp 98.7 F (37.1 C) (Oral)    Resp 15    Ht 5\' 7"  (1.702 m)    Wt 189 lb (85.7 kg)    SpO2 98%    BMI 29.60 kg/m      Physical Exam Vitals and nursing note reviewed.  Constitutional:      General: He is not in acute distress.    Appearance: Normal appearance. He is well-developed. He is not ill-appearing.  HENT:     Head: Normocephalic and atraumatic.     Nose: Nose normal.     Mouth/Throat:     Mouth: Mucous membranes are moist.     Pharynx: Oropharynx is clear.  Eyes:     General: No scleral icterus.    Extraocular Movements: Extraocular movements intact.     Conjunctiva/sclera: Conjunctivae normal.     Pupils: Pupils are equal, round, and reactive to light.  Cardiovascular:     Rate and Rhythm: Normal rate and regular rhythm.     Heart sounds: Normal heart sounds.  Pulmonary:     Effort: Pulmonary effort is normal. No respiratory distress.     Breath sounds: Normal breath sounds.  Musculoskeletal:     Cervical back: Neck supple.   Skin:    General: Skin is warm and dry.     Capillary Refill: Capillary refill takes less than 2 seconds.  Neurological:     General: No focal deficit present.     Mental Status: He is alert and oriented to person, place, and time. Mental status is at baseline.     Cranial Nerves: No cranial nerve deficit.     Motor: No weakness.     Coordination: Coordination normal.     Gait: Gait normal.  Psychiatric:        Mood and Affect: Mood normal.        Behavior: Behavior normal.        Thought Content: Thought content normal.     UC Treatments / Results  Labs (all labs ordered are listed, but only abnormal results are displayed) Labs Reviewed - No data to display  EKG   Radiology No results found.  Procedures Procedures (including critical care time)  Medications Ordered in UC Medications - No data to display  Initial Impression / Assessment and Plan / UC Course  I have reviewed the triage vital signs and the nursing notes.  Pertinent labs & imaging results that were available during my care of the patient were reviewed by me and considered in my medical decision making (see chart for details).  56 year old male presenting for headache that has been ongoing since earlier today and dramatically improved after Tylenol and rest.  No associated red flag signs or symptoms.  History of hypertension and diabetes.  BP presently 161/88.  Recheck is 153/75.  Patient working with his PCP to get BP under control.  Remainder vital signs normal and stable.  He is overall well-appearing.  Cranial nerve  exam is within normal limits.  Good strength of extremities.  Chest clear to auscultation heart regular rate and rhythm.  Suspect patient's symptoms likely due to tension type headache.  He does report getting stressed at work recently.  Encouraged him to continue the Tylenol, rest and fluids.  Reviewed ED precautions and provided him with a work note.   Final Clinical Impressions(s) / UC  Diagnoses   Final diagnoses:  Tension headache     Discharge Instructions      HEADACHE: You were seen in clinic today for headache. Rest and take meds as directed. If at any point, the headache becomes very severe, is associated with fever, is associated with neck pain/stiffness, you feel like passing out, the headache is different from any you've have had before, there are vision changes/issues with speech/issues with balance, or numbness/weakness in a part of the body, you should be seen urgently or emergently for more serious causes of headache      ED Prescriptions   None    PDMP not reviewed this encounter.   Shirlee Latch, PA-C 10/10/21 1609

## 2021-10-10 NOTE — Discharge Instructions (Addendum)
HEADACHE: You were seen in clinic today for headache. Rest and take meds as directed. If at any point, the headache becomes very severe, is associated with fever, is associated with neck pain/stiffness, you feel like passing out, the headache is different from any you've have had before, there are vision changes/issues with speech/issues with balance, or numbness/weakness in a part of the body, you should be seen urgently or emergently for more serious causes of headache  °

## 2021-10-13 NOTE — Progress Notes (Signed)
No answer, no voicemail.

## 2021-10-23 ENCOUNTER — Other Ambulatory Visit: Payer: Self-pay

## 2021-10-23 ENCOUNTER — Ambulatory Visit (INDEPENDENT_AMBULATORY_CARE_PROVIDER_SITE_OTHER): Payer: Self-pay

## 2021-10-23 ENCOUNTER — Ambulatory Visit
Admission: EM | Admit: 2021-10-23 | Discharge: 2021-10-23 | Disposition: A | Discharge status: Home / Self Care | Payer: Self-pay | Attending: Emergency Medicine | Admitting: Emergency Medicine

## 2021-10-23 DIAGNOSIS — M79641 Pain in right hand: Secondary | ICD-10-CM

## 2021-10-23 NOTE — ED Triage Notes (Signed)
Patient presents to Urgent Care with complaints of right hand pain since yesterday. He states it feels like "keep locking up." He reports repetitive hand use at work. Occupational health nurse instructed him to come in for eval. Not treating pain.  ? ?Denies numbness or tingling.  ?

## 2021-10-23 NOTE — ED Provider Notes (Signed)
?[facility] ? ? ? ?CSN: [id number] ?Arrival date & time: 10/23/21  1657 ? ? ?  ? ?History   ?Chief Complaint ?Chief Complaint  ?Patient presents with  ? Hand Pain  ? ? ?HPI ?Alexander Powers is a 56 y.o. male.  ? ?HPI ? ?56 year old male here for evaluation of right hand pain. ? ?Patient reports that for last 2 days he has been feeling as if his hand has been "locking up".  He denies any numbness or tingling in his fingers and when asked to clarify what he means by locking up he states that when he grips with his right hand sometimes his fingers stay flexed.  It mostly affects his fourth and fifth finger of his right hand and when he does close his fist he feels pain between the metacarpals of his fourth and fifth finger.  When asked about injury he states that today his symptoms started he did trip at work and fell onto the ground catching himself in a push-up position and is unsure if that caused any issue or not.  He has not had any swelling, bruising, or redness of his hand.  He does a lot of repetitive motion at work.  He was sent by his occupational health nurse for evaluation. ? ?Past Medical History:  ?Diagnosis Date  ? Diabetes mellitus without complication (HCC)   ? Difficult intubation   ? GERD (gastroesophageal reflux disease)   ? OCC- TUMS  ? Glaucoma   ? Hypercholesteremia   ? Hypertension   ? Strep throat   ? 01/24/18   ? ? ?Patient Active Problem List  ? Diagnosis Date Noted  ? Tinea pedis of right foot 10/08/2021  ? Jock itch 10/08/2021  ? Hypertension associated with diabetes (HCC) 10/08/2021  ? Vertigo 03/11/2021  ? Acute strain of neck muscle 05/22/2020  ? Neck pain 05/22/2020  ? Acute torticollis 05/22/2020  ? 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 Right groin hernia 08/03/2019  ? Vitamin D deficiency 12/15/2018  ? DM2 (diabetes mellitus, type 2) (HCC) 04/21/2018  ? HTN (hypertension) 04/21/2018  ? HLD (hyperlipidemia) 04/21/2018  ? Glaucoma 04/21/2018  ? Umbilical hernia without  obstruction or gangrene   ? ? ?Past Surgical History:  ?Procedure Laterality Date  ? HERNIA REPAIR    ? INSERTION OF MESH N/A 10/22/2017  ? Procedure: INSERTION OF MESH;  Surgeon: Ancil Linsey, MD;  Location: [facility];  Service: General;  Laterality: N/A;  ? UMBILICAL HERNIA REPAIR N/A 10/22/2017  ? Procedure: HERNIA REPAIR UMBILICAL ADULT WITH MESH;  Surgeon: Ancil Linsey, MD;  Location: [facility];  Service: General;  Laterality: N/A;  ? ? ? ? ? ?Home Medications   ? ?Prior to Admission medications   ?Medication Sig Start Date End Date Taking? Authorizing Provider  ?acetaminophen (TYLENOL) 500 MG tablet Take 1 tablet (500 mg total) by mouth every 4 (four) hours as needed. 03/22/19   McLean-Scocuzza, Pasty Spillers, MD  ?amLODipine (NORVASC) 2.5 MG tablet Take 1 tablet (2.5 mg total) by mouth once daily. 10/08/21   McLean-Scocuzza, Pasty Spillers, MD  ?cetirizine (ZYRTEC) 10 MG tablet Take 1 tablet (10 mg total) by mouth daily as needed for allergies. ?Patient not taking: Reported on 10/08/2021 05/28/21   McLean-Scocuzza, Pasty Spillers, MD  ?Cholecalciferol 1.25 MG (50000 UT) capsule Take 1 capsule (50,000 Units total) by mouth once a week. 10/08/21   McLean-Scocuzza, Pasty Spillers, MD  ?Clotrimazole 1 % OINT Apply 1 application topically 2 (two) times  daily. Right groin 10/08/21   McLean-Scocuzza, Pasty Spillers, MD  ?clotrimazole-betamethasone (LOTRISONE) cream Apply 1 application topically once daily. 10/09/21   McLean-Scocuzza, Pasty Spillers, MD  ?fluconazole (DIFLUCAN) 150 MG tablet Take 1 tablet (150 mg total) by mouth once a week. 10/08/21   McLean-Scocuzza, Pasty Spillers, MD  ?hydrocortisone 2.5 % cream Apply topically 2 (two) times daily as needed to right groin 10/08/21   McLean-Scocuzza, Pasty Spillers, MD  ?latanoprost (XALATAN) 0.005 % ophthalmic solution Place 1 drop into both eyes once daily at bedtime. 10/08/21   McLean-Scocuzza, Pasty Spillers, MD  ?losartan-hydrochlorothiazide (HYZAAR) 100-25 MG tablet Take 1 tablet by mouth once daily. 10/08/21   McLean-Scocuzza,  Pasty Spillers, MD  ?meclizine (ANTIVERT) 25 MG tablet Take 1 tablet (25 mg total) by mouth 3 (three) times daily as needed for dizziness. ?Patient not taking: Reported on 10/08/2021 03/11/21   Defelice, Para March, NP  ?ondansetron (ZOFRAN-ODT) 4 MG disintegrating tablet Take 1 tablet (4 mg total) by mouth every 8 (eight) hours as needed for nausea or vomiting. ?Patient not taking: Reported on 10/08/2021 09/22/21   Merrilee Jansky, MD  ?rosuvastatin (CRESTOR) 10 MG tablet Take 1 tablet (10 mg total) by mouth once daily. 10/08/21   McLean-Scocuzza, Pasty Spillers, MD  ?Semaglutide,0.25 or 0.5MG /DOS, 2 MG/1.5ML SOPN Inject 0.5 mg into the skin once every 7 (seven) days. 10/08/21   McLean-Scocuzza, Pasty Spillers, MD  ?triamcinolone cream (KENALOG) 0.1 % Apply 1 application topically 2 (two) times daily as needed to right abdomen. 10/08/21   McLean-Scocuzza, Pasty Spillers, MD  ? ? ?Family History ?Family History  ?Problem Relation Age of Onset  ? Diabetes Father   ? Hyperlipidemia Father   ? Hypertension Father   ? Kidney disease Father   ? Diabetes Paternal Uncle   ? ? ?Social History ?Social History  ? ?Tobacco Use  ? Smoking status: Every Day  ?  Packs/day: 0.50  ?  Years: 27.00  ?  Pack years: 13.50  ?  Types: Cigarettes  ? Smokeless tobacco: Never  ? Tobacco comments:  ?  age [age] 1/2 ppd quit age 56   ?Vaping Use  ? Vaping Use: Never used  ?Substance Use Topics  ? Alcohol use: Yes  ?  Comment: occasionally  ? Drug use: Not Currently  ?  Types: Marijuana  ? ? ? ?Allergies   ?Patient has no known allergies. ? ? ?Review of Systems ?Review of Systems  ?Musculoskeletal:  Positive for myalgias. Negative for arthralgias and joint swelling.  ?Skin:  Negative for color change.  ?Neurological:  Negative for weakness and numbness.  ?Hematological: Negative.   ?Psychiatric/Behavioral: Negative.    ? ? ?Physical Exam ?Triage Vital Signs ?ED Triage Vitals [10/23/21 1713]  ?Enc Vitals Group  ?   BP (!) 144/70  ?   Pulse Rate 90  ?   Resp 16  ?   Temp 98.5 ?F (36.9  ?C)  ?   Temp Source Oral  ?   SpO2 100 %  ?   Weight   ?   Height   ?   Head Circumference   ?   Peak Flow   ?   Pain Score 0  ?   Pain Loc   ?   Pain Edu?   ?   Excl. in GC?   ? ?No data found. ? ?Updated Vital Signs ?BP (!) 144/70 (BP Location: Left Arm)   Pulse 90   Temp 98.5 ?F (36.9 ?C) (Oral)   Resp  16   SpO2 100%  ? ?Visual Acuity ?Right Eye Distance:   ?Left Eye Distance:   ?Bilateral Distance:   ? ?Right Eye Near:   ?Left Eye Near:    ?Bilateral Near:    ? ?Physical Exam ?Vitals and nursing note reviewed.  ?Constitutional:   ?   Appearance: Normal appearance. He is not ill-appearing.  ?HENT:  ?   Head: Normocephalic and atraumatic.  ?Musculoskeletal:     ?   General: Tenderness and signs of injury present. No swelling or deformity. Normal range of motion.  ?Skin: ?   General: Skin is warm and dry.  ?   Capillary Refill: Capillary refill takes less than 2 seconds.  ?   Findings: No bruising or erythema.  ?Neurological:  ?   General: No focal deficit present.  ?   Mental Status: He is alert and oriented to person, place, and time.  ?   Sensory: No sensory deficit.  ?   Motor: No weakness.  ?Psychiatric:     ?   Mood and Affect: Mood normal.     ?   Behavior: Behavior normal.     ?   Thought Content: Thought content normal.     ?   Judgment: Judgment normal.  ? ? ? ?UC Treatments / Results  ?Labs ?(all labs ordered are listed, but only abnormal results are displayed) ?Labs Reviewed - No data to display ? ?EKG ? ? ?Radiology ?DG Hand Complete Right ? ?Result Date: 10/23/2021 ?CLINICAL DATA:  Pain between the fourth and fifth metacarpals beginning yesterday. Repetitive hand use at work. EXAM: RIGHT HAND - COMPLETE 3+ VIEW COMPARISON:  None. FINDINGS: There is no evidence of fracture or dislocation. There is no evidence of arthropathy or other focal bone abnormality. Soft tissues are unremarkable. IMPRESSION: Negative. Electronically Signed   By: Paulina FusiMark  Shogry M.D.   On: 10/23/2021 17:53    ? ?Procedures ?Procedures (including critical care time) ? ?Medications Ordered in UC ?Medications - No data to display ? ?Initial Impression / Assessment and Plan / UC Course  ?I have reviewed the triage vital signs and the nursing

## 2021-10-23 NOTE — Discharge Instructions (Addendum)
Your x-rays today did not demonstrate any broken or dislocated bones in your right hand. ? ?The placer you are having pain is in the soft tissue between the bones of your fourth and fifth finger in your hand.  You may well have bruised them when you broke your fall 2 days ago at work. ? ?Take over-the-counter Tylenol and ibuprofen according to the package instructions as needed for pain and inflammation. ? ?Follow the hand exercises given in your discharge instructions to help regain strength and mobility to your hand. ? ?If your symptoms continue, or they worsen, please return for reevaluation or follow-up with orthopedics such as [facility]. ?

## 2021-10-31 ENCOUNTER — Encounter: Payer: Self-pay | Admitting: *Deleted

## 2021-11-06 IMAGING — DX DG CHEST 2V
2 series · 2 of 2 positions shown · non-contrast
Comparison: 10/19/2017

CLINICAL DATA: Fatigue, shortness of breath, diaphoresis, history
diabetes mellitus and hypertension

EXAM:
CHEST - 2 VIEW

[w chest pa]
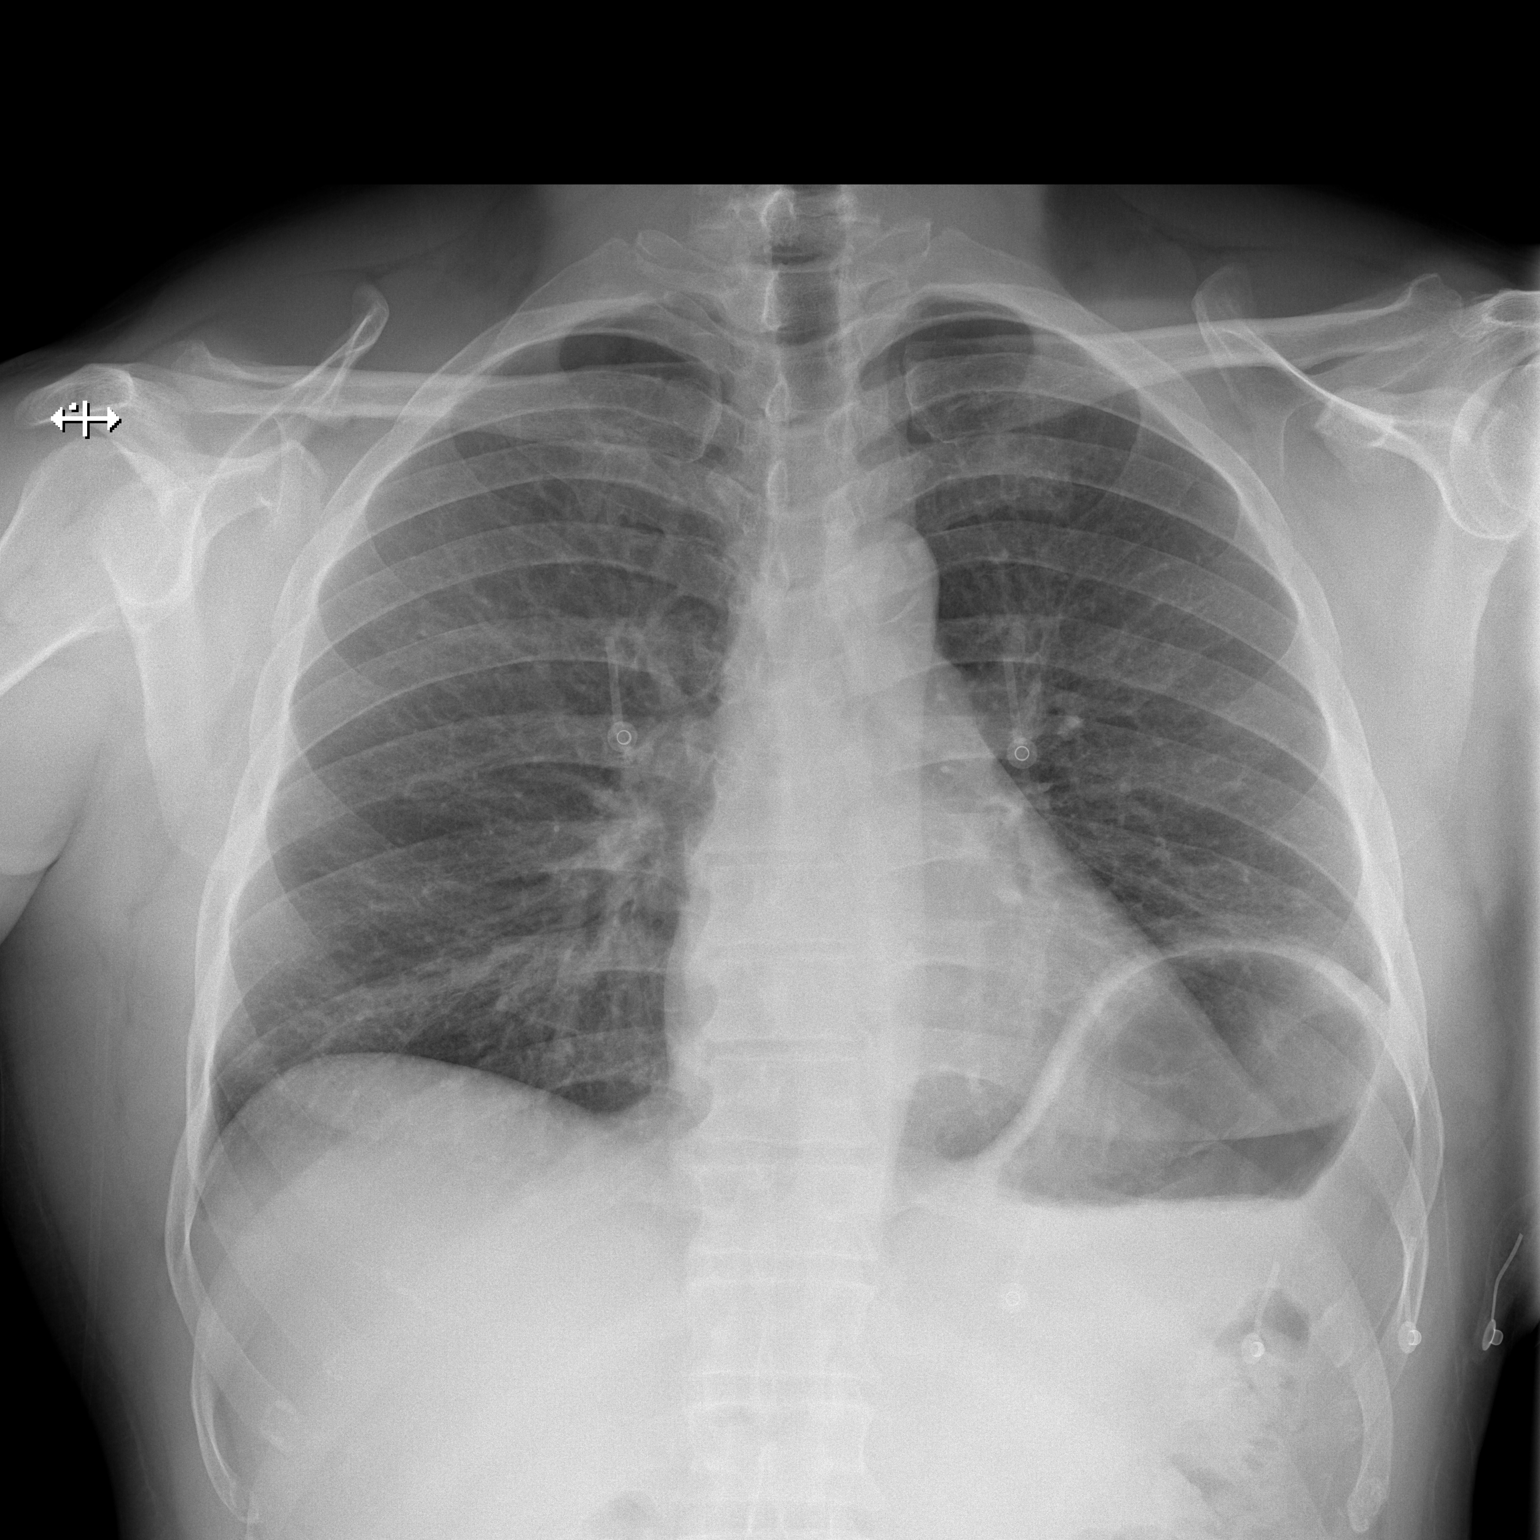

[w chest lat]
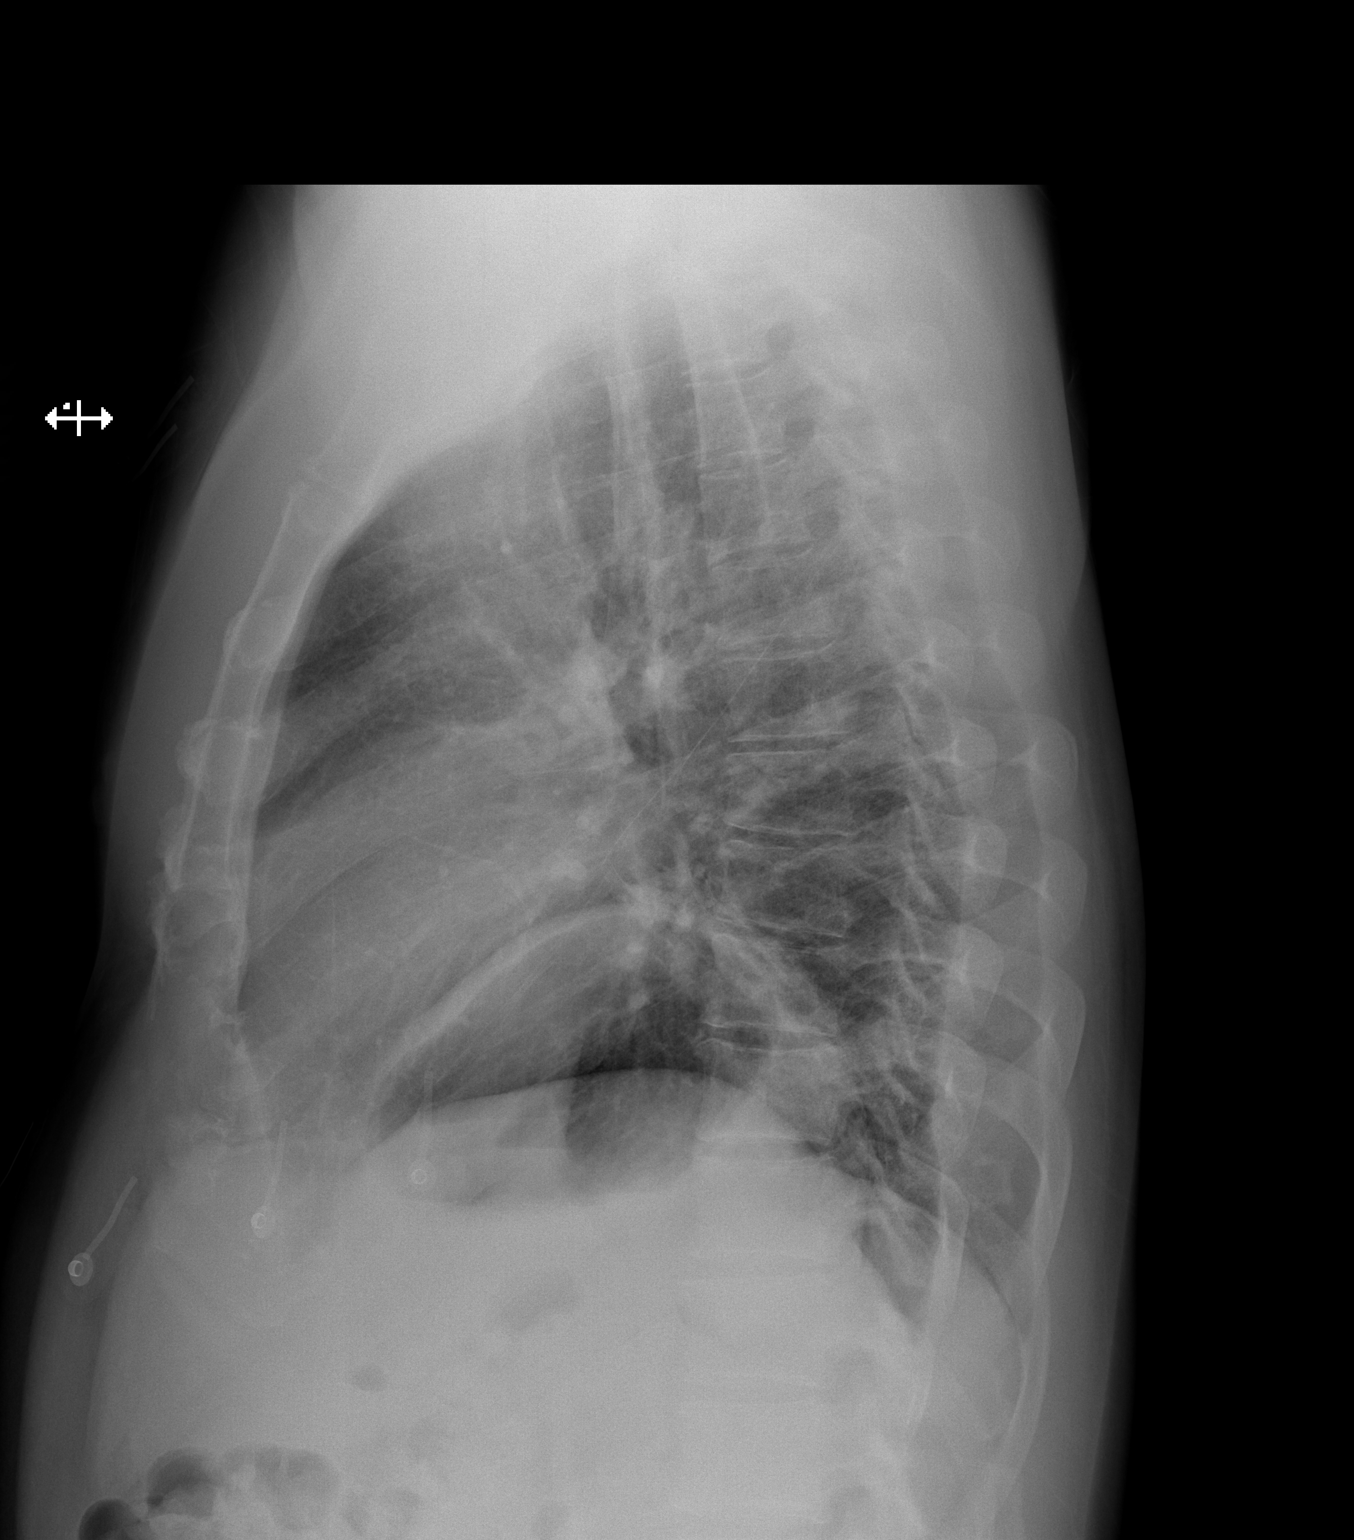

[2 of 2 positions shown; findings below may reference images not displayed]

FINDINGS: Chronic eventration LEFT diaphragm.

Normal heart size, mediastinal contours, and pulmonary vascularity.

Atherosclerotic calcification aorta.

Minimal residual LEFT basilar atelectasis, improved.

Lungs otherwise clear.

No pulmonary infiltrate, pleural effusion or pneumothorax.

Osseous structures unremarkable.
IMPRESSION: Improved LEFT basilar atelectasis.

No new abnormalities.

## 2021-11-10 ENCOUNTER — Other Ambulatory Visit: Payer: Self-pay

## 2021-11-11 ENCOUNTER — Other Ambulatory Visit: Payer: Self-pay

## 2021-11-30 ENCOUNTER — Ambulatory Visit (INDEPENDENT_AMBULATORY_CARE_PROVIDER_SITE_OTHER): Payer: Self-pay

## 2021-11-30 ENCOUNTER — Ambulatory Visit
Admission: EM | Admit: 2021-11-30 | Discharge: 2021-11-30 | Disposition: A | Discharge status: Home / Self Care | Payer: Self-pay | Attending: Internal Medicine | Admitting: Internal Medicine

## 2021-11-30 DIAGNOSIS — M7711 Lateral epicondylitis, right elbow: Secondary | ICD-10-CM

## 2021-11-30 DIAGNOSIS — M25521 Pain in right elbow: Secondary | ICD-10-CM

## 2021-11-30 MED ORDER — MELOXICAM 7.5 MG PO TABS: 7.5000 mg | ORAL_TABLET | Freq: Two times a day (BID) | ORAL | 0 refills | Status: AC

## 2021-11-30 MED ORDER — TENNIS ELBOW STRAP MISC: 0 refills | Status: AC

## 2021-11-30 NOTE — ED Triage Notes (Signed)
Patient is here for "Right Elbow Pain". No injury known. Started hurting yesterday. No bite, sting, or injury (known). Pain with swelling. No fever. Pain does go down to forearm.  ?

## 2021-11-30 NOTE — ED Provider Notes (Signed)
[facility]    CSN: [id number] Arrival date & time: 11/30/21  1528      History   Chief Complaint Chief Complaint  Patient presents with   Elbow Pain    HPI Alexander Powers is a 56 y.o. male who presents with lateral elbow pain since yesterday. Does a lot of lifting boxes repetitively at work. Has had this before in the past. Denies paresthesia.     Past Medical History:  Diagnosis Date   Diabetes mellitus without complication (HCC)    Difficult intubation    GERD (gastroesophageal reflux disease)    OCC- TUMS   Glaucoma    Hypercholesteremia    Hypertension    Strep throat    01/24/18     Patient Active Problem List   Diagnosis Date Noted   Tinea pedis of right foot 10/08/2021   Jock itch 10/08/2021   Hypertension associated with diabetes (HCC) 10/08/2021   Vertigo 03/11/2021   Acute strain of neck muscle 05/22/2020   Neck pain 05/22/2020   Acute torticollis 05/22/2020   Right groin hernia 08/03/2019   Primary osteoarthritis of left elbow 12/30/2018   Vitamin D deficiency 12/15/2018   DM2 (diabetes mellitus, type 2) (HCC) 04/21/2018   HTN (hypertension) 04/21/2018   HLD (hyperlipidemia) 04/21/2018   Glaucoma 04/21/2018   Umbilical hernia without obstruction or gangrene     Past Surgical History:  Procedure Laterality Date   HERNIA REPAIR     INSERTION OF MESH N/A 10/22/2017   Procedure: INSERTION OF MESH;  Surgeon: Ancil Linsey, MD;  Location: [facility] ORS;  Service: General;  Laterality: N/A;   UMBILICAL HERNIA REPAIR N/A 10/22/2017   Procedure: HERNIA REPAIR UMBILICAL ADULT WITH MESH;  Surgeon: Ancil Linsey, MD;  Location: [facility];  Service: General;  Laterality: N/A;       Home Medications    Prior to Admission medications   Medication Sig Start Date End Date Taking? Authorizing Provider  amLODipine (NORVASC) 2.5 MG tablet Take 1 tablet (2.5 mg total) by mouth once daily. 10/08/21  Yes McLean-Scocuzza, Pasty Spillers, MD   losartan-hydrochlorothiazide (HYZAAR) 100-25 MG tablet Take 1 tablet by mouth once daily. 10/08/21  Yes McLean-Scocuzza, Pasty Spillers, MD  rosuvastatin (CRESTOR) 10 MG tablet Take 1 tablet (10 mg total) by mouth once daily. 10/08/21  Yes McLean-Scocuzza, Pasty Spillers, MD  acetaminophen (TYLENOL) 500 MG tablet Take 1 tablet (500 mg total) by mouth every 4 (four) hours as needed. 03/22/19   McLean-Scocuzza, Pasty Spillers, MD  cetirizine (ZYRTEC) 10 MG tablet Take 1 tablet (10 mg total) by mouth daily as needed for allergies. Patient not taking: Reported on 10/08/2021 05/28/21   McLean-Scocuzza, Pasty Spillers, MD  Cholecalciferol 1.25 MG (50000 UT) capsule Take 1 capsule (50,000 Units total) by mouth once a week. 10/08/21   McLean-Scocuzza, Pasty Spillers, MD  Clotrimazole 1 % OINT Apply 1 application topically 2 (two) times daily. Right groin 10/08/21   McLean-Scocuzza, Pasty Spillers, MD  clotrimazole-betamethasone (LOTRISONE) cream Apply 1 application topically once daily. 10/09/21   McLean-Scocuzza, Pasty Spillers, MD  Elastic Bandages & Supports (TENNIS ELBOW STRAP) MISC Ear if on R proximal forearm during arm use for 7-10 days 11/30/21  Yes Rodriguez-Southworth, Nettie Elm, PA-C  fluconazole (DIFLUCAN) 150 MG tablet Take 1 tablet (150 mg total) by mouth once a week. 10/08/21   McLean-Scocuzza, Pasty Spillers, MD  hydrocortisone 2.5 % cream Apply topically 2 (two) times daily as needed to right groin 10/08/21   McLean-Scocuzza, Pasty Spillers,  MD  latanoprost (XALATAN) 0.005 % ophthalmic solution Place 1 drop into both eyes once daily at bedtime. 10/08/21   McLean-Scocuzza, Pasty Spillers, MD  meclizine (ANTIVERT) 25 MG tablet Take 1 tablet (25 mg total) by mouth 3 (three) times daily as needed for dizziness. Patient not taking: Reported on 10/08/2021 03/11/21   Defelice, Para March, NP  meloxicam (MOBIC) 7.5 MG tablet Take 1 tablet (7.5 mg total) by mouth 2 (two) times daily. 11/30/21  Yes Rodriguez-Southworth, Nettie Elm, PA-C  ondansetron (ZOFRAN-ODT) 4 MG disintegrating tablet Take 1 tablet  (4 mg total) by mouth every 8 (eight) hours as needed for nausea or vomiting. Patient not taking: Reported on 10/08/2021 09/22/21   Merrilee Jansky, MD  Semaglutide,0.25 or 0.5MG /DOS, 2 MG/1.5ML SOPN Inject 0.5 mg into the skin once every 7 (seven) days. 10/08/21   McLean-Scocuzza, Pasty Spillers, MD  triamcinolone cream (KENALOG) 0.1 % Apply 1 application topically 2 (two) times daily as needed to right abdomen. 10/08/21   McLean-Scocuzza, Pasty Spillers, MD    Family History Family History  Problem Relation Age of Onset   Diabetes Father    Hyperlipidemia Father    Hypertension Father    Kidney disease Father    Diabetes Paternal Uncle     Social History Social History   Tobacco Use   Smoking status: Every Day    Packs/day: 0.50    Years: 27.00    Pack years: 13.50    Types: Cigarettes   Smokeless tobacco: Never   Tobacco comments:    age 50-37 1/2 ppd quit age 24   Vaping Use   Vaping Use: Never used  Substance Use Topics   Alcohol use: Yes    Comment: occasionally   Drug use: Not Currently    Types: Marijuana     Allergies   Patient has no known allergies.   Review of Systems Review of Systems  Musculoskeletal:  Positive for arthralgias and joint swelling. Negative for myalgias.  Neurological:  Negative for weakness and numbness.    Physical Exam Triage Vital Signs ED Triage Vitals  Enc Vitals Group     BP 11/30/21 1538 (!) 141/71     Pulse Rate 11/30/21 1538 68     Resp 11/30/21 1538 18     Temp 11/30/21 1538 98.2 F (36.8 C)     Temp Source 11/30/21 1538 Oral     SpO2 11/30/21 1538 98 %     Weight 11/30/21 1537 190 lb (86.2 kg)     Height 11/30/21 1537 5\' 8"  (1.727 m)     Head Circumference --      Peak Flow --      Pain Score 11/30/21 1534 8     Pain Loc --      Pain Edu? --      Excl. in GC? --    No data found.  Updated Vital Signs BP (!) 141/71 (BP Location: Left Arm)   Pulse 68   Temp 98.2 F (36.8 C) (Oral)   Resp 18   Ht 5\' 8"  (1.727 m)   Wt 190  lb (86.2 kg)   SpO2 98%   BMI 28.89 kg/m   Visual Acuity Right Eye Distance:   Left Eye Distance:   Bilateral Distance:    Right Eye Near:   Left Eye Near:    Bilateral Near:     Physical Exam Vitals and nursing note reviewed.  Constitutional:      General: He is not in acute distress.  Appearance: He is not toxic-appearing.  HENT:     Right Ear: External ear normal.     Left Ear: External ear normal.  Eyes:     General: No scleral icterus.    Conjunctiva/sclera: Conjunctivae normal.  Pulmonary:     Effort: Pulmonary effort is normal.  Musculoskeletal:        General: Normal range of motion.     Cervical back: Neck supple.     Comments: R ELBOW- with mild swelling over the lateral epicondyle where he is very tender. This area is not red or hot.   Skin:    General: Skin is warm and dry.     Findings: No bruising or rash.  Neurological:     Mental Status: He is alert and oriented to person, place, and time.     Motor: No weakness.     Gait: Gait normal.  Psychiatric:        Mood and Affect: Mood normal.        Behavior: Behavior normal.        Thought Content: Thought content normal.        Judgment: Judgment normal.     UC Treatments / Results  Labs (all labs ordered are listed, but only abnormal results are displayed) Labs Reviewed - No data to display  EKG   Radiology DG Elbow Complete Right  Result Date: 11/30/2021 CLINICAL DATA:  Pain. Swelling EXAM: RIGHT ELBOW - COMPLETE 3+ VIEW COMPARISON:  None. FINDINGS: There is no evidence of fracture, dislocation, or joint effusion. Degenerative changes of the elbow joint. No aggressive appearing focal bone abnormality. Soft tissues are unremarkable. IMPRESSION: No acute displaced fracture or dislocation. Electronically Signed   By: Tish Frederickson M.D.   On: 11/30/2021 16:17    Procedures Procedures (including critical care time)  Medications Ordered in UC Medications - No data to display  Initial  Impression / Assessment and Plan / UC Course  I have reviewed the triage vital signs and the nursing notes. R lateral epicondylitis I placed him on Mobic and gave him Rx for tennis elbow brace.  Final Clinical Impressions(s) / UC Diagnoses   Final diagnoses:  None   Discharge Instructions   None    ED Prescriptions     Medication Sig Dispense Auth. Provider   meloxicam (MOBIC) 7.5 MG tablet Take 1 tablet (7.5 mg total) by mouth 2 (two) times daily. 14 tablet Rodriguez-Southworth, Danielle Mink, PA-C   Elastic Bandages & Supports (TENNIS ELBOW STRAP) MISC Ear if on R proximal forearm during arm use for 7-10 days 1 each Rodriguez-Southworth, Nettie Elm, PA-C      PDMP not reviewed this encounter.   Garey Ham, PA-C 11/30/21 1622

## 2021-12-22 ENCOUNTER — Ambulatory Visit
Admission: EM | Admit: 2021-12-22 | Discharge: 2021-12-22 | Disposition: A | Discharge status: Home / Self Care | Payer: Self-pay | Attending: Emergency Medicine | Admitting: Emergency Medicine

## 2021-12-22 DIAGNOSIS — H538 Other visual disturbances: Secondary | ICD-10-CM | POA: Insufficient documentation

## 2021-12-22 DIAGNOSIS — S76212A Strain of adductor muscle, fascia and tendon of left thigh, initial encounter: Secondary | ICD-10-CM | POA: Insufficient documentation

## 2021-12-22 DIAGNOSIS — R739 Hyperglycemia, unspecified: Secondary | ICD-10-CM | POA: Insufficient documentation

## 2021-12-22 LAB — GLUCOSE, CAPILLARY
Glucose-Capillary: >600 mg/dL (ref 70–99)
Glucose-Capillary: >600 mg/dL (ref 70–99)

## 2021-12-22 NOTE — ED Triage Notes (Signed)
Patient is here for "eye congestion, redness, watery, drainage" for about 4 days. No fever. "Groin pain, cooking at stove, touched penis up against stove (in covered clothing) and felt a pain, then when walking up steps felt it again recently".  Urine "fine". No penis discharge, redness and/or concern regarding STI. 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 "Thirsty a lot". No dysuria.  ?

## 2021-12-22 NOTE — Discharge Instructions (Addendum)
Your vision may be off secondary to either your glaucoma being untreated and the pressures in your eyes raising or due to your elevated blood sugar.  In any event, your blood sugars over 600 and you need IV fluid and insulin to bring your blood sugar down.  Therefore I am recommending that Alexander Powers go to the emergency department. ?

## 2021-12-22 NOTE — ED Provider Notes (Signed)
?[facility] ? ? ? ?CSN: [id number] ?Arrival date & time: 12/22/21  1846 ? ? ?  ? ?History   ?Chief Complaint ?Chief Complaint  ?Patient presents with  ? Eye Problem  ? Groin Pain  ? ? ?HPI ?Alexander Powers is a 56 y.o. male.  ? ?HPI ? ?56 year old male here for evaluation of multiple complaints. ?Patient reports that for the last 4 days he has been experiencing wavy, flashing lights in his vision.  He does have a history of migraines but has not had one in a long time.  He states he has been experiencing headaches as well for the past 4 days for which she is taking BC powders.  The BCs have helped but has not resolved everything.  Additionally, patient is also complaining of pain in his left groin.  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  He does not feel it all the time but today he did feel it when he was walking up the stairs carrying a basket of clothes.  He is not having any pain at present.  Additionally, patient has been experiencing increased thirst and increased urination.  He does have a history of diabetes but he states he is currently out of all of his medications and does not have any money to get medications.  This includes his high blood pressure medication and his glaucoma medication. ? ? ?Past Medical History:  ?Diagnosis Date  ? Diabetes mellitus without complication (HCC)   ? Difficult intubation   ? GERD (gastroesophageal reflux disease)   ? OCC- TUMS  ? Glaucoma   ? Hypercholesteremia   ? Hypertension   ? Strep throat   ? 01/24/18   ? ? ?Patient Active Problem List  ? Diagnosis Date Noted  ? Tinea pedis of right foot 10/08/2021  ? Jock itch 10/08/2021  ? Hypertension associated with diabetes (HCC) 10/08/2021  ? Vertigo 03/11/2021  ? Acute strain of neck muscle 05/22/2020  ? Neck pain 05/22/2020  ? Acute torticollis 05/22/2020  ? Right groin hernia 08/03/2019  ? Primary osteoarthritis of left elbow  12/30/2018  ? Vitamin D deficiency 12/15/2018  ? DM2 (diabetes mellitus, type 2) (HCC) 04/21/2018  ? HTN (hypertension) 04/21/2018  ? HLD (hyperlipidemia) 04/21/2018  ? Glaucoma 04/21/2018  ? Umbilical hernia without obstruction or gangrene   ? ? ?Past Surgical History:  ?Procedure Laterality Date  ? HERNIA REPAIR    ? INSERTION OF MESH N/A 10/22/2017  ? Procedure: INSERTION OF MESH;  Surgeon: Ancil Linsey, MD;  Location: [facility] ORS;  Service: General;  Laterality: N/A;  ? UMBILICAL HERNIA REPAIR N/A 10/22/2017  ? Procedure: HERNIA REPAIR UMBILICAL ADULT WITH MESH;  Surgeon: Ancil Linsey, MD;  Location: [facility] ORS;  Service: General;  Laterality: N/A;  ? ? ? ? ? ?Home Medications   ? ?Prior to Admission medications   ?Medication Sig Start Date End Date Taking? Authorizing Provider  ?amLODipine (NORVASC) 2.5 MG tablet Take 1 tablet (2.5 mg total) by mouth once daily. 10/08/21  Yes McLean-Scocuzza, Pasty Spillers, MD  ?Cholecalciferol 1.25 MG (50000 UT) capsule Take 1 capsule (50,000 Units total) by mouth once a week. 10/08/21  Yes McLean-Scocuzza, Pasty Spillers, MD  ?losartan-hydrochlorothiazide (HYZAAR) 100-25 MG tablet Take 1 tablet by mouth once daily. 10/08/21  Yes McLean-Scocuzza, Pasty Spillers, MD  ?meclizine (ANTIVERT) 25 MG tablet Take 1 tablet (25 mg total)  by mouth 3 (three) times daily as needed for dizziness. 03/11/21  Yes Defelice, Para March, NP  ?rosuvastatin (CRESTOR) 10 MG tablet Take 1 tablet (10 mg total) by mouth once daily. 10/08/21  Yes McLean-Scocuzza, Pasty Spillers, MD  ?Semaglutide,0.25 or 0.5MG /DOS, 2 MG/1.5ML SOPN Inject 0.5 mg into the skin once every 7 (seven) days. 10/08/21  Yes McLean-Scocuzza, Pasty Spillers, MD  ?acetaminophen (TYLENOL) 500 MG tablet Take 1 tablet (500 mg total) by mouth every 4 (four) hours as needed. 03/22/19   McLean-Scocuzza, Pasty Spillers, MD  ?cetirizine (ZYRTEC) 10 MG tablet Take 1 tablet (10 mg total) by mouth daily as needed for allergies. ?Patient not taking: Reported on 10/08/2021 05/28/21    McLean-Scocuzza, Pasty Spillers, MD  ?Clotrimazole 1 % OINT Apply 1 application topically 2 (two) times daily. Right groin 10/08/21   McLean-Scocuzza, Pasty Spillers, MD  ?clotrimazole-betamethasone (LOTRISONE) cream Apply 1 application topically once daily. 10/09/21   McLean-Scocuzza, Pasty Spillers, MD  ?Elastic Bandages & Supports (TENNIS ELBOW STRAP) MISC Ear if on R proximal forearm during arm use for 7-10 days 11/30/21   Rodriguez-Southworth, Nettie Elm, PA-C  ?fluconazole (DIFLUCAN) 150 MG tablet Take 1 tablet (150 mg total) by mouth once a week. 10/08/21   McLean-Scocuzza, Pasty Spillers, MD  ?hydrocortisone 2.5 % cream Apply topically 2 (two) times daily as needed to right groin 10/08/21   McLean-Scocuzza, Pasty Spillers, MD  ?latanoprost (XALATAN) 0.005 % ophthalmic solution Place 1 drop into both eyes once daily at bedtime. 10/08/21   McLean-Scocuzza, Pasty Spillers, MD  ?meloxicam (MOBIC) 7.5 MG tablet Take 1 tablet (7.5 mg total) by mouth 2 (two) times daily. 11/30/21   Rodriguez-Southworth, Nettie Elm, PA-C  ?ondansetron (ZOFRAN-ODT) 4 MG disintegrating tablet Take 1 tablet (4 mg total) by mouth every 8 (eight) hours as needed for nausea or vomiting. ?Patient not taking: Reported on 10/08/2021 09/22/21   Merrilee Jansky, MD  ?triamcinolone cream (KENALOG) 0.1 % Apply 1 application topically 2 (two) times daily as needed to right abdomen. 10/08/21   McLean-Scocuzza, Pasty Spillers, MD  ? ? ?Family History ?Family History  ?Problem Relation Age of Onset  ? Diabetes Father   ? Hyperlipidemia Father   ? Hypertension Father   ? Kidney disease Father   ? Diabetes Paternal Uncle   ? ? ?Social History ?Social History  ? ?Tobacco Use  ? Smoking status: Every Day  ?  Packs/day: 0.50  ?  Years: 27.00  ?  Pack years: 13.50  ?  Types: Cigarettes  ? Smokeless tobacco: Never  ? Tobacco comments:  ?  age 56-37 1/2 ppd quit age 33   ?Vaping Use  ? Vaping Use: Never used  ?Substance Use Topics  ? Alcohol use: Yes  ?  Comment: occasionally  ? Drug use: Not Currently  ?  Types: Marijuana   ? ? ? ?Allergies   ?Patient has no known allergies. ? ? ?Review of Systems ?Review of Systems  ?Constitutional:  Negative for fever.  ?Eyes:  Positive for visual disturbance. Negative for discharge and redness.  ?Endocrine: Positive for polydipsia and polyuria.  ?Musculoskeletal:  Positive for myalgias.  ?Neurological:  Positive for headaches. Negative for dizziness, tremors, syncope, facial asymmetry and weakness.  ?Hematological: Negative.   ?Psychiatric/Behavioral: Negative.    ? ? ?Physical Exam ?Triage Vital Signs ?ED Triage Vitals  ?Enc Vitals Group  ?   BP 12/22/21 1928 136/72  ?   Pulse Rate 12/22/21 1928 86  ?   Resp 12/22/21 1928 18  ?  Temp 12/22/21 1928 97.7 ?F (36.5 ?C)  ?   Temp Source 12/22/21 1928 Oral  ?   SpO2 12/22/21 1928 100 %  ?   Weight 12/22/21 1926 190 lb (86.2 kg)  ?   Height 12/22/21 1926 5\' 8"  (1.727 m)  ?   Head Circumference --   ?   Peak Flow --   ?   Pain Score 12/22/21 1921 0  ?   Pain Loc --   ?   Pain Edu? --   ?   Excl. in GC? --   ? ?No data found. ? ?Updated Vital Signs ?BP 136/72 (BP Location: Left Arm)   Pulse 86   Temp 97.7 ?F (36.5 ?C) (Oral)   Resp 18   Ht 5\' 8"  (1.727 m)   Wt 190 lb (86.2 kg)   SpO2 100%   BMI 28.89 kg/m?  ? ?Visual Acuity ?Right Eye Distance: 20/30 Uncorrected ?Left Eye Distance: 20/30 Uncorrected ?Bilateral Distance: 20/30 Uncorrected ? ?Right Eye Near:   ?Left Eye Near:    ?Bilateral Near:    ? ?Physical Exam ?Vitals and nursing note reviewed.  ?Constitutional:   ?   Appearance: Normal appearance. He is not ill-appearing.  ?HENT:  ?   Head: Normocephalic and atraumatic.  ?Eyes:  ?   General: No scleral icterus.    ?   Right eye: No discharge.     ?   Left eye: No discharge.  ?   Extraocular Movements: Extraocular movements intact.  ?   Conjunctiva/sclera: Conjunctivae normal.  ?   Pupils: Pupils are equal, round, and reactive to light.  ?Cardiovascular:  ?   Rate and Rhythm: Normal rate and regular rhythm.  ?   Pulses: Normal pulses.  ?   Heart  sounds: Normal heart sounds. No murmur heard. ?  No friction rub. No gallop.  ?Pulmonary:  ?   Effort: Pulmonary effort is normal.  ?   Breath sounds: Normal breath sounds. No wheezing, rhonchi or rales.  ?Musculoskeletal:     ?

## 2021-12-22 NOTE — ED Notes (Signed)
Patient is being discharged from the Urgent Care and sent to the Emergency Department via personal vehicle . Per Thurmond Butts, NP, patient is in need of higher level of care due to hyperglycemia. Patient is aware and verbalizes understanding of plan of care.  ?Vitals:  ? 12/22/21 1928  ?BP: 136/72  ?Pulse: 86  ?Resp: 18  ?Temp: 97.7 ?F (36.5 ?C)  ?SpO2: 100%  ?  ?

## 2021-12-23 ENCOUNTER — Telehealth: Payer: Self-pay | Admitting: Pharmacy Technician

## 2021-12-23 ENCOUNTER — Other Ambulatory Visit: Payer: Self-pay

## 2021-12-23 ENCOUNTER — Emergency Department
Admission: EM | Admit: 2021-12-23 | Discharge: 2021-12-23 | Disposition: A | Discharge status: Home / Self Care | Payer: Self-pay | Attending: Emergency Medicine | Admitting: Emergency Medicine

## 2021-12-23 DIAGNOSIS — R739 Hyperglycemia, unspecified: Secondary | ICD-10-CM

## 2021-12-23 DIAGNOSIS — E1165 Type 2 diabetes mellitus with hyperglycemia: Secondary | ICD-10-CM | POA: Insufficient documentation

## 2021-12-23 DIAGNOSIS — Z794 Long term (current) use of insulin: Secondary | ICD-10-CM | POA: Insufficient documentation

## 2021-12-23 DIAGNOSIS — I10 Essential (primary) hypertension: Secondary | ICD-10-CM | POA: Insufficient documentation

## 2021-12-23 DIAGNOSIS — E785 Hyperlipidemia, unspecified: Secondary | ICD-10-CM

## 2021-12-23 LAB — URINALYSIS, ROUTINE W REFLEX MICROSCOPIC
Bacteria, UA: NONE SEEN
Bilirubin Urine: NEGATIVE
Glucose, UA: >=500 mg/dL — AB
Hgb urine dipstick: NEGATIVE
Ketones, ur: NEGATIVE mg/dL
Leukocytes,Ua: NEGATIVE
Nitrite: NEGATIVE
Protein, ur: NEGATIVE mg/dL
Specific Gravity, Urine: 1.029 (ref 1.005–1.030)
Squamous Epithelial / HPF: NONE SEEN (ref 0–5)
pH: 6 (ref 5.0–8.0)

## 2021-12-23 LAB — COMPREHENSIVE METABOLIC PANEL
ALT: 38 U/L (ref 0–44)
AST: 27 U/L (ref 15–41)
Albumin: 3.6 g/dL (ref 3.5–5.0)
Alkaline Phosphatase: 76 U/L (ref 38–126)
Anion gap: 9 (ref 5–15)
BUN: 24 mg/dL — ABNORMAL HIGH (ref 6–20)
CO2: 26 mmol/L (ref 22–32)
Calcium: 9.5 mg/dL (ref 8.9–10.3)
Chloride: 97 mmol/L — ABNORMAL LOW (ref 98–111)
Creatinine, Ser: 1.1 mg/dL (ref 0.61–1.24)
GFR, Estimated: >60 mL/min (ref >60)
Glucose, Bld: 575 mg/dL (ref 70–99)
Potassium: 4.4 mmol/L (ref 3.5–5.1)
Sodium: 132 mmol/L — ABNORMAL LOW (ref 135–145)
Total Bilirubin: 1 mg/dL (ref 0.3–1.2)
Total Protein: 6.6 g/dL (ref 6.5–8.1)

## 2021-12-23 LAB — CBG MONITORING, ED
Glucose-Capillary: 576 mg/dL (ref 70–99)
Glucose-Capillary: 526 mg/dL (ref 70–99)
Glucose-Capillary: 317 mg/dL — ABNORMAL HIGH (ref 70–99)
Glucose-Capillary: 268 mg/dL — ABNORMAL HIGH (ref 70–99)

## 2021-12-23 LAB — CBC
HCT: 44.7 % (ref 39.0–52.0)
Hemoglobin: 14.7 g/dL (ref 13.0–17.0)
MCH: 25.2 pg — ABNORMAL LOW (ref 26.0–34.0)
MCHC: 32.9 g/dL (ref 30.0–36.0)
MCV: 76.5 fL — ABNORMAL LOW (ref 80.0–100.0)
Platelets: 254 10*3/uL (ref 150–400)
RBC: 5.84 MIL/uL — ABNORMAL HIGH (ref 4.22–5.81)
RDW: 12.9 % (ref 11.5–15.5)
WBC: 9.4 10*3/uL (ref 4.0–10.5)
nRBC: 0 % (ref 0.0–0.2)

## 2021-12-23 MED ORDER — ONDANSETRON HCL 4 MG/2ML IJ SOLN
4.0000 mg | Freq: Once | INTRAMUSCULAR | Status: AC
  Administered 2021-12-23: 4 mg via INTRAVENOUS
  Filled 2021-12-23: qty 2

## 2021-12-23 MED ORDER — BLOOD GLUCOSE MONITOR KIT
PACK | 0 refills | Status: AC
Start: 2021-12-23 — End: ?
  Filled 2021-12-23: qty 1, 30d supply, fill #0

## 2021-12-23 MED ORDER — INSULIN ASPART 100 UNIT/ML IJ SOLN
10.0000 [IU] | Freq: Once | INTRAMUSCULAR | Status: AC
  Administered 2021-12-23: 10 [IU] via INTRAVENOUS
  Filled 2021-12-23: qty 1

## 2021-12-23 MED ORDER — LOSARTAN POTASSIUM 100 MG PO TABS
100.0000 mg | ORAL_TABLET | Freq: Every day | ORAL | 3 refills | Status: AC
  Filled 2021-12-23 – 2022-02-04 (×2): qty 30, 30d supply, fill #0
  Filled 2022-04-30: qty 30, 30d supply, fill #1

## 2021-12-23 MED ORDER — SODIUM CHLORIDE 0.9 % IV BOLUS
1000.0000 mL | Freq: Once | INTRAVENOUS | Status: AC
  Administered 2021-12-23: 1000 mL via INTRAVENOUS

## 2021-12-23 MED ORDER — AMLODIPINE BESYLATE 2.5 MG PO TABS
2.5000 mg | ORAL_TABLET | Freq: Every day | ORAL | 3 refills | Status: AC
  Filled 2021-12-23 – 2022-02-04 (×2): qty 30, 30d supply, fill #0
  Filled 2022-04-30: qty 30, 30d supply, fill #1

## 2021-12-23 MED ORDER — RIGHTEST GS550 BLOOD GLUCOSE VI STRP
ORAL_STRIP | 0 refills | Status: AC
  Filled 2021-12-23: qty 100, 25d supply, fill #0

## 2021-12-23 MED ORDER — ROSUVASTATIN CALCIUM 10 MG PO TABS
10.0000 mg | ORAL_TABLET | Freq: Every day | ORAL | 3 refills | Status: AC
  Filled 2021-12-23: qty 30, 30d supply, fill #0
  Filled 2022-02-04: qty 5, 5d supply, fill #0
  Filled 2022-02-04: qty 25, 25d supply, fill #0
  Filled 2022-04-30: qty 30, 30d supply, fill #1

## 2021-12-23 MED ORDER — SODIUM CHLORIDE 0.9 % IV BOLUS
1000.0000 mL | Freq: Once | INTRAVENOUS | Status: AC
Start: 2021-12-23 — End: 2021-12-23
  Administered 2021-12-23: 1000 mL via INTRAVENOUS

## 2021-12-23 MED ORDER — KETOROLAC TROMETHAMINE 30 MG/ML IJ SOLN
30.0000 mg | Freq: Once | INTRAMUSCULAR | Status: AC
Start: 2021-12-23 — End: 2021-12-23
  Administered 2021-12-23: 30 mg via INTRAVENOUS
  Filled 2021-12-23: qty 1

## 2021-12-23 MED ORDER — RIGHTEST GL300 LANCETS MISC
0 refills | Status: AC
  Filled 2021-12-23: qty 100, 25d supply, fill #0

## 2021-12-23 MED ORDER — HYDROCHLOROTHIAZIDE 25 MG PO TABS
25.0000 mg | ORAL_TABLET | Freq: Every day | ORAL | 3 refills | Status: AC
Start: 2021-12-23 — End: ?
  Filled 2021-12-23 – 2022-02-04 (×2): qty 30, 30d supply, fill #0
  Filled 2022-04-30: qty 30, 30d supply, fill #1

## 2021-12-23 MED ORDER — SEMAGLUTIDE(0.25 OR 0.5MG/DOS) 2 MG/3ML ~~LOC~~ SOPN
0.5000 mg | PEN_INJECTOR | SUBCUTANEOUS | 3 refills | Status: DC
  Filled 2021-12-23: qty 3, 28d supply, fill #0

## 2021-12-23 NOTE — Progress Notes (Signed)
Inpatient Diabetes Program Recommendations ? ?AACE/ADA: New Consensus Statement on Inpatient Glycemic Control  ? ?Target Ranges:  Prepandial:   less than 140 mg/dL ?     Peak postprandial:   less than 180 mg/dL (1-2 hours) ?     Critically ill patients:  140 - 180 mg/dL  ? ? Latest Reference Range & Units 12/22/21 20:05 12/22/21 20:06 12/23/21 10:03 12/23/21 12:03 12/23/21 13:15  ?Glucose-Capillary 70 - 99 mg/dL >600 (HH) >600 (HH) 576 (HH) 526 (HH) 317 (H)  ? ? ? Latest Reference Range & Units 12/23/21 10:58  ?Glucose 70 - 99 mg/dL 575 (HH)  ? ? ? Latest Reference Range & Units 10/08/21 14:50  ?Hemoglobin A1C 4.6 - 6.5 % 7.3 (H)  ? ?Review of Glycemic Control ? ?Diabetes history: DM2 ?Outpatient Diabetes medications: Ozempic 0.5 mg Qweek ?Current orders for Inpatient glycemic control: None; in ED ? ?Inpatient Diabetes Program Recommendations:   ? ?Outpatient: Please provide Rx of Ozempic 0.5 mg Qweek which [facility] has in stock. Also please provide Rx for glucose monitoring kit (#[id number]). ? ?NOTE: Spoke with patient about diabetes and home regimen for diabetes control. Patient reports that he does not have insurance and he ran out of his DM medication about 1 month ago. Patient reports that he was taking Ozempic 0.5 mg Qweek. Inquired about any knowledge of [facility] or [facility] and patient states that he does not know anything about [facility] but he was getting medications [facility]). Patient reports that he actually went by [facility] today prior to coming to the hospital and was given some paperwork to fill out. Patient states that when he was taking Ozempic, his DM was well controlled. Patient has not been checking glucose lately because he does not have a working glucometer (states his grandchild broke the machine). Patient reports when he was checking glucose his glucose was in the 100's mg/dl. Patient reports he was having symptoms  of hyperglycemia for several days and since his vision started to become an issue he decided to go get checked. Discussed importance of checking CBGs and maintaining good CBG control to prevent long-term and short-term complications. Explained how hyperglycemia leads to damage within blood vessels which lead to the common complications seen with uncontrolled diabetes. Stressed to the patient the importance of improving glycemic control to prevent further complications from uncontrolled diabetes. Patient has application for [facility] and [facility] at bedside. Encouraged patient to get paperwork completed so he could turn it in when he went to get discharge medications filled. Encouraged patient to get established with [facility] or other clinic for uninsured so he can follow up consistently and continue to get needed prescriptions.   Patient verbalized understanding of information discussed and reports no further questions at this time related to diabetes. Called Jeanna with [facility] to ask about glucose monitoring kit and informed that provider can send over script for it to [facility] at discharge.Called [facility] and told they have Ozempic available. ? ?Thanks, ?Barnie Alderman, RN, MSN, CDE ?Diabetes Coordinator ?Inpatient Diabetes Program ?[phone] (Team Pager) ? ? ? ?

## 2021-12-23 NOTE — ED Provider Notes (Signed)
? ?[facility] ?Provider Note ? ? ? Event Date/Time  ? First MD Initiated Contact with Patient 12/23/21 1044   ?  (approximate) ? ?History  ? ?Chief Complaint: Hyperglycemia ? ?HPI ? ?[name] is a 56 y.o. male with a past medical history of diabetes, hypertension, hyperlipidemia presents to the emergency department for high blood sugar.  According to the patient for the past several days he has been having high blood sugar.  He states he has been out of all of his medications for the past 2 months.  He went to medication management today and they referred him to the ER for evaluation first before refilling his medications.  Here the patient denies any medical complaints besides high blood sugar and thirst. ? ?Physical Exam  ? ?Triage Vital Signs: ?ED Triage Vitals  ?Enc Vitals Group  ?   BP 12/23/21 1004 115/69  ?   Pulse Rate 12/23/21 1004 77  ?   Resp 12/23/21 1004 18  ?   Temp 12/23/21 1004 98 ?F (36.7 ?C)  ?   Temp Source 12/23/21 1004 Oral  ?   SpO2 12/23/21 1004 97 %  ?   Weight 12/23/21 1005 180 lb (81.6 kg)  ?   Height 12/23/21 1005 5\' 8"  (1.727 m)  ?   Head Circumference --   ?   Peak Flow --   ?   Pain Score 12/23/21 1004 7  ?   Pain Loc --   ?   Pain Edu? --   ?   Excl. in GC? --   ? ? ?Most recent vital signs: ?Vitals:  ? 12/23/21 1330 12/23/21 1400  ?BP: 129/74 (!) 114/58  ?Pulse: 66 69  ?Resp:  18  ?Temp:    ?SpO2: 100% 100%  ? ? ?General: Awake, no distress.  ?CV:  Good peripheral perfusion.  Regular rate and rhythm  ?Resp:  Normal effort.  Equal breath sounds bilaterally.  ?Abd:  No distention.  Soft, nontender.  No rebound or guarding. ? ? ? ?ED Results / Procedures / Treatments  ? ?MEDICATIONS ORDERED IN ED: ?Medications  ?sodium chloride 0.9 % bolus 1,000 mL (0 mLs Intravenous Stopped 12/23/21 1222)  ?ketorolac (TORADOL) 30 MG/ML injection 30 mg (30 mg Intravenous Given 12/23/21 1101)  ?ondansetron (ZOFRAN) injection 4 mg (4 mg Intravenous Given 12/23/21 1101)  ?insulin  aspart (novoLOG) injection 10 Units (10 Units Intravenous Given 12/23/21 1226)  ?sodium chloride 0.9 % bolus 1,000 mL (1,000 mLs Intravenous New Bag/Given 12/23/21 1420)  ? ? ? ?IMPRESSION / MDM / ASSESSMENT AND PLAN / ED COURSE  ?I reviewed the triage vital signs and the nursing notes. ? ?Patient presents emergency department for high blood sugar.  Patient states he has been out of his medications for the past 2 months.  Overall the patient appears well he has no medical complaints besides feeling thirsty.  Patient receiving water.  We will check labs and continue to close monitor. ? ?Patient's labs have shown a significant hyperglycemia glucose 575 however reassuringly normal anion gap.  Patient's urinalysis shows greater than 500 glucose but no ketones.  We will dose IV insulin, chemistry shows a potassium of 4.4.  We will IV hydrate and recheck.  CBC is normal. ? ?After insulin and a liter of fluids patient's blood glucose is down to 317 we will dose 1 additional liter of fluid and recheck.  The diabetic coordinator has been down to speak to the patient.  I have sent  new prescriptions to medication management and the diabetic coordinator has confirmed with medication management that they carry Ozempic.  Patient will be discharged shortly. ? ?FINAL CLINICAL IMPRESSION(S) / ED DIAGNOSES  ? ?Hyperglycemia ? ?Note:  This document was prepared using Dragon voice recognition software and may include unintentional dictation errors. ?  ?Minna Antis, MD ?12/23/21 1439 ? ?

## 2021-12-23 NOTE — ED Notes (Signed)
See triage note. Pt reports excessive thirstiness and urination; denies dizziness or nausea; reports HA around forehead and eyes; reports an aura; denies history of migraines. Pt laying calmly on stretcher; skin dry; resp reg/unlabored.  ?

## 2021-12-23 NOTE — Telephone Encounter (Signed)
Patient only signed DOH Attestation.  Would need to provide current year's household income if PAP medications were needed. ? ?Alexander Powers J. [name] ?Patient Advocate Specialist ?[facility]  ?

## 2021-12-23 NOTE — ED Notes (Signed)
Lights dimmed for pt; call bell within reach of pt; pt educated about call bell; rail up; stretcher locked low.  ?

## 2021-12-23 NOTE — ED Triage Notes (Signed)
Pt here from [facility] with hyperglycemia. Pt was told that his blood sugars were reading greater than 600. Pt states he has been out of his injections for about a month. Pt also c/o a migraine. ?

## 2021-12-23 NOTE — TOC Initial Note (Signed)
Transition of Care (TOC) - Initial/Assessment Note  ? ? ?Patient Details  ?Name: Alexander Powers ?MRN: [id number] ?Date of Birth: 1966-05-02 ? ?Transition of Care (TOC) CM/SW Contact:    ?Allayne Butcher, RN ?Phone Number: ?12/23/2021, 2:16 PM ? ?Clinical Narrative:                 ?Patient seen in the emergency room for hyperglycemia, MD reports that if patient's next blood sugar check is below 300 he can discharge home.  He has no insurance and has been given an application for Medication Management.  Emergency room physician has sent his prescriptions along with script for new glucose monitor over to Medication Management.   ?  ?  ? ? ?Patient Goals and CMS Choice ?  ?  ?  ? ?Expected Discharge Plan and Services ?  ?  ?  ?  ?  ?                ?  ?  ?  ?  ?  ?  ?  ?  ?  ?  ? ?Prior Living Arrangements/Services ?  ?  ?  ?       ?  ?  ?  ?  ? ?Activities of Daily Living ?  ?  ? ?Permission Sought/Granted ?  ?  ?   ?   ?   ?   ? ?Emotional Assessment ?  ?  ?  ?  ?  ?  ? ?Admission diagnosis:  Hyperglycemia  ?Patient Active Problem List  ? Diagnosis Date Noted  ? Tinea pedis of right foot 10/08/2021  ? Jock itch 10/08/2021  ? Hypertension associated with diabetes (HCC) 10/08/2021  ? Vertigo 03/11/2021  ? Acute strain of neck muscle 05/22/2020  ? Neck pain 05/22/2020  ? Acute torticollis 05/22/2020  ? Right groin hernia 08/03/2019  ? 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 Vitamin D deficiency 12/15/2018  ? DM2 (diabetes mellitus, type 2) (HCC) 04/21/2018  ? HTN (hypertension) 04/21/2018  ? HLD (hyperlipidemia) 04/21/2018  ? Glaucoma 04/21/2018  ? Umbilical hernia without obstruction or gangrene   ? ?PCP:  McLean-Scocuzza, Pasty Spillers, MD ?Pharmacy:   ?[id number] - [place] ([place]), [place] - [address] ?[address] ?[place] [address] ?Phone: [phone] Fax: [phone] ? ?[facility] ?[address] ?[place]  [place] [address] ?Phone: [phone] Fax: [phone] ? ? ? ? ?Social Determinants of Health (SDOH) Interventions ?  ? ?Readmission Risk Interventions ?   ? View : No data to display.  ?  ?  ?  ? ? ? ?

## 2021-12-23 NOTE — ED Notes (Signed)
Verbal okay from [place] to give pt cup of water; given. Pt reports will provide urine sample soon. Urinal attached to bedrail.  ?

## 2021-12-25 ENCOUNTER — Ambulatory Visit (INDEPENDENT_AMBULATORY_CARE_PROVIDER_SITE_OTHER): Payer: Self-pay | Admitting: Internal Medicine

## 2021-12-25 ENCOUNTER — Encounter: Payer: Self-pay | Admitting: Internal Medicine

## 2021-12-25 ENCOUNTER — Other Ambulatory Visit: Payer: Self-pay

## 2021-12-25 VITALS — BP 110/60 | HR 71 | Temp 97.7°F | Resp 14 | Ht 68.0 in | Wt 174.6 lb

## 2021-12-25 DIAGNOSIS — I10 Essential (primary) hypertension: Secondary | ICD-10-CM

## 2021-12-25 DIAGNOSIS — E1159 Type 2 diabetes mellitus with other circulatory complications: Secondary | ICD-10-CM

## 2021-12-25 DIAGNOSIS — K029 Dental caries, unspecified: Secondary | ICD-10-CM

## 2021-12-25 DIAGNOSIS — E1165 Type 2 diabetes mellitus with hyperglycemia: Secondary | ICD-10-CM

## 2021-12-25 DIAGNOSIS — I152 Hypertension secondary to endocrine disorders: Secondary | ICD-10-CM

## 2021-12-25 MED ORDER — BLOOD GLUCOSE METER KIT
PACK | 0 refills | Status: AC
  Filled 2021-12-25: qty 1, fill #0

## 2021-12-25 MED ORDER — SEMAGLUTIDE(0.25 OR 0.5MG/DOS) 2 MG/3ML ~~LOC~~ SOPN
0.5000 mg | PEN_INJECTOR | SUBCUTANEOUS | 5 refills | Status: DC
  Filled 2021-12-25 – 2022-02-04 (×2): qty 3, 28d supply, fill #0

## 2021-12-25 MED ORDER — AMOXICILLIN-POT CLAVULANATE 875-125 MG PO TABS: 1.0000 | ORAL_TABLET | Freq: Two times a day (BID) | ORAL | 0 refills | Status: AC

## 2021-12-25 MED ORDER — BLOOD GLUCOSE METER KIT
PACK | 0 refills | Status: DC
  Filled 2021-12-25: qty 1, fill #0

## 2021-12-25 NOTE — Progress Notes (Signed)
Chief Complaint  Patient presents with   Hospitalization Follow-up    [facility] ED 5/16 for hyperglycemia. Elevated BS:575 today unsure what time it was. At [facility] it was over 600 was given IV insulin and BS decreased to 275. Compliant with meds seeing [facility] med management.   F/u  12/23/21 [place] visit for cbgs >500s and he does have decaying teeth no insurance and no dental care recently on ozempic 0.25 given iv insulin and ivf and glucose came down to 275. He has been out of ozempic x 1 month at med management clinic meter not working at home given one touch verio reflect meter today but strips expired   Review of Systems  Constitutional:  Negative for weight loss.  HENT:  Negative for hearing loss.   Eyes:  Negative for blurred vision.  Respiratory:  Negative for shortness of breath.   Cardiovascular:  Negative for chest pain.  Gastrointestinal:  Negative for abdominal pain and blood in stool.  Genitourinary:  Negative for dysuria.  Musculoskeletal:  Negative for falls and joint pain.  Skin:  Negative for rash.  Neurological:  Negative for headaches.  Psychiatric/Behavioral:  Negative for depression.   Past Medical History:  Diagnosis Date   Diabetes mellitus without complication ([place])    Difficult intubation    GERD (gastroesophageal reflux disease)    OCC- TUMS   Glaucoma    Hypercholesteremia    Hypertension    Strep throat    01/24/18    Past Surgical History:  Procedure Laterality Date   HERNIA REPAIR     INSERTION OF MESH N/A 10/22/2017   Procedure: INSERTION OF MESH;  Surgeon: Vickie Epley, MD;  Location: [facility] ORS;  Service: General;  Laterality: N/A;   UMBILICAL HERNIA REPAIR N/A 10/22/2017   Procedure: HERNIA REPAIR UMBILICAL ADULT WITH MESH;  Surgeon: Vickie Epley, MD;  Location: [facility] ORS;  Service: General;  Laterality: N/A;   Family History  Problem Relation Age of Onset   Diabetes Father    Hyperlipidemia Father    Hypertension Father    Kidney disease Father     Diabetes Paternal Uncle    Social History   Socioeconomic History   Marital status: Single    Spouse name: Not on file   Number of children: Not on file   Years of education: Not on file   Highest education level: Not on file  Occupational History   Not on file  Tobacco Use   Smoking status: Every Day    Packs/day: 0.50    Years: 27.00    Pack years: 13.50    Types: Cigarettes   Smokeless tobacco: Never   Tobacco comments:    age [age] 1/2 ppd quit age 31   Vaping Use   Vaping Use: Never used  Substance and Sexual Activity   Alcohol use: Yes    Comment: occasionally   Drug use: Not Currently    Types: Marijuana   Sexual activity: Not on file  Other Topics Concern   Not on file  Social History Narrative   GED, Glass blower/designer    No guns    Wears seat belt    Safe in relationship engaged to [employer]    Works [employer]   -out of work since 04/2019 on disability       Lives with girlfriend Marlow   Social Determinants of Health   Financial Resource Strain: Not on file  Food Insecurity: Not on [employer]  Needs: Not on file  Physical Activity: Not on file  Stress: Not on file  Social Connections: Not on file  Intimate Partner Violence: Not on file   Current Meds  Medication Sig   acetaminophen (TYLENOL) 500 MG tablet Take 1 tablet (500 mg total) by mouth every 4 (four) hours as needed.   amLODipine (NORVASC) 2.5 MG tablet Take 1 tablet (2.5 mg total) by mouth once daily.   amoxicillin-clavulanate (AUGMENTIN) 875-125 MG tablet Take 1 tablet by mouth 2 (two) times daily.   blood glucose meter kit and supplies KIT Use up to four times daily as directed.   Cholecalciferol 1.25 MG (50000 UT) capsule Take 1 capsule (50,000 Units total) by mouth once a week.   Clotrimazole 1 % OINT Apply 1 application topically 2 (two) times daily. Right groin   clotrimazole-betamethasone (LOTRISONE) cream Apply 1 application topically once daily.    Elastic Bandages & Supports (TENNIS ELBOW STRAP) MISC Ear if on R proximal forearm during arm use for 7-10 days   fluconazole (DIFLUCAN) 150 MG tablet Take 1 tablet (150 mg total) by mouth once a week.   glucose blood (RIGHTEST GS550 BLOOD GLUCOSE) test strip Use up to 4 times daily as directed.   hydrochlorothiazide (HYDRODIURIL) 25 MG tablet Take 1 tablet (25 mg total) by mouth once daily. (Take with losartan 134m).   hydrocortisone 2.5 % cream Apply topically 2 (two) times daily as needed to right groin   latanoprost (XALATAN) 0.005 % ophthalmic solution Place 1 drop into both eyes once daily at bedtime.   losartan (COZAAR) 100 MG tablet Take 1 tablet (100 mg total) by mouth once daily. (Take with hydrochlorothizide 267m.   meclizine (ANTIVERT) 25 MG tablet Take 1 tablet (25 mg total) by mouth 3 (three) times daily as needed for dizziness.   meloxicam (MOBIC) 7.5 MG tablet Take 1 tablet (7.5 mg total) by mouth 2 (two) times daily.   Rightest GL300 Lancets MISC Use up to 4 times daily as directed.   rosuvastatin (CRESTOR) 10 MG tablet Take 1 tablet (10 mg total) by mouth once daily.   triamcinolone cream (KENALOG) 0.1 % Apply 1 application topically 2 (two) times daily as needed to right abdomen.   [DISCONTINUED] blood glucose meter kit and supplies 1 year strips and lancets Dispense based on patient and insurance preference. Use up to four times daily as directed. (FOR ICD-10 E10.9, E11.9).   [DISCONTINUED] Semaglutide,0.25 or 0.5MG/DOS, 2 MG/3ML SOPN Inject 0.5 mg into the skin once every 7 (seven) days.   No Known Allergies Recent Results (from the past 2160 hour(s))  Comprehensive metabolic panel     Status: Abnormal   Collection Time: 10/08/21  2:50 PM  Result Value Ref Range   Sodium 138 135 - 145 mEq/L   Potassium 4.3 3.5 - 5.1 mEq/L   Chloride 104 96 - 112 mEq/L   CO2 25 19 - 32 mEq/L   Glucose, Bld 211 (H) 70 - 99 mg/dL   BUN 17 6 - 23 mg/dL   Creatinine, Ser 0.96 0.40 - 1.50  mg/dL   Total Bilirubin 0.6 0.2 - 1.2 mg/dL   Alkaline Phosphatase 67 39 - 117 U/L   AST 26 0 - 37 U/L   ALT 30 0 - 53 U/L   Total Protein 6.6 6.0 - 8.3 g/dL   Albumin 4.2 3.5 - 5.2 g/dL   GFR 87.41 >60.00 mL/min    Comment: Calculated using the CKD-EPI Creatinine Equation (2021)   Calcium  9.4 8.4 - 10.5 mg/dL  Hemoglobin A1c     Status: Abnormal   Collection Time: 10/08/21  2:50 PM  Result Value Ref Range   Hgb A1c MFr Bld 7.3 (H) 4.6 - 6.5 %    Comment: Glycemic Control Guidelines for People with Diabetes:Non Diabetic:  <6%Goal of Therapy: <7%Additional Action Suggested:  >8%   CBC with Differential/Platelet     Status: None   Collection Time: 10/08/21  2:50 PM  Result Value Ref Range   WBC 8.2 4.0 - 10.5 K/uL   RBC 5.79 4.22 - 5.81 Mil/uL   Hemoglobin 14.9 13.0 - 17.0 g/dL   HCT 46.2 39.0 - 52.0 %   MCV 79.8 78.0 - 100.0 fl   MCHC 32.3 30.0 - 36.0 g/dL   RDW 15.4 11.5 - 15.5 %   Platelets 258.0 150.0 - 400.0 K/uL   Neutrophils Relative % 67.8 43.0 - 77.0 %   Lymphocytes Relative 24.0 12.0 - 46.0 %   Monocytes Relative 5.6 3.0 - 12.0 %   Eosinophils Relative 1.7 0.0 - 5.0 %   Basophils Relative 0.9 0.0 - 3.0 %   Neutro Abs 5.6 1.4 - 7.7 K/uL   Lymphs Abs 2.0 0.7 - 4.0 K/uL   Monocytes Absolute 0.5 0.1 - 1.0 K/uL   Eosinophils Absolute 0.1 0.0 - 0.7 K/uL   Basophils Absolute 0.1 0.0 - 0.1 K/uL  PSA     Status: None   Collection Time: 10/08/21  2:50 PM  Result Value Ref Range   PSA 0.51 0.10 - 4.00 ng/mL    Comment: Test performed using Access Hybritech PSA Assay, a parmagnetic partical, chemiluminecent immunoassay.  Glucose, capillary     Status: Abnormal   Collection Time: 12/22/21  8:05 PM  Result Value Ref Range   Glucose-Capillary >600 (HH) 70 - 99 mg/dL    Comment: Glucose reference range applies only to samples taken after fasting for at least 8 hours.   Comment 1 Repeat Test   Glucose, capillary     Status: Abnormal   Collection Time: 12/22/21  8:06 PM  Result  Value Ref Range   Glucose-Capillary >600 (HH) 70 - 99 mg/dL    Comment: Glucose reference range applies only to samples taken after fasting for at least 8 hours.  CBG monitoring, ED     Status: Abnormal   Collection Time: 12/23/21 10:03 AM  Result Value Ref Range   Glucose-Capillary 576 (HH) 70 - 99 mg/dL    Comment: Glucose reference range applies only to samples taken after fasting for at least 8 hours.  Urinalysis, Routine w reflex microscopic     Status: Abnormal   Collection Time: 12/23/21 10:57 AM  Result Value Ref Range   Color, Urine STRAW (A) YELLOW   APPearance CLEAR (A) CLEAR   Specific Gravity, Urine 1.029 1.005 - 1.030   pH 6.0 5.0 - 8.0   Glucose, UA >=500 (A) NEGATIVE mg/dL   Hgb urine dipstick NEGATIVE NEGATIVE   Bilirubin Urine NEGATIVE NEGATIVE   Ketones, ur NEGATIVE NEGATIVE mg/dL   Protein, ur NEGATIVE NEGATIVE mg/dL   Nitrite NEGATIVE NEGATIVE   Leukocytes,Ua NEGATIVE NEGATIVE   RBC / HPF 0-5 0 - 5 RBC/hpf   WBC, UA 0-5 0 - 5 WBC/hpf   Bacteria, UA NONE SEEN NONE SEEN   Squamous Epithelial / LPF NONE SEEN 0 - 5    Comment: Performed at [facility], [address], [place], [place] [address]  CBC  Status: Abnormal   Collection Time: 12/23/21 10:58 AM  Result Value Ref Range   WBC 9.4 4.0 - 10.5 K/uL   RBC 5.84 (H) 4.22 - 5.81 MIL/uL   Hemoglobin 14.7 13.0 - 17.0 g/dL   HCT 44.7 39.0 - 52.0 %   MCV 76.5 (L) 80.0 - 100.0 fL   MCH 25.2 (L) 26.0 - 34.0 pg   MCHC 32.9 30.0 - 36.0 g/dL   RDW 12.9 11.5 - 15.5 %   Platelets 254 150 - 400 K/uL   nRBC 0.0 0.0 - 0.2 %    Comment: Performed at [facility], [address] [place], [place] [address]  Comprehensive metabolic panel     Status: Abnormal   Collection Time: 12/23/21 10:58 AM  Result Value Ref Range   Sodium 132 (L) 135 - 145 mmol/L   Potassium 4.4 3.5 - 5.1 mmol/L   Chloride 97 (L) 98 - 111 mmol/L   CO2 26 22 - 32 mmol/L   Glucose, Bld 575 (HH) 70 - 99 mg/dL    Comment:  CRITICAL RESULT CALLED TO, READ BACK BY AND VERIFIED WITH KATIE FERGUSSON AT  1141 12/23/21 DAS Glucose reference range applies only to samples taken after fasting for at least 8 hours.    BUN 24 (H) 6 - 20 mg/dL   Creatinine, Ser 1.10 0.61 - 1.24 mg/dL   Calcium 9.5 8.9 - 10.3 mg/dL   Total Protein 6.6 6.5 - 8.1 g/dL   Albumin 3.6 3.5 - 5.0 g/dL   AST 27 15 - 41 U/L   ALT 38 0 - 44 U/L   Alkaline Phosphatase 76 38 - 126 U/L   Total Bilirubin 1.0 0.3 - 1.2 mg/dL   GFR, Estimated >60 >60 mL/min    Comment: (NOTE) Calculated using the CKD-EPI Creatinine Equation (2021)    Anion gap 9 5 - 15    Comment: Performed at [facility], [address] [place], [place] [address]  CBG monitoring, ED     Status: Abnormal   Collection Time: 12/23/21 12:03 PM  Result Value Ref Range   Glucose-Capillary 526 (HH) 70 - 99 mg/dL    Comment: Glucose reference range applies only to samples taken after fasting for at least 8 hours.   Comment 1 Document in Chart   CBG monitoring, ED     Status: Abnormal   Collection Time: 12/23/21  1:15 PM  Result Value Ref Range   Glucose-Capillary 317 (H) 70 - 99 mg/dL    Comment: Glucose reference range applies only to samples taken after fasting for at least 8 hours.  CBG monitoring, ED     Status: Abnormal   Collection Time: 12/23/21  2:40 PM  Result Value Ref Range   Glucose-Capillary 268 (H) 70 - 99 mg/dL    Comment: Glucose reference range applies only to samples taken after fasting for at least 8 hours.   Objective  Body mass index is 26.55 kg/m. Wt Readings from Last 3 Encounters:  12/25/21 174 lb 9.6 oz (79.2 kg)  12/23/21 180 lb (81.6 kg)  12/22/21 190 lb (86.2 kg)   Temp Readings from Last 3 Encounters:  12/25/21 97.7 F (36.5 C) (Oral)  12/23/21 98 F (36.7 C) (Oral)  12/22/21 97.7 F (36.5 C) (Oral)   BP Readings from Last 3 Encounters:  12/25/21 110/60  12/23/21 (!) 114/58  12/22/21 136/72   Pulse Readings from Last 3  Encounters:  12/25/21 71  12/23/21 69  12/22/21 86  Physical Exam Vitals and nursing note reviewed.  Constitutional:      Appearance: Normal appearance. He is well-developed and well-groomed.  HENT:     Head: Normocephalic and atraumatic.     Mouth/Throat:     Dentition: Dental caries present.  Eyes:     Conjunctiva/sclera: Conjunctivae normal.     Pupils: Pupils are equal, round, and reactive to light.  Cardiovascular:     Rate and Rhythm: Normal rate and regular rhythm.     Heart sounds: Normal heart sounds.  Pulmonary:     Effort: Pulmonary effort is normal. No respiratory distress.     Breath sounds: Normal breath sounds.  Abdominal:     Tenderness: There is no abdominal tenderness.  Skin:    General: Skin is warm and moist.  Neurological:     General: No focal deficit present.     Mental Status: He is alert and oriented to person, place, and time. Mental status is at baseline.     Sensory: Sensation is intact.     Motor: Motor function is intact.     Coordination: Coordination is intact.     Gait: Gait is intact. Gait normal.  Psychiatric:        Attention and Perception: Attention and perception normal.        Mood and Affect: Mood and affect normal.        Speech: Speech normal.        Behavior: Behavior normal. Behavior is cooperative.        Thought Content: Thought content normal.        Cognition and Memory: Cognition and memory normal.        Judgment: Judgment normal.    Assessment  Plan  Hypertension associated with diabetes ([place]) with hyperglycemia- Plan: Semaglutide,0.5MG/DOS increase from 0.25 2 MG/3ML SOPN, Basic Metabolic Panel (BMET), Hemoglobin A1c, blood glucose meter kit and supplies, Given meter one touch verio reflect meter today strips expired sent to pharmacy  Hypomagnesemia - Plan: Magnesium r/o DKA due to dental infection  Dental decay  Rx augmentin establish dental care [facility] Department in [place] [address] [place], [place], [place] [address] Phone: [phone]  [place] [address], [place], [place] [address] Hours:  Open ? Closes 5?PM Phone: [phone] Provider: Dr. Olivia Mackie McLean-Scocuzza-Internal Medicine

## 2021-12-25 NOTE — Patient Instructions (Addendum)
[facility] [place] [address], [place], [place] [address] Phone: [phone]  [facility] [address], [place], [place] [address] Hours:  Open ? Closes 5?PM Phone: [phone]  Hyperglycemia Hyperglycemia occurs when the level of sugar (glucose) in the blood is too high. Glucose is a type of sugar that provides the body's main source of energy. Certain hormones (insulin and glucagon) control the level of glucose in the blood. Insulin lowers blood glucose, and glucagon increases blood glucose. Hyperglycemia can result from not having enough insulin in the bloodstream, or from the body not responding normally to insulin. Hyperglycemia occurs most often in people who have diabetes (diabetes mellitus), but it can happen in people who do not have diabetes. It can develop quickly, and it can be life-threatening if it causes you to become severely dehydrated (diabetic ketoacidosis or hyperglycemic hyperosmolar state). Severe hyperglycemia is a medical emergency. For most people with diabetes, a blood glucose level above 240 mg/dL is considered hyperglycemia. What are the causes? If you have diabetes, hyperglycemia may be caused by: Medicines that increase blood glucose or affect your diabetes control. Getting less physical activity. Eating more than planned. Being sick or injured, having an infection, or having surgery. Stress. Not giving yourself enough insulin (if you are taking insulin). If you have undiagnosed diabetes, this may be the reason you have hyperglycemia. If you do not have diabetes, hyperglycemia may be caused by: Certain medicines, including: Steroid medicines. Beta-blockers. Epinephrine. Thiazide diuretics. Stress. Having a serious illness, an infection, or surgery. Diseases of the pancreas. What increases the risk? Hyperglycemia is more likely to develop in people who have risk factors for diabetes, such as: Having a  family member with diabetes. Certain conditions in which the body's disease-fighting system (immune system) attacks itself (autoimmune disorders). Being overweight or obese. Having an inactive (sedentary) lifestyle. Having been diagnosed with insulin resistance. Having a history of prediabetes, gestational diabetes, or polycystic ovarian syndrome (PCOS). What are the signs or symptoms? Hyperglycemia may not cause any symptoms. If you do have symptoms, they may include: Increased thirst. Needing to urinate more often than usual. Hunger. Feeling very tired. Blurry vision. Other symptoms may develop if hyperglycemia gets worse, such as: Dry mouth. Abdominal pain. Loss of appetite. Fruity-smelling breath. Weakness. Unexpected weight loss. Tingling or numbness in the hands or feet. Headache. Cuts or bruises that are slow to heal. How is this diagnosed? Hyperglycemia is diagnosed with a blood test to measure your blood glucose level. This blood test is usually done while you are having symptoms. Your health care provider may also do a physical exam and review your medical history. You may have more tests to determine the cause of your hyperglycemia, such as: A fasting blood glucose (FBG) test. You will not be allowed to eat (you will fast) for at least 8 hours before a blood sample is taken. An A1C blood test. This provides information about blood glucose control over the previous 2-3 months. An oral glucose tolerance test (OGTT). This measures your blood glucose at two times: After fasting. This is your baseline blood glucose level. 2 hours after drinking a beverage that contains glucose. How is this treated? Treatment depends on the cause of your hyperglycemia. Treatment may include: Taking medicine to regulate your blood glucose levels. If you take insulin or other diabetes medicines, your medicine or dosage may be adjusted. Lifestyle changes, such as exercising more, eating healthier  foods, or losing weight. Treating  an illness or infection. Checking your blood glucose more often. Stopping or reducing steroid medicines. If your hyperglycemia becomes severe and it results in diabetic ketoacidosis or hyperglycemic hyperosmolar state, you must be hospitalized and given IV fluids and IV insulin. Follow these instructions at home: General instructions Take over-the-counter and prescription medicines only as told by your health care provider. Do not use any products that contain nicotine or tobacco. These products include cigarettes, chewing tobacco, and vaping devices, such as e-cigarettes. If you need help quitting, ask your health care provider. If you drink alcohol: Limit how much you have to: 0-1 drink a day for women who are not pregnant. 0-2 drinks a day for men. Know how much alcohol is in a drink. In the [place] one drink equals one 12 oz bottle of beer (355 mL), one 5 oz glass of wine (148 mL), or one 1 oz glass of hard liquor (44 mL). Learn to manage stress. If you need help with this, ask your health care provider. Do exercises as told by your health care provider. Keep all follow-up visits. This is important. Eating and drinking  Maintain a healthy weight. Stay hydrated, especially when you exercise, get sick, or spend time in hot temperatures. Drink enough fluid to keep your urine pale yellow. If you have diabetes:  Know the symptoms of hyperglycemia. Follow your diabetes management plan as told by your health care provider. Make sure you: Take your insulin and medicines as told. Follow your exercise plan. Follow your meal plan. Eat on time, and do not skip meals. Check your blood glucose as often as told. Make sure to check your blood glucose before and after exercise. If you exercise longer or in a different way, check your blood glucose more often. Follow your sick day plan whenever you cannot eat or drink normally. Make this plan in advance with your  health care provider. Share your diabetes management plan with people in your workplace, school, and household. Check your urine for ketones when you are ill and as told by your health care provider. Carry a medical alert card or wear medical alert jewelry. Where to find more information American Diabetes Association: www.diabetes.org Contact a health care provider if: Your blood glucose is at or above 240 mg/dL (67.5 mmol/L) for 2 days in a row. You have problems keeping your blood glucose in your target range. You have frequent episodes of hyperglycemia. You have signs of illness, such as nausea, vomiting, or fever. Get help right away if: Your blood glucose monitor reads "high" even when you are taking insulin. You have trouble breathing. You have a change in how you think, feel, or act (mental status). You have nausea or vomiting that does not go away. These symptoms may represent a serious problem that is an emergency. Do not wait to see if the symptoms will go away. Get medical help right away. Call your local emergency services (911 in the [place] Do not drive yourself to the hospital. Summary Hyperglycemia occurs when the level of sugar (glucose) in the blood is too high. Hyperglycemia can happen with or without diabetes, and severe hyperglycemia can be life-threatening. Hyperglycemia is diagnosed with a blood test to measure your blood glucose level. This blood test is usually done while you are having symptoms. Your health care provider may also do a physical exam and review your medical history. If you have diabetes, follow your diabetes management plan as told by your health care provider. Contact your health  care provider if you have problems keeping your blood glucose in your target range. This information is not intended to replace advice given to you by your health care provider. Make sure you discuss any questions you have with your health care provider. Document Revised:  05/10/2020 Document Reviewed: 05/10/2020 Elsevier Patient Education  2023 [employer].

## 2021-12-26 ENCOUNTER — Telehealth: Payer: Self-pay | Admitting: *Deleted

## 2021-12-26 ENCOUNTER — Emergency Department
Admission: EM | Admit: 2021-12-26 | Discharge: 2021-12-26 | Disposition: A | Discharge status: Home / Self Care | Payer: Self-pay | Attending: Emergency Medicine | Admitting: Emergency Medicine

## 2021-12-26 ENCOUNTER — Other Ambulatory Visit: Payer: Self-pay

## 2021-12-26 DIAGNOSIS — E1165 Type 2 diabetes mellitus with hyperglycemia: Secondary | ICD-10-CM | POA: Insufficient documentation

## 2021-12-26 DIAGNOSIS — R739 Hyperglycemia, unspecified: Secondary | ICD-10-CM

## 2021-12-26 LAB — BASIC METABOLIC PANEL
Anion gap: 9 (ref 5–15)
BUN: 19 mg/dL (ref 6–23)
BUN: 30 mg/dL — ABNORMAL HIGH (ref 6–20)
CO2: 26 mEq/L (ref 19–32)
CO2: 24 mmol/L (ref 22–32)
Calcium: 10.2 mg/dL (ref 8.4–10.5)
Calcium: 9.2 mg/dL (ref 8.9–10.3)
Chloride: 95 mEq/L — ABNORMAL LOW (ref 96–112)
Chloride: 95 mmol/L — ABNORMAL LOW (ref 98–111)
Creatinine, Ser: 1.27 mg/dL (ref 0.40–1.50)
Creatinine, Ser: 1.59 mg/dL — ABNORMAL HIGH (ref 0.61–1.24)
GFR, Estimated: 50 mL/min — ABNORMAL LOW (ref >60)
GFR: 62.39 mL/min (ref >60.00)
Glucose, Bld: 575 mg/dL (ref 70–99)
Glucose, Bld: 715 mg/dL (ref 70–99)
Potassium: 4.3 mEq/L (ref 3.5–5.1)
Potassium: 4.3 mmol/L (ref 3.5–5.1)
Sodium: 131 mEq/L — ABNORMAL LOW (ref 135–145)
Sodium: 128 mmol/L — ABNORMAL LOW (ref 135–145)

## 2021-12-26 LAB — CBC
HCT: 45.8 % (ref 39.0–52.0)
Hemoglobin: 15 g/dL (ref 13.0–17.0)
MCH: 25.1 pg — ABNORMAL LOW (ref 26.0–34.0)
MCHC: 32.8 g/dL (ref 30.0–36.0)
MCV: 76.6 fL — ABNORMAL LOW (ref 80.0–100.0)
Platelets: 249 10*3/uL (ref 150–400)
RBC: 5.98 MIL/uL — ABNORMAL HIGH (ref 4.22–5.81)
RDW: 13.1 % (ref 11.5–15.5)
WBC: 8.2 10*3/uL (ref 4.0–10.5)
nRBC: 0 % (ref 0.0–0.2)

## 2021-12-26 LAB — URINALYSIS, ROUTINE W REFLEX MICROSCOPIC
Bacteria, UA: NONE SEEN
Bilirubin Urine: NEGATIVE
Glucose, UA: >=500 mg/dL — AB
Hgb urine dipstick: NEGATIVE
Ketones, ur: NEGATIVE mg/dL
Leukocytes,Ua: NEGATIVE
Nitrite: NEGATIVE
Protein, ur: NEGATIVE mg/dL
Specific Gravity, Urine: 1.021 (ref 1.005–1.030)
Squamous Epithelial / HPF: NONE SEEN (ref 0–5)
pH: 6 (ref 5.0–8.0)

## 2021-12-26 LAB — CBG MONITORING, ED
Glucose-Capillary: >600 mg/dL (ref 70–99)
Glucose-Capillary: 355 mg/dL — ABNORMAL HIGH (ref 70–99)
Glucose-Capillary: 414 mg/dL — ABNORMAL HIGH (ref 70–99)
Glucose-Capillary: 422 mg/dL — ABNORMAL HIGH (ref 70–99)
Glucose-Capillary: 323 mg/dL — ABNORMAL HIGH (ref 70–99)

## 2021-12-26 LAB — MAGNESIUM: Magnesium: 2.1 mg/dL (ref 1.5–2.5)

## 2021-12-26 LAB — HEMOGLOBIN A1C: Hgb A1c MFr Bld: 15.3 % — ABNORMAL HIGH (ref 4.6–6.5)

## 2021-12-26 MED ORDER — INSULIN ASPART 100 UNIT/ML IJ SOLN
12.0000 [IU] | Freq: Once | INTRAMUSCULAR | Status: AC
  Administered 2021-12-26: 12 [IU] via INTRAVENOUS
  Filled 2021-12-26: qty 1

## 2021-12-26 MED ORDER — INSULIN ASPART 100 UNIT/ML IJ SOLN
10.0000 [IU] | Freq: Once | INTRAMUSCULAR | Status: AC
  Administered 2021-12-26: 10 [IU] via INTRAVENOUS
  Filled 2021-12-26: qty 1

## 2021-12-26 MED ORDER — SODIUM CHLORIDE 0.9 % IV BOLUS
1000.0000 mL | Freq: Once | INTRAVENOUS | Status: AC
Start: 2021-12-26 — End: 2021-12-26
  Administered 2021-12-26: 1000 mL via INTRAVENOUS

## 2021-12-26 MED ORDER — SODIUM CHLORIDE 0.9 % IV BOLUS
1000.0000 mL | Freq: Once | INTRAVENOUS | Status: AC
  Administered 2021-12-26: 1000 mL via INTRAVENOUS

## 2021-12-26 NOTE — Discharge Instructions (Signed)
Please seek medical attention for any high fevers, chest pain, shortness of breath, change in behavior, persistent vomiting, bloody stool or any other new or concerning symptoms.  

## 2021-12-26 NOTE — ED Provider Notes (Signed)
[facility] Provider Note    Event Date/Time   First MD Initiated Contact with Patient 12/26/21 1646     (approximate)   History   Hyperglycemia   HPI  Alexander Powers is a 56 y.o. male  who, per primary care note  from yesterday has history of DMs and elevated blood sugar who presents to the emergency department today because of concern for high blood sugar. The patient went to his PCP office yesterday where they checked his labs. He denies any symptoms save for some eye sensativity. Had been out of his medication for about a month but got it refilled and was able to take his first dose two days ago. The patient was given antibiotics by his PCP for possible tooth infection. He denies any fevers. Denies any nausea or vomiting.    Physical Exam   Triage Vital Signs: ED Triage Vitals  Enc Vitals Group     BP 12/26/21 1456 139/69     Pulse Rate 12/26/21 1456 73     Resp 12/26/21 1456 18     Temp 12/26/21 1456 98 F (36.7 C)     Temp Source 12/26/21 1456 Oral     SpO2 12/26/21 1456 96 %     Weight 12/26/21 1502 174 lb 6.1 oz (79.1 kg)     Height 12/26/21 1502 5\' 8"  (1.727 m)     Head Circumference --      Peak Flow --      Pain Score 12/26/21 1502 0                Most recent vital signs: Vitals:   12/26/21 1456  BP: 139/69  Pulse: 73  Resp: 18  Temp: 98 F (36.7 C)  SpO2: 96%    General: Awake, alert, oriented. CV:  Good peripheral perfusion. Regular rate and rhythm Resp:  Normal effort. Lungs clear. Abd:  No distention. Non tender.    ED Results / Procedures / Treatments   Labs (all labs ordered are listed, but only abnormal results are displayed) Labs Reviewed  BASIC METABOLIC PANEL - Abnormal; Notable for the following components:      Result Value   Sodium 128 (*)    Chloride 95 (*)    Glucose, Bld 715 (*)    BUN 30 (*)    Creatinine, Ser 1.59 (*)    GFR, Estimated 50 (*)    All other components within normal limits   CBC - Abnormal; Notable for the following components:   RBC 5.98 (*)    MCV 76.6 (*)    MCH 25.1 (*)    All other components within normal limits  URINALYSIS, ROUTINE W REFLEX MICROSCOPIC - Abnormal; Notable for the following components:   Color, Urine COLORLESS (*)    APPearance CLEAR (*)    Glucose, UA >=500 (*)    All other components within normal limits  CBG MONITORING, ED - Abnormal; Notable for the following components:   Glucose-Capillary >600 (*)    All other components within normal limits  CBG MONITORING, ED  CBG MONITORING, ED     EKG  None   RADIOLOGY None   PROCEDURES:  Critical Care performed: No  Procedures   MEDICATIONS ORDERED IN ED: Medications - No data to display   IMPRESSION / MDM / ASSESSMENT AND PLAN / ED COURSE  I reviewed the triage vital signs and the nursing notes.  Differential diagnosis includes, but is not limited to, hyperglycemia, DKA, infection.  Patient presents to the emergency department today because of concerns for hyperglycemia.  Patient has recently restarted his diabetes medication.  Was seen in the emergency department a couple days ago for high blood sugar.  No evidence of DKA and work-up today.  Patient was given IV fluids as well as insulin and sugars to improve.  Did discuss possible admission with patient although stated he would like to be discharged home.  We will plan on discharging home.   FINAL CLINICAL IMPRESSION(S) / ED DIAGNOSES   Final diagnoses:  Hyperglycemia     Note:  This document was prepared using Dragon voice recognition software and may include unintentional dictation errors.    Phineas Semen, MD 12/27/21 0001

## 2021-12-26 NOTE — ED Notes (Signed)
Patient verbalized discharge understanding  

## 2021-12-26 NOTE — Telephone Encounter (Signed)
CRITICAL VALUE STICKER  CRITICAL VALUE: [id number]  RECEIVER (on-site recipient of call): Silvestre Moment, CMA/XT  DATE & TIME NOTIFIED: 12/26/21 @ 10:46am  MESSENGER (representative from lab): Sai @ [facility]  MD NOTIFIED: Dr. Lonie Peak  TIME OF NOTIFICATION: 10:48am  RESPONSE:

## 2021-12-26 NOTE — ED Notes (Signed)
Patient sitting on side of bed, watching television. Patient denies any needs at this time.

## 2021-12-26 NOTE — ED Triage Notes (Signed)
Pt to ED for high blood sugar. Pt is type 2 DM and takes weekly injections for BG control but has been otu of meds for 1 month. Pt saw PCP yesterday and got call today with BG 500+.  CBG reading here reading "HI".  Pt denies pain, N/V. Endorses feeling dizzy since over 1 week.

## 2021-12-26 NOTE — Telephone Encounter (Signed)
Pts sugar is still extremely high rec he go to the hospital again and be admitted to regulate it

## 2021-12-29 NOTE — Telephone Encounter (Signed)
Opened in error

## 2021-12-31 ENCOUNTER — Other Ambulatory Visit: Payer: Self-pay

## 2022-02-04 ENCOUNTER — Other Ambulatory Visit: Payer: Self-pay

## 2022-02-11 ENCOUNTER — Other Ambulatory Visit: Payer: Self-pay

## 2022-02-11 ENCOUNTER — Encounter: Payer: Self-pay | Admitting: Internal Medicine

## 2022-02-11 ENCOUNTER — Ambulatory Visit (INDEPENDENT_AMBULATORY_CARE_PROVIDER_SITE_OTHER): Payer: Self-pay | Admitting: Internal Medicine

## 2022-02-11 VITALS — BP 110/50 | HR 73 | Temp 98.1°F | Resp 14 | Ht 68.0 in | Wt 174.2 lb

## 2022-02-11 DIAGNOSIS — E1159 Type 2 diabetes mellitus with other circulatory complications: Secondary | ICD-10-CM

## 2022-02-11 DIAGNOSIS — B353 Tinea pedis: Secondary | ICD-10-CM

## 2022-02-11 DIAGNOSIS — B356 Tinea cruris: Secondary | ICD-10-CM

## 2022-02-11 DIAGNOSIS — I152 Hypertension secondary to endocrine disorders: Secondary | ICD-10-CM

## 2022-02-11 DIAGNOSIS — R7989 Other specified abnormal findings of blood chemistry: Secondary | ICD-10-CM

## 2022-02-11 MED ORDER — FLUCONAZOLE 150 MG PO TABS
150.0000 mg | ORAL_TABLET | ORAL | 0 refills | Status: DC
  Filled 2022-02-11: qty 6, 42d supply, fill #0

## 2022-02-11 MED ORDER — CLOTRIMAZOLE-BETAMETHASONE 1-0.05 % EX CREA: 1.0000 | TOPICAL_CREAM | Freq: Every day | CUTANEOUS | 2 refills | Status: AC

## 2022-02-11 MED ORDER — FLUCONAZOLE 150 MG PO TABS
150.0000 mg | ORAL_TABLET | ORAL | 0 refills | Status: AC
  Filled 2022-02-11 (×2): qty 6, 42d supply, fill #0

## 2022-02-11 NOTE — Patient Instructions (Addendum)
Dr. Clent Ridges new PCP   Dental appts  [facility] [place] [facility] [phone]   [employer] bond talc free powder for socks shoes   Hyperglycemia Hyperglycemia occurs when the level of sugar (glucose) in the blood is too high. Glucose is a type of sugar that provides the body's main source of energy. Certain hormones (insulin and glucagon) control the level of glucose in the blood. Insulin lowers blood glucose, and glucagon increases blood glucose. Hyperglycemia can result from not having enough insulin in the bloodstream, or from the body not responding normally to insulin. Hyperglycemia occurs most often in people who have diabetes (diabetes mellitus), but it can happen in people who do not have diabetes. It can develop quickly, and it can be life-threatening if it causes you to become severely dehydrated (diabetic ketoacidosis or hyperglycemic hyperosmolar state). Severe hyperglycemia is a medical emergency. For most people with diabetes, a blood glucose level above 240 mg/dL is considered hyperglycemia. What are the causes? If you have diabetes, hyperglycemia may be caused by: Medicines that increase blood glucose or affect your diabetes control. Getting less physical activity. Eating more than planned. Being sick or injured, having an infection, or having surgery. Stress. Not giving yourself enough insulin (if you are taking insulin). If you have undiagnosed diabetes, this may be the reason you have hyperglycemia. If you do not have diabetes, hyperglycemia may be caused by: Certain medicines, including: Steroid medicines. Beta-blockers. Epinephrine. Thiazide diuretics. Stress. Having a serious illness, an infection, or surgery. Diseases of the pancreas. What increases the risk? Hyperglycemia is more likely to develop in people who have risk factors for diabetes, such as: Having a family member with  diabetes. Certain conditions in which the body's disease-fighting system (immune system) attacks itself (autoimmune disorders). Being overweight or obese. Having an inactive (sedentary) lifestyle. Having been diagnosed with insulin resistance. Having a history of prediabetes, gestational diabetes, or polycystic ovarian syndrome (PCOS). What are the signs or symptoms? Hyperglycemia may not cause any symptoms. If you do have symptoms, they may include: Increased thirst. Needing to urinate more often than usual. Hunger. Feeling very tired. Blurry vision. Other symptoms may develop if hyperglycemia gets worse, such as: Dry mouth. Abdominal pain. Loss of appetite. Fruity-smelling breath. Weakness. Unexpected weight loss. Tingling or numbness in the hands or feet. Headache. Cuts or bruises that are slow to heal. How is this diagnosed? Hyperglycemia is diagnosed with a blood test to measure your blood glucose level. This blood test is usually done while you are having symptoms. Your health care provider may also do a physical exam and review your medical history. You may have more tests to determine the cause of your hyperglycemia, such as: A fasting blood glucose (FBG) test. You will not be allowed to eat (you will fast) for at least 8 hours before a blood sample is taken. An A1C blood test. This provides information about blood glucose control over the previous 2-3 months. An oral glucose tolerance test (OGTT). This measures your blood glucose at two times: After fasting. This is your baseline blood glucose level. 2 hours after drinking a beverage that contains glucose. How is this treated? Treatment depends on the cause of your hyperglycemia. Treatment may include: Taking medicine to regulate your blood glucose levels. If you take insulin or other diabetes medicines, your medicine or dosage may be adjusted. Lifestyle changes, such as exercising more, eating healthier foods, or losing  weight. Treating an  illness or infection. Checking your blood glucose more often. Stopping or reducing steroid medicines. If your hyperglycemia becomes severe and it results in diabetic ketoacidosis or hyperglycemic hyperosmolar state, you must be hospitalized and given IV fluids and IV insulin. Follow these instructions at home: General instructions Take over-the-counter and prescription medicines only as told by your health care provider. Do not use any products that contain nicotine or tobacco. These products include cigarettes, chewing tobacco, and vaping devices, such as e-cigarettes. If you need help quitting, ask your health care provider. If you drink alcohol: Limit how much you have to: 0-1 drink a day for women who are not pregnant. 0-2 drinks a day for men. Know how much alcohol is in a drink. In the [place] one drink equals one 12 oz bottle of beer (355 mL), one 5 oz glass of wine (148 mL), or one 1 oz glass of hard liquor (44 mL). Learn to manage stress. If you need help with this, ask your health care provider. Do exercises as told by your health care provider. Keep all follow-up visits. This is important. Eating and drinking  Maintain a healthy weight. Stay hydrated, especially when you exercise, get sick, or spend time in hot temperatures. Drink enough fluid to keep your urine pale yellow. If you have diabetes:  Know the symptoms of hyperglycemia. Follow your diabetes management plan as told by your health care provider. Make sure you: Take your insulin and medicines as told. Follow your exercise plan. Follow your meal plan. Eat on time, and do not skip meals. Check your blood glucose as often as told. Make sure to check your blood glucose before and after exercise. If you exercise longer or in a different way, check your blood glucose more often. Follow your sick day plan whenever you cannot eat or drink normally. Make this plan in advance with your health care  provider. Share your diabetes management plan with people in your workplace, school, and household. Check your urine for ketones when you are ill and as told by your health care provider. Carry a medical alert card or wear medical alert jewelry. Where to find more information American Diabetes Association: www.diabetes.org Contact a health care provider if: Your blood glucose is at or above 240 mg/dL (74.2 mmol/L) for 2 days in a row. You have problems keeping your blood glucose in your target range. You have frequent episodes of hyperglycemia. You have signs of illness, such as nausea, vomiting, or fever. Get help right away if: Your blood glucose monitor reads "high" even when you are taking insulin. You have trouble breathing. You have a change in how you think, feel, or act (mental status). You have nausea or vomiting that does not go away. These symptoms may represent a serious problem that is an emergency. Do not wait to see if the symptoms will go away. Get medical help right away. Call your local emergency services (911 in the [place] Do not drive yourself to the hospital. Summary Hyperglycemia occurs when the level of sugar (glucose) in the blood is too high. Hyperglycemia can happen with or without diabetes, and severe hyperglycemia can be life-threatening. Hyperglycemia is diagnosed with a blood test to measure your blood glucose level. This blood test is usually done while you are having symptoms. Your health care provider may also do a physical exam and review your medical history. If you have diabetes, follow your diabetes management plan as told by your health care provider. Contact your health care  provider if you have problems keeping your blood glucose in your target range. This information is not intended to replace advice given to you by your health care provider. Make sure you discuss any questions you have with your health care provider. Document Revised: 05/10/2020  Document Reviewed: 05/10/2020 Elsevier Patient Education  2023 [employer].

## 2022-02-11 NOTE — Progress Notes (Signed)
Chief Complaint  Patient presents with   Follow-up    4 mon diabetes. Pt was at [facility] for elevated glucose was given IV insulin which helped. Glucose lvls at home around 114-140   F/u  1. 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 Dentition poor given # to health dept  Dental appts  [employer] [facility] in [facility] health department/[facility] [phone]  3. Tinea pedis wants refill of diflucan which helped he does sweat a lot on his feet    Review of Systems  Constitutional:  Negative for weight loss.  HENT:  Negative for hearing loss.   Eyes:  Negative for blurred vision.  Respiratory:  Negative for shortness of breath.   Cardiovascular:  Negative for chest pain.  Gastrointestinal:  Negative for abdominal pain and blood in stool.  Musculoskeletal:  Negative for back pain.  Skin:  Negative for rash.  Neurological:  Negative for dizziness and headaches.  Psychiatric/Behavioral:  Negative for depression.    Past Medical History:  Diagnosis Date   Diabetes mellitus without complication ([place])    Difficult intubation    GERD (gastroesophageal reflux disease)    OCC- TUMS   Glaucoma    Hypercholesteremia    Hypertension    Strep throat    01/24/18    Past Surgical History:  Procedure Laterality Date   HERNIA REPAIR     INSERTION OF MESH N/A 10/22/2017   Procedure: INSERTION OF MESH;  Surgeon: Vickie Epley, MD;  Location: [facility] ORS;  Service: General;  Laterality: N/A;   UMBILICAL HERNIA REPAIR N/A 10/22/2017   Procedure: HERNIA REPAIR UMBILICAL ADULT WITH MESH;  Surgeon: Vickie Epley, MD;  Location: [facility] ORS;  Service: General;  Laterality: N/A;   Family History  Problem Relation Age of Onset   Diabetes Father    Hyperlipidemia  Father    Hypertension Father    Kidney disease Father    Diabetes Paternal Uncle    Social History   Socioeconomic History   Marital status: Single    Spouse name: Not on file   Number of children: Not on file   Years of education: Not on file   Highest education level: Not on file  Occupational History   Not on file  Tobacco Use   Smoking status: Every Day    Packs/day: 0.50    Years: 27.00    Total pack years: 13.50    Types: Cigarettes   Smokeless tobacco: Never   Tobacco comments:    age 51-37 1/2 ppd quit age 66   Vaping Use   Vaping Use: Never used  Substance and Sexual Activity   Alcohol use: Yes    Comment: occasionally   Drug use: Not Currently    Types: Marijuana   Sexual activity: Not on file  Other Topics Concern   Not on file  Social History Narrative   GED, Glass blower/designer    No guns    Wears seat belt    Safe in relationship engaged to [employer]    Works [employer]   -out of work since 04/2019 on disability       Lives with girlfriend [place]  Financial Resource Strain: Not on file  Food Insecurity: Not on file  Transportation Needs: Not on file  Physical Activity: Not on file  Stress: Not on file  Social Connections: Not on file  Intimate Partner Violence: Not on file   Current Meds  Medication Sig   acetaminophen (TYLENOL) 500 MG tablet Take 1 tablet (500 mg total) by mouth every 4 (four) hours as needed.   amLODipine (NORVASC) 2.5 MG tablet Take 1 tablet (2.5 mg total) by mouth once daily.   amoxicillin-clavulanate (AUGMENTIN) 875-125 MG tablet Take 1 tablet by mouth 2 (two) times daily.   blood glucose meter kit and supplies KIT Use up to four times daily as directed.   blood glucose meter kit and supplies USE AS DIRECTED TO CHECK BLOOD SUGAR UP TO 4 TIMES DAILY.   cetirizine (ZYRTEC) 10 MG tablet Take 1 tablet (10 mg total) by mouth daily as needed for allergies.   Clotrimazole 1 % OINT  Apply 1 application topically 2 (two) times daily. Right groin   clotrimazole-betamethasone (LOTRISONE) cream Apply 1 application topically once daily.   Elastic Bandages & Supports (TENNIS ELBOW STRAP) MISC Ear if on R proximal forearm during arm use for 7-10 days   glucose blood (RIGHTEST GS550 BLOOD GLUCOSE) test strip Use up to 4 times daily as directed.   hydrochlorothiazide (HYDRODIURIL) 25 MG tablet Take 1 tablet (25 mg total) by mouth once daily. (Take with losartan 155m).   hydrocortisone 2.5 % cream Apply topically 2 (two) times daily as needed to right groin   losartan (COZAAR) 100 MG tablet Take 1 tablet (100 mg total) by mouth once daily. (Take with hydrochlorothizide 239m.   meclizine (ANTIVERT) 25 MG tablet Take 1 tablet (25 mg total) by mouth 3 (three) times daily as needed for dizziness.   Rightest GL300 Lancets MISC Use up to 4 times daily as directed.   rosuvastatin (CRESTOR) 10 MG tablet Take 1 tablet (10 mg total) by mouth once daily.   Semaglutide,0.25 or 0.5MG/DOS, 2 MG/3ML SOPN Inject 0.5 mg into the skin once every 7 (seven) days.   triamcinolone cream (KENALOG) 0.1 % Apply 1 application topically 2 (two) times daily as needed to right abdomen.   [DISCONTINUED] fluconazole (DIFLUCAN) 150 MG tablet Take 1 tablet (150 mg total) by mouth once a week.   [DISCONTINUED] losartan-hydrochlorothiazide (HYZAAR) 100-25 MG tablet Take 1 tablet by mouth once daily.   No Known Allergies Recent Results (from the past 2160 hour(s))  Glucose, capillary     Status: Abnormal   Collection Time: 12/22/21  8:05 PM  Result Value Ref Range   Glucose-Capillary >600 (HH) 70 - 99 mg/dL    Comment: Glucose reference range applies only to samples taken after fasting for at least 8 hours.   Comment 1 Repeat Test   Glucose, capillary     Status: Abnormal   Collection Time: 12/22/21  8:06 PM  Result Value Ref Range   Glucose-Capillary >600 (HH) 70 - 99 mg/dL    Comment: Glucose reference range  applies only to samples taken after fasting for at least 8 hours.  CBG monitoring, ED     Status: Abnormal   Collection Time: 12/23/21 10:03 AM  Result Value Ref Range   Glucose-Capillary 576 (HH) 70 - 99 mg/dL    Comment: Glucose reference range applies only to samples taken after fasting for at least 8 hours.  Urinalysis, Routine w reflex microscopic     Status: Abnormal   Collection Time:  12/23/21 10:57 AM  Result Value Ref Range   Color, Urine STRAW (A) YELLOW   APPearance CLEAR (A) CLEAR   Specific Gravity, Urine 1.029 1.005 - 1.030   pH 6.0 5.0 - 8.0   Glucose, UA >=500 (A) NEGATIVE mg/dL   Hgb urine dipstick NEGATIVE NEGATIVE   Bilirubin Urine NEGATIVE NEGATIVE   Ketones, ur NEGATIVE NEGATIVE mg/dL   Protein, ur NEGATIVE NEGATIVE mg/dL   Nitrite NEGATIVE NEGATIVE   Leukocytes,Ua NEGATIVE NEGATIVE   RBC / HPF 0-5 0 - 5 RBC/hpf   WBC, UA 0-5 0 - 5 WBC/hpf   Bacteria, UA NONE SEEN NONE SEEN   Squamous Epithelial / LPF NONE SEEN 0 - 5    Comment: Performed at [facility], [address] [place], [place] [address]  CBC     Status: Abnormal   Collection Time: 12/23/21 10:58 AM  Result Value Ref Range   WBC 9.4 4.0 - 10.5 K/uL   RBC 5.84 (H) 4.22 - 5.81 MIL/uL   Hemoglobin 14.7 13.0 - 17.0 g/dL   HCT 44.7 39.0 - 52.0 %   MCV 76.5 (L) 80.0 - 100.0 fL   MCH 25.2 (L) 26.0 - 34.0 pg   MCHC 32.9 30.0 - 36.0 g/dL   RDW 12.9 11.5 - 15.5 %   Platelets 254 150 - 400 K/uL   nRBC 0.0 0.0 - 0.2 %    Comment: Performed at [facility], [address] [place], [place] [address]  Comprehensive metabolic panel     Status: Abnormal   Collection Time: 12/23/21 10:58 AM  Result Value Ref Range   Sodium 132 (L) 135 - 145 mmol/L   Potassium 4.4 3.5 - 5.1 mmol/L   Chloride 97 (L) 98 - 111 mmol/L   CO2 26 22 - 32 mmol/L   Glucose, Bld 575 (HH) 70 - 99 mg/dL    Comment: CRITICAL RESULT CALLED TO, READ BACK BY AND VERIFIED WITH KATIE FERGUSSON AT  1141 12/23/21  DAS Glucose reference range applies only to samples taken after fasting for at least 8 hours.    BUN 24 (H) 6 - 20 mg/dL   Creatinine, Ser 1.10 0.61 - 1.24 mg/dL   Calcium 9.5 8.9 - 10.3 mg/dL   Total Protein 6.6 6.5 - 8.1 g/dL   Albumin 3.6 3.5 - 5.0 g/dL   AST 27 15 - 41 U/L   ALT 38 0 - 44 U/L   Alkaline Phosphatase 76 38 - 126 U/L   Total Bilirubin 1.0 0.3 - 1.2 mg/dL   GFR, Estimated >60 >60 mL/min    Comment: (NOTE) Calculated using the CKD-EPI Creatinine Equation (2021)    Anion gap 9 5 - 15    Comment: Performed at [facility], [address] [place], [place] [address]  CBG monitoring, ED     Status: Abnormal   Collection Time: 12/23/21 12:03 PM  Result Value Ref Range   Glucose-Capillary 526 (HH) 70 - 99 mg/dL    Comment: Glucose reference range applies only to samples taken after fasting for at least 8 hours.   Comment 1 Document in Chart   CBG monitoring, ED     Status: Abnormal   Collection Time: 12/23/21  1:15 PM  Result Value Ref Range   Glucose-Capillary 317 (H) 70 - 99 mg/dL    Comment: Glucose reference range applies only to samples taken after fasting for at least 8 hours.  CBG monitoring, ED     Status: Abnormal  Collection Time: 12/23/21  2:40 PM  Result Value Ref Range   Glucose-Capillary 268 (H) 70 - 99 mg/dL    Comment: Glucose reference range applies only to samples taken after fasting for at least 8 hours.  Basic Metabolic Panel (BMET)     Status: Abnormal   Collection Time: 12/25/21  3:41 PM  Result Value Ref Range   Sodium 131 (L) 135 - 145 mEq/L   Potassium 4.3 3.5 - 5.1 mEq/L   Chloride 95 (L) 96 - 112 mEq/L   CO2 26 19 - 32 mEq/L   Glucose, Bld 575 (HH) 70 - 99 mg/dL   BUN 19 6 - 23 mg/dL   Creatinine, Ser 1.27 0.40 - 1.50 mg/dL   GFR 62.39 >60.00 mL/min    Comment: Calculated using the CKD-EPI Creatinine Equation (2021)   Calcium 10.2 8.4 - 10.5 mg/dL  Magnesium     Status: None   Collection Time: 12/25/21  3:41 PM  Result  Value Ref Range   Magnesium 2.1 1.5 - 2.5 mg/dL  Hemoglobin A1c     Status: Abnormal   Collection Time: 12/25/21  3:41 PM  Result Value Ref Range   Hgb A1c MFr Bld 15.3 Repeated and verified X2. (H) 4.6 - 6.5 %    Comment: Glycemic Control Guidelines for People with Diabetes:Non Diabetic:  <6%Goal of Therapy: <7%Additional Action Suggested:  >1%   Basic metabolic panel     Status: Abnormal   Collection Time: 12/26/21  2:57 PM  Result Value Ref Range   Sodium 128 (L) 135 - 145 mmol/L   Potassium 4.3 3.5 - 5.1 mmol/L   Chloride 95 (L) 98 - 111 mmol/L   CO2 24 22 - 32 mmol/L   Glucose, Bld 715 (HH) 70 - 99 mg/dL    Comment: CRITICAL RESULT CALLED TO, READ BACK BY AND VERIFIED WITH AMBER PAYNE AT 1541 ON 12/26/21 BY SS Glucose reference range applies only to samples taken after fasting for at least 8 hours.    BUN 30 (H) 6 - 20 mg/dL   Creatinine, Ser 1.59 (H) 0.61 - 1.24 mg/dL   Calcium 9.2 8.9 - 10.3 mg/dL   GFR, Estimated 50 (L) >60 mL/min    Comment: (NOTE) Calculated using the CKD-EPI Creatinine Equation (2021)    Anion gap 9 5 - 15    Comment: Performed at [facility], [address] [place], [place] [address]  CBC     Status: Abnormal   Collection Time: 12/26/21  2:57 PM  Result Value Ref Range   WBC 8.2 4.0 - 10.5 K/uL   RBC 5.98 (H) 4.22 - 5.81 MIL/uL   Hemoglobin 15.0 13.0 - 17.0 g/dL   HCT 45.8 39.0 - 52.0 %   MCV 76.6 (L) 80.0 - 100.0 fL   MCH 25.1 (L) 26.0 - 34.0 pg   MCHC 32.8 30.0 - 36.0 g/dL   RDW 13.1 11.5 - 15.5 %   Platelets 249 150 - 400 K/uL   nRBC 0.0 0.0 - 0.2 %    Comment: Performed at [facility], [address] [place], [place] [address]  CBG monitoring, ED     Status: Abnormal   Collection Time: 12/26/21  3:00 PM  Result Value Ref Range   Glucose-Capillary >600 (HH) 70 - 99 mg/dL    Comment: Glucose reference range applies only to samples taken after fasting for at least 8 hours.   Comment 1 Notify RN    Comment 2 Document  in Chart   Urinalysis, Routine w reflex microscopic Urine, Clean Catch     Status: Abnormal   Collection Time: 12/26/21  3:04 PM  Result Value Ref Range   Color, Urine COLORLESS (A) YELLOW   APPearance CLEAR (A) CLEAR   Specific Gravity, Urine 1.021 1.005 - 1.030   pH 6.0 5.0 - 8.0   Glucose, UA >=500 (A) NEGATIVE mg/dL   Hgb urine dipstick NEGATIVE NEGATIVE   Bilirubin Urine NEGATIVE NEGATIVE   Ketones, ur NEGATIVE NEGATIVE mg/dL   Protein, ur NEGATIVE NEGATIVE mg/dL   Nitrite NEGATIVE NEGATIVE   Leukocytes,Ua NEGATIVE NEGATIVE   RBC / HPF 0-5 0 - 5 RBC/hpf   WBC, UA 0-5 0 - 5 WBC/hpf   Bacteria, UA NONE SEEN NONE SEEN   Squamous Epithelial / LPF NONE SEEN 0 - 5    Comment: Performed at [facility], [address] [place], [place] [address]  CBG monitoring, ED     Status: Abnormal   Collection Time: 12/26/21  6:25 PM  Result Value Ref Range   Glucose-Capillary 355 (H) 70 - 99 mg/dL    Comment: Glucose reference range applies only to samples taken after fasting for at least 8 hours.  CBG monitoring, ED     Status: Abnormal   Collection Time: 12/26/21  8:38 PM  Result Value Ref Range   Glucose-Capillary 414 (H) 70 - 99 mg/dL    Comment: Glucose reference range applies only to samples taken after fasting for at least 8 hours.   Comment 1 Call MD NNP PA CNM   CBG monitoring, ED     Status: Abnormal   Collection Time: 12/26/21  9:20 PM  Result Value Ref Range   Glucose-Capillary 422 (H) 70 - 99 mg/dL    Comment: Glucose reference range applies only to samples taken after fasting for at least 8 hours.   Comment 1 Call MD NNP PA CNM   CBG monitoring, ED     Status: Abnormal   Collection Time: 12/26/21 10:04 PM  Result Value Ref Range   Glucose-Capillary 323 (H) 70 - 99 mg/dL    Comment: Glucose reference range applies only to samples taken after fasting for at least 8 hours.   Comment 1 Call MD NNP PA CNM    Objective  Body mass index is 26.49 kg/m. Wt Readings  from Last 3 Encounters:  02/11/22 174 lb 3.2 oz (79 kg)  12/26/21 174 lb 6.1 oz (79.1 kg)  12/25/21 174 lb 9.6 oz (79.2 kg)   Temp Readings from Last 3 Encounters:  02/11/22 98.1 F (36.7 C) (Oral)  12/26/21 97.6 F (36.4 C) (Oral)  12/25/21 97.7 F (36.5 C) (Oral)   BP Readings from Last 3 Encounters:  02/11/22 (!) 110/50  12/26/21 (!) 142/79  12/25/21 110/60   Pulse Readings from Last 3 Encounters:  02/11/22 73  12/26/21 66  12/25/21 71    Physical Exam Vitals and nursing note reviewed.  Constitutional:      Appearance: Normal appearance. He is well-developed and well-groomed.  HENT:     Head: Normocephalic and atraumatic.  Eyes:     Conjunctiva/sclera: Conjunctivae normal.     Pupils: Pupils are equal, round, and reactive to light.  Cardiovascular:     Rate and Rhythm: Normal rate and regular rhythm.     Heart sounds: Normal heart sounds.  Pulmonary:     Effort: Pulmonary effort is normal. No respiratory distress.     Breath sounds: Normal breath  sounds.  Abdominal:     Tenderness: There is no abdominal tenderness.  Skin:    General: Skin is warm and moist.  Neurological:     General: No focal deficit present.     Mental Status: He is alert and oriented to person, place, and time. Mental status is at baseline.     Sensory: Sensation is intact.     Motor: Motor function is intact.     Coordination: Coordination is intact.     Gait: Gait is intact. Gait normal.  Psychiatric:        Attention and Perception: Attention and perception normal.        Mood and Affect: Mood and affect normal.        Speech: Speech normal.        Behavior: Behavior normal. Behavior is cooperative.        Thought Content: Thought content normal.        Cognition and Memory: Cognition and memory normal.        Judgment: Judgment normal.     Assessment  Plan  Hypertension associated with diabetes ([place]) - Plan: Basic Metabolic Panel (BMET), Hemoglobin A1c, CANCELED: POCT  glycosylated hemoglobin (Hb A1C) ozempic 0.25 weekly Norvasc 2.5 mg qd  Hctz 25 mg qd  Losartan 100 mg qd  Crestor 10 mg qd   Elevated serum creatinine - Plan: Basic Metabolic Panel (BMET)  Tinea pedis of right foot - Plan: fluconazole (DIFLUCAN) 150 MG tablet  Jock itch - Plan: fluconazole (DIFLUCAN) 150 MG tablet, DISCONTINUED: fluconazole (DIFLUCAN) 150 MG tablet  HM Declines flu shot Tdap per pt had at [facility] and get records  -if cant find will need again  Consider shingrix vaccine in future Consider pna 23 in future   MMR immune Consider hep B vaccine  PSA do today Colonoscopy in future currently no insurance     Provider: Dr. Olivia Mackie McLean-Scocuzza-Internal Medicine

## 2022-02-12 ENCOUNTER — Other Ambulatory Visit: Payer: Self-pay

## 2022-02-12 ENCOUNTER — Other Ambulatory Visit: Payer: Self-pay | Admitting: Internal Medicine

## 2022-02-12 ENCOUNTER — Telehealth: Payer: Self-pay

## 2022-02-12 DIAGNOSIS — I152 Hypertension secondary to endocrine disorders: Secondary | ICD-10-CM

## 2022-02-12 DIAGNOSIS — E1165 Type 2 diabetes mellitus with hyperglycemia: Secondary | ICD-10-CM

## 2022-02-12 LAB — BASIC METABOLIC PANEL
BUN: 16 mg/dL (ref 6–23)
CO2: 27 mEq/L (ref 19–32)
Calcium: 9.4 mg/dL (ref 8.4–10.5)
Chloride: 102 mEq/L (ref 96–112)
Creatinine, Ser: 0.97 mg/dL (ref 0.40–1.50)
GFR: 86.12 mL/min (ref >60.00)
Glucose, Bld: 254 mg/dL — ABNORMAL HIGH (ref 70–99)
Potassium: 3.9 mEq/L (ref 3.5–5.1)
Sodium: 138 mEq/L (ref 135–145)

## 2022-02-12 LAB — HEMOGLOBIN A1C: Hgb A1c MFr Bld: 14.2 % — ABNORMAL HIGH (ref 4.6–6.5)

## 2022-02-12 MED ORDER — SEMAGLUTIDE(0.25 OR 0.5MG/DOS) 2 MG/3ML ~~LOC~~ SOPN
0.5000 mg | PEN_INJECTOR | SUBCUTANEOUS | 5 refills | Status: DC
  Filled 2022-02-12 (×2): qty 3, 28d supply, fill #0

## 2022-02-12 MED ORDER — "PEN NEEDLES 5/16"" 31G X 8 MM MISC"
1.0000 | Freq: Every day | 3 refills | Status: AC
  Filled 2022-02-12 – 2022-02-13 (×2): qty 100, 90d supply, fill #0

## 2022-02-12 MED ORDER — INSULIN DEGLUDEC 100 UNIT/ML ~~LOC~~ SOPN
10.0000 [IU] | PEN_INJECTOR | Freq: Every day | SUBCUTANEOUS | 2 refills | Status: DC
  Filled 2022-02-12: qty 9, 90d supply, fill #0

## 2022-02-12 NOTE — Telephone Encounter (Signed)
Lvm for pt to return call in regards to lab results.  Per Dr.Tracy: A1c 14.2 increase ozempic 0.5 weekly  Is pt agreeable to start insulin  Sent to [facility] pharmacy pt will need assistance

## 2022-02-13 ENCOUNTER — Other Ambulatory Visit: Payer: Self-pay | Admitting: Internal Medicine

## 2022-02-13 ENCOUNTER — Other Ambulatory Visit: Payer: Self-pay

## 2022-02-13 DIAGNOSIS — I152 Hypertension secondary to endocrine disorders: Secondary | ICD-10-CM

## 2022-02-13 DIAGNOSIS — E1159 Type 2 diabetes mellitus with other circulatory complications: Secondary | ICD-10-CM

## 2022-02-13 DIAGNOSIS — E1165 Type 2 diabetes mellitus with hyperglycemia: Secondary | ICD-10-CM

## 2022-02-13 MED ORDER — TRESIBA FLEXTOUCH 200 UNIT/ML ~~LOC~~ SOPN
10.0000 [IU] | PEN_INJECTOR | Freq: Every day | SUBCUTANEOUS | 2 refills | Status: AC
  Filled 2022-02-13: qty 9, 30d supply, fill #0

## 2022-02-13 MED ORDER — SEMAGLUTIDE(0.25 OR 0.5MG/DOS) 2 MG/3ML ~~LOC~~ SOPN
0.5000 mg | PEN_INJECTOR | SUBCUTANEOUS | 5 refills | Status: AC
  Filled 2022-02-13: qty 3, 28d supply, fill #0
  Filled 2022-04-30 (×3): qty 3, 28d supply, fill #1

## 2022-02-13 NOTE — Telephone Encounter (Signed)
Patient states he checked with [facility] and there is an issue with him getting his insulin.  Patient states he would like to speak with Essie Hart, CMA.  I transferred patient to [place].

## 2022-02-13 NOTE — Telephone Encounter (Signed)
Pt called about insulin medication not being at the pharmacy. I transferred call to the cma

## 2022-02-13 NOTE — Telephone Encounter (Signed)
Patient called and said he went to [facility] and spoke to [place] . Danford Bad said that the order was canceled and could not give patient insulin. Please call patient.

## 2022-03-24 ENCOUNTER — Emergency Department: Payer: Self-pay

## 2022-03-24 ENCOUNTER — Encounter: Payer: Self-pay | Admitting: Emergency Medicine

## 2022-03-24 ENCOUNTER — Emergency Department
Admission: EM | Admit: 2022-03-24 | Discharge: 2022-03-24 | Disposition: A | Discharge status: Home / Self Care | Payer: Self-pay | Attending: Emergency Medicine | Admitting: Emergency Medicine

## 2022-03-24 DIAGNOSIS — I10 Essential (primary) hypertension: Secondary | ICD-10-CM | POA: Diagnosis not present

## 2022-03-24 DIAGNOSIS — H44513 Absolute glaucoma, bilateral: Secondary | ICD-10-CM | POA: Insufficient documentation

## 2022-03-24 DIAGNOSIS — R040 Epistaxis: Secondary | ICD-10-CM | POA: Insufficient documentation

## 2022-03-24 DIAGNOSIS — Y92410 Unspecified street and highway as the place of occurrence of the external cause: Secondary | ICD-10-CM | POA: Insufficient documentation

## 2022-03-24 DIAGNOSIS — E119 Type 2 diabetes mellitus without complications: Secondary | ICD-10-CM | POA: Insufficient documentation

## 2022-03-24 DIAGNOSIS — H409 Unspecified glaucoma: Secondary | ICD-10-CM

## 2022-03-24 LAB — CBC WITH DIFFERENTIAL/PLATELET
Abs Immature Granulocytes: 0.03 10*3/uL (ref 0.00–0.07)
Basophils Absolute: 0 10*3/uL (ref 0.0–0.1)
Basophils Relative: 0 %
Eosinophils Absolute: 0.1 10*3/uL (ref 0.0–0.5)
Eosinophils Relative: 1 %
HCT: 46.4 % (ref 39.0–52.0)
Hemoglobin: 14.7 g/dL (ref 13.0–17.0)
Immature Granulocytes: 0 %
Lymphocytes Relative: 15 %
Lymphs Abs: 1.9 10*3/uL (ref 0.7–4.0)
MCH: 25.9 pg — ABNORMAL LOW (ref 26.0–34.0)
MCHC: 31.7 g/dL (ref 30.0–36.0)
MCV: 81.8 fL (ref 80.0–100.0)
Monocytes Absolute: 0.6 10*3/uL (ref 0.1–1.0)
Monocytes Relative: 5 %
Neutro Abs: 9.8 10*3/uL — ABNORMAL HIGH (ref 1.7–7.7)
Neutrophils Relative %: 79 %
Platelets: 285 10*3/uL (ref 150–400)
RBC: 5.67 MIL/uL (ref 4.22–5.81)
RDW: 14.9 % (ref 11.5–15.5)
WBC: 12.5 10*3/uL — ABNORMAL HIGH (ref 4.0–10.5)
nRBC: 0 % (ref 0.0–0.2)

## 2022-03-24 LAB — BASIC METABOLIC PANEL
Anion gap: 6 (ref 5–15)
BUN: 17 mg/dL (ref 6–20)
CO2: 25 mmol/L (ref 22–32)
Calcium: 8.9 mg/dL (ref 8.9–10.3)
Chloride: 107 mmol/L (ref 98–111)
Creatinine, Ser: 1.05 mg/dL (ref 0.61–1.24)
GFR, Estimated: >60 mL/min (ref >60)
Glucose, Bld: 127 mg/dL — ABNORMAL HIGH (ref 70–99)
Potassium: 3.7 mmol/L (ref 3.5–5.1)
Sodium: 138 mmol/L (ref 135–145)

## 2022-03-24 MED ORDER — TETRACAINE HCL 0.5 % OP SOLN
2.0000 [drp] | Freq: Once | OPHTHALMIC | Status: AC
  Administered 2022-03-24: 2 [drp] via OPHTHALMIC
  Filled 2022-03-24: qty 4

## 2022-03-24 MED ORDER — ACETAZOLAMIDE 250 MG PO TABS
500.0000 mg | ORAL_TABLET | Freq: Once | ORAL | Status: AC
  Administered 2022-03-24: 500 mg via ORAL
  Filled 2022-03-24: qty 2

## 2022-03-24 MED ORDER — ACETAMINOPHEN 325 MG PO TABS
650.0000 mg | ORAL_TABLET | Freq: Once | ORAL | Status: AC
  Administered 2022-03-24: 650 mg via ORAL
  Filled 2022-03-24: qty 2

## 2022-03-24 MED ORDER — FLUORESCEIN SODIUM 1 MG OP STRP
1.0000 | ORAL_STRIP | Freq: Once | OPHTHALMIC | Status: AC
  Administered 2022-03-24: 1 via OPHTHALMIC
  Filled 2022-03-24: qty 1

## 2022-03-24 MED ORDER — IOHEXOL 300 MG/ML  SOLN
100.0000 mL | Freq: Once | INTRAMUSCULAR | Status: AC | PRN
Start: 2022-03-24 — End: 2022-03-24
  Administered 2022-03-24: 100 mL via INTRAVENOUS
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
## 2022-03-24 NOTE — Discharge Instructions (Signed)
Take the acetazolamide tomorrow morning as soon as you wake up.  Please go to the ophthalmology clinic for an 8 AM appointment.  Dr. Brooke Dare, the ophthalmologist, is expecting you.  Please return for any new, worsening, or change in symptoms or other concerns.  It was a pleasure caring for you today.
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
## 2022-03-27 ENCOUNTER — Other Ambulatory Visit: Payer: Self-pay

## 2022-03-27 MED ORDER — CYCLOPENTOLATE HCL 1 % OP SOLN
OPHTHALMIC | 1 refills | Status: AC
  Filled 2022-03-27: qty 2, 15d supply, fill #0
  Filled 2022-04-30: qty 2, 15d supply, fill #1

## 2022-03-27 MED ORDER — TIMOLOL MALEATE 0.5 % OP SOLN
OPHTHALMIC | 3 refills | Status: AC
Start: 2022-03-27 — End: ?
  Filled 2022-03-27: qty 10, 37d supply, fill #0
  Filled 2022-04-30: qty 10, 37d supply, fill #1

## 2022-03-27 MED ORDER — PREDNISOLONE ACETATE 1 % OP SUSP
OPHTHALMIC | 1 refills | Status: DC
  Filled 2022-03-27: qty 5, 10d supply, fill #0
  Filled 2022-04-30: qty 5, 30d supply, fill #1

## 2022-04-01 ENCOUNTER — Other Ambulatory Visit: Payer: Self-pay

## 2022-04-01 MED ORDER — BRIMONIDINE TARTRATE 0.2 % OP SOLN
1.0000 [drp] | Freq: Two times a day (BID) | OPHTHALMIC | 5 refills | Status: AC
  Filled 2022-04-01: qty 5, 30d supply, fill #0
  Filled 2022-04-30 (×4): qty 5, 30d supply, fill #1

## 2022-04-02 ENCOUNTER — Other Ambulatory Visit: Payer: Self-pay

## 2022-04-03 ENCOUNTER — Other Ambulatory Visit: Payer: Self-pay

## 2022-04-30 ENCOUNTER — Other Ambulatory Visit: Payer: Self-pay

## 2022-04-30 MED ORDER — PREDNISOLONE ACETATE 1 % OP SUSP
OPHTHALMIC | 1 refills | Status: AC
Start: 2022-04-30 — End: ?
  Filled 2022-04-30: qty 5, 25d supply, fill #0

## 2022-05-01 ENCOUNTER — Other Ambulatory Visit: Payer: Self-pay

## 2022-05-14 ENCOUNTER — Encounter: Payer: Self-pay | Admitting: Family Medicine

## 2022-08-17 ENCOUNTER — Other Ambulatory Visit: Payer: Self-pay

## 2023-10-06 IMAGING — CR DG ELBOW COMPLETE 3+V*R*
4 series · 4 of 4 positions shown · non-contrast
Comparison: None.

CLINICAL DATA: Pain. Swelling

EXAM:
RIGHT ELBOW - COMPLETE 3+ VIEW

[elbow ap]
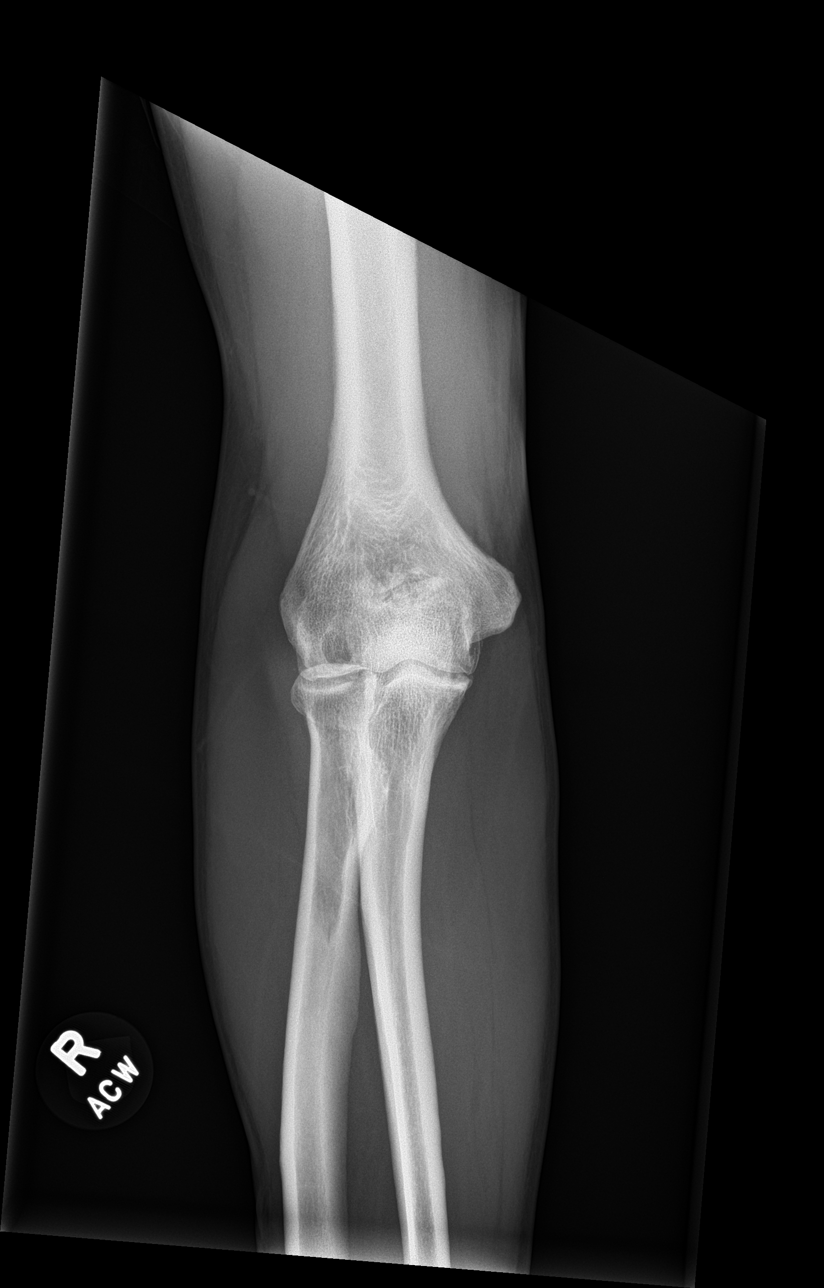

[elbow obl (1 of 2)]
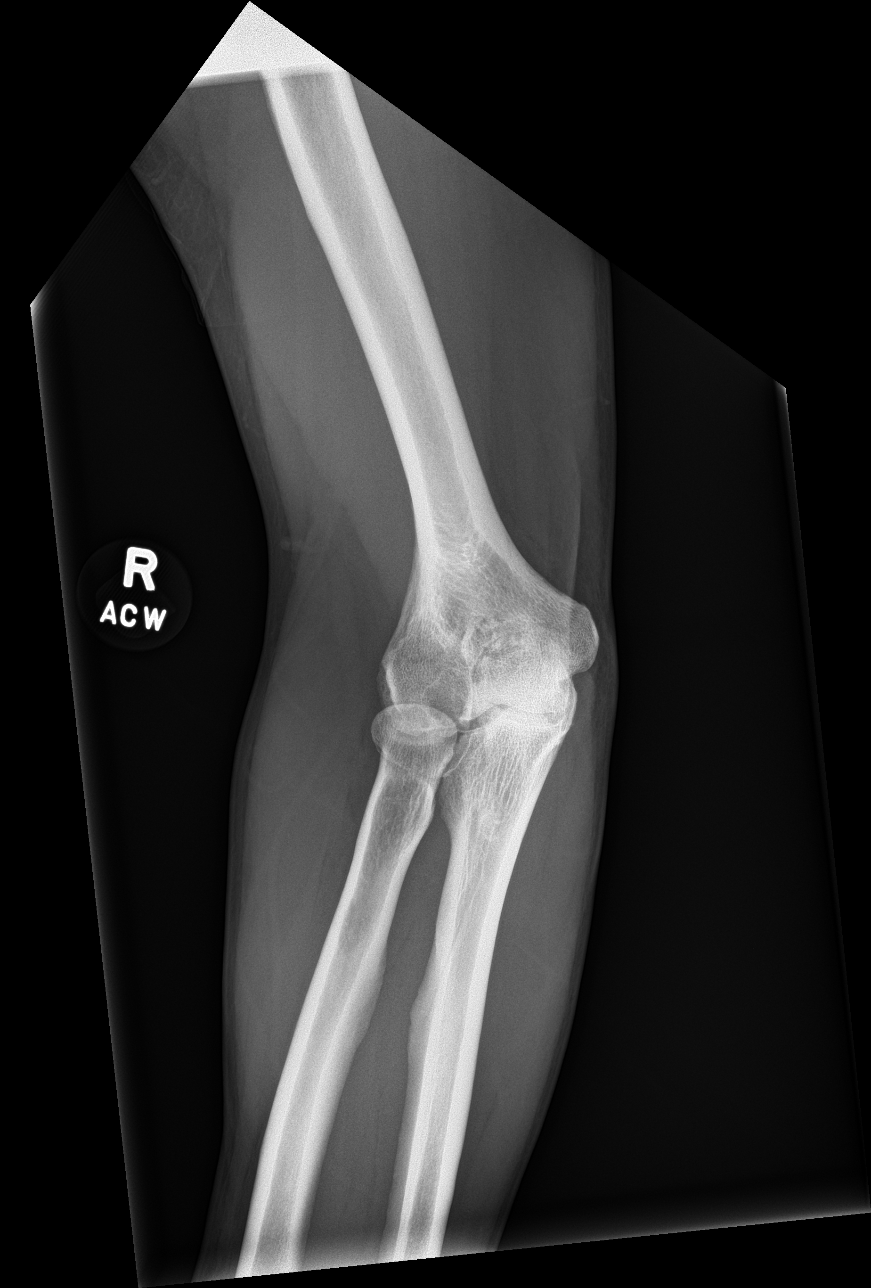

[elbow obl (2 of 2)]
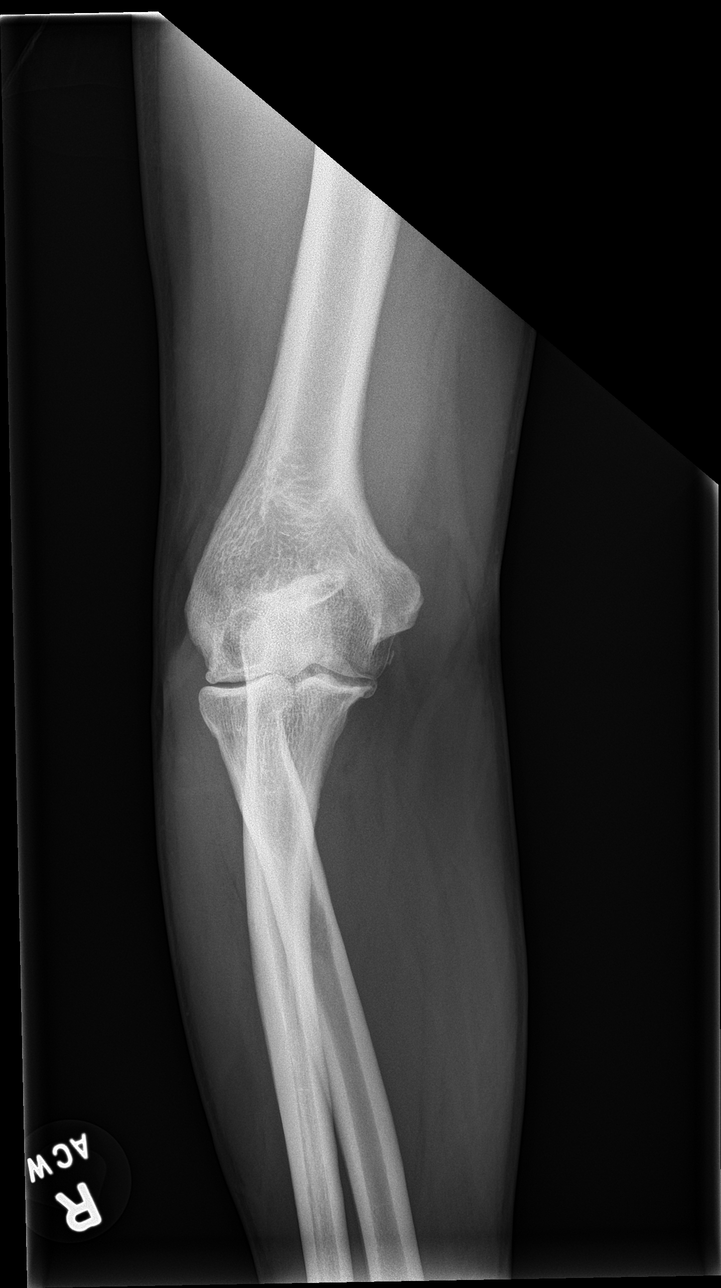

[elbow lat]
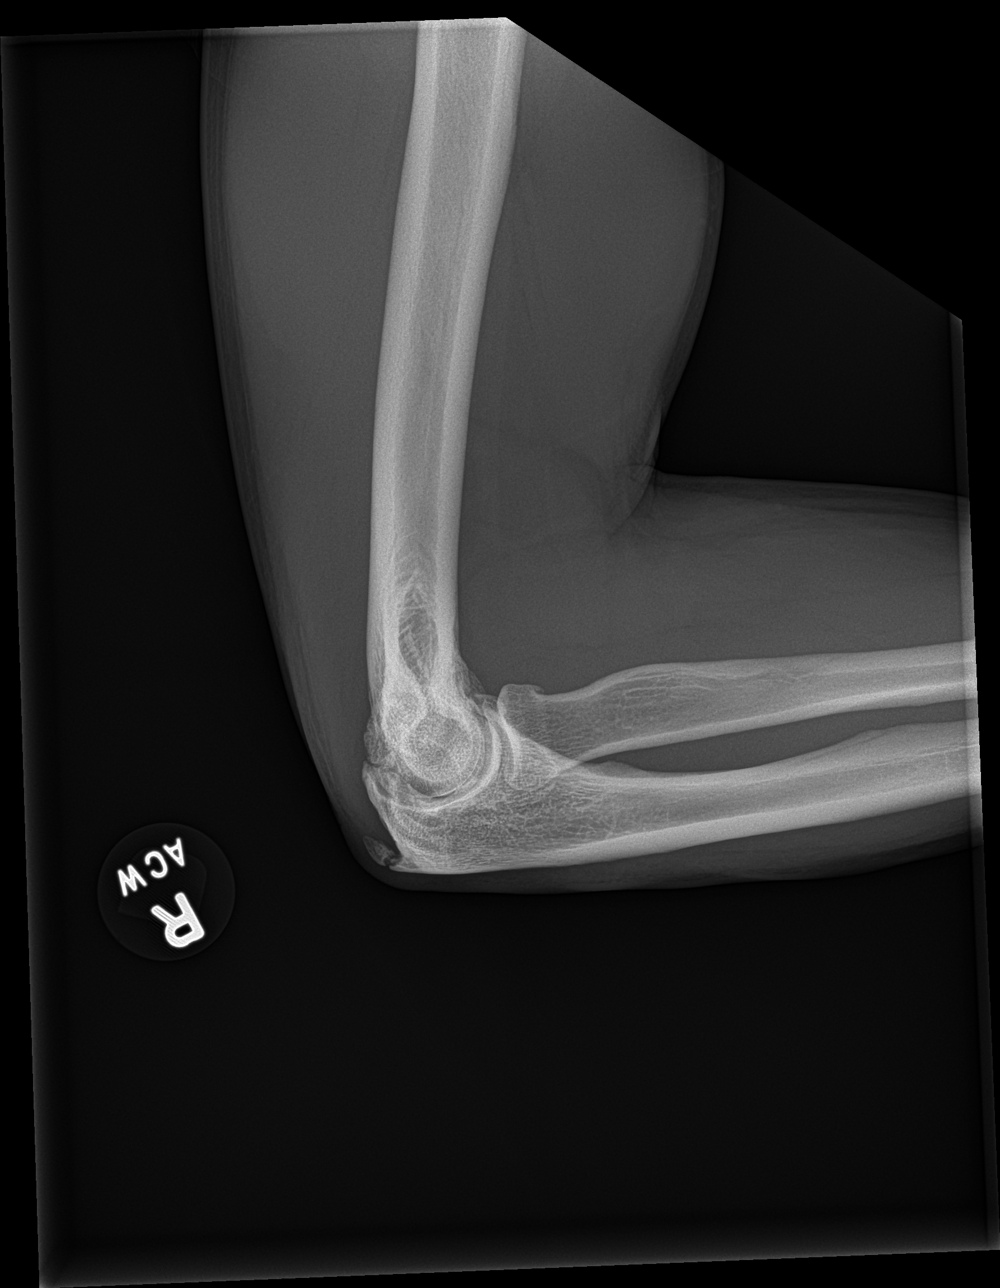

[4 of 4 positions shown; findings below may reference images not displayed]

FINDINGS: There is no evidence of fracture, dislocation, or joint effusion.
Degenerative changes of the elbow joint. No aggressive appearing
focal bone abnormality. Soft tissues are unremarkable.
IMPRESSION: No acute displaced fracture or dislocation.
